# Patient Record
Sex: Male | Born: 1945
Health system: Southern US, Community
[De-identification: ages and names within clinical notes are randomized; demographics above are authoritative.]

## PROBLEM LIST (undated history)

## (undated) DIAGNOSIS — T8859XA Other complications of anesthesia, initial encounter: Secondary | ICD-10-CM

## (undated) DIAGNOSIS — Z8711 Personal history of peptic ulcer disease: Secondary | ICD-10-CM

## (undated) DIAGNOSIS — I7771 Dissection of carotid artery: Secondary | ICD-10-CM

## (undated) DIAGNOSIS — F4310 Post-traumatic stress disorder, unspecified: Secondary | ICD-10-CM

## (undated) DIAGNOSIS — M6208 Separation of muscle (nontraumatic), other site: Secondary | ICD-10-CM

## (undated) DIAGNOSIS — I3139 Other pericardial effusion (noninflammatory): Secondary | ICD-10-CM

## (undated) DIAGNOSIS — I714 Abdominal aortic aneurysm, without rupture, unspecified: Secondary | ICD-10-CM

## (undated) DIAGNOSIS — Z8719 Personal history of other diseases of the digestive system: Secondary | ICD-10-CM

## (undated) DIAGNOSIS — R059 Cough, unspecified: Secondary | ICD-10-CM

## (undated) DIAGNOSIS — I2510 Atherosclerotic heart disease of native coronary artery without angina pectoris: Secondary | ICD-10-CM

## (undated) DIAGNOSIS — K5730 Diverticulosis of large intestine without perforation or abscess without bleeding: Secondary | ICD-10-CM

## (undated) DIAGNOSIS — K219 Gastro-esophageal reflux disease without esophagitis: Secondary | ICD-10-CM

## (undated) DIAGNOSIS — M549 Dorsalgia, unspecified: Secondary | ICD-10-CM

## (undated) DIAGNOSIS — Z972 Presence of dental prosthetic device (complete) (partial): Secondary | ICD-10-CM

## (undated) DIAGNOSIS — E785 Hyperlipidemia, unspecified: Secondary | ICD-10-CM

## (undated) DIAGNOSIS — J439 Emphysema, unspecified: Secondary | ICD-10-CM

## (undated) DIAGNOSIS — R05 Cough: Secondary | ICD-10-CM

## (undated) DIAGNOSIS — T4145XA Adverse effect of unspecified anesthetic, initial encounter: Secondary | ICD-10-CM

## (undated) DIAGNOSIS — Z86010 Personal history of colonic polyps: Secondary | ICD-10-CM

## (undated) DIAGNOSIS — M810 Age-related osteoporosis without current pathological fracture: Secondary | ICD-10-CM

## (undated) DIAGNOSIS — F319 Bipolar disorder, unspecified: Secondary | ICD-10-CM

## (undated) DIAGNOSIS — F419 Anxiety disorder, unspecified: Secondary | ICD-10-CM

## (undated) DIAGNOSIS — Z8659 Personal history of other mental and behavioral disorders: Secondary | ICD-10-CM

## (undated) DIAGNOSIS — G8929 Other chronic pain: Secondary | ICD-10-CM

## (undated) DIAGNOSIS — M1990 Unspecified osteoarthritis, unspecified site: Secondary | ICD-10-CM

## (undated) DIAGNOSIS — I779 Disorder of arteries and arterioles, unspecified: Secondary | ICD-10-CM

## (undated) DIAGNOSIS — C189 Malignant neoplasm of colon, unspecified: Secondary | ICD-10-CM

## (undated) HISTORY — DX: Disorder of arteries and arterioles, unspecified: I77.9

## (undated) HISTORY — DX: Gastro-esophageal reflux disease without esophagitis: K21.9

## (undated) HISTORY — DX: Separation of muscle (nontraumatic), other site: M62.08

## (undated) HISTORY — DX: Dissection of carotid artery: I77.71

## (undated) HISTORY — PX: COLONOSCOPY: SHX174

## (undated) HISTORY — PX: BACK SURGERY: SHX140

## (undated) HISTORY — DX: Age-related osteoporosis without current pathological fracture: M81.0

## (undated) HISTORY — DX: Other pericardial effusion (noninflammatory): I31.39

## (undated) HISTORY — PX: APPENDECTOMY: SHX54

## (undated) HISTORY — DX: Atherosclerotic heart disease of native coronary artery without angina pectoris: I25.10

## (undated) HISTORY — DX: Hyperlipidemia, unspecified: E78.5

## (undated) HISTORY — DX: Abdominal aortic aneurysm, without rupture, unspecified: I71.40

---

## 2000-07-11 ENCOUNTER — Inpatient Hospital Stay (HOSPITAL_COMMUNITY): Admission: EM | Admit: 2000-07-11 | Discharge: 2000-07-12 | Payer: Self-pay | Admitting: Emergency Medicine

## 2000-07-11 ENCOUNTER — Encounter: Payer: Self-pay | Admitting: Emergency Medicine

## 2002-01-13 ENCOUNTER — Ambulatory Visit (HOSPITAL_COMMUNITY): Admission: RE | Admit: 2002-01-13 | Discharge: 2002-01-13 | Payer: Self-pay | Admitting: Gastroenterology

## 2002-01-13 ENCOUNTER — Encounter: Payer: Self-pay | Admitting: Gastroenterology

## 2003-02-01 ENCOUNTER — Encounter: Payer: Self-pay | Admitting: Family Medicine

## 2003-02-01 ENCOUNTER — Ambulatory Visit (HOSPITAL_COMMUNITY): Admission: RE | Admit: 2003-02-01 | Discharge: 2003-02-01 | Payer: Self-pay | Admitting: Family Medicine

## 2003-04-17 ENCOUNTER — Encounter: Payer: Self-pay | Admitting: Neurosurgery

## 2003-04-18 ENCOUNTER — Encounter: Payer: Self-pay | Admitting: Neurosurgery

## 2003-04-18 ENCOUNTER — Inpatient Hospital Stay (HOSPITAL_COMMUNITY): Admission: RE | Admit: 2003-04-18 | Discharge: 2003-04-19 | Payer: Self-pay | Admitting: Neurosurgery

## 2003-04-18 HISTORY — PX: ANTERIOR CERVICAL DECOMP/DISCECTOMY FUSION: SHX1161

## 2004-06-04 ENCOUNTER — Ambulatory Visit (HOSPITAL_COMMUNITY): Admission: RE | Admit: 2004-06-04 | Discharge: 2004-06-04 | Payer: Self-pay | Admitting: Gastroenterology

## 2004-06-04 ENCOUNTER — Encounter (INDEPENDENT_AMBULATORY_CARE_PROVIDER_SITE_OTHER): Payer: Self-pay | Admitting: Specialist

## 2004-10-22 ENCOUNTER — Ambulatory Visit: Payer: Self-pay

## 2005-03-05 ENCOUNTER — Ambulatory Visit (HOSPITAL_COMMUNITY): Admission: RE | Admit: 2005-03-05 | Discharge: 2005-03-05 | Payer: Self-pay | Admitting: Urology

## 2005-03-05 ENCOUNTER — Ambulatory Visit (HOSPITAL_BASED_OUTPATIENT_CLINIC_OR_DEPARTMENT_OTHER): Admission: RE | Admit: 2005-03-05 | Discharge: 2005-03-05 | Payer: Self-pay | Admitting: Urology

## 2005-03-05 HISTORY — PX: CIRCUMCISION: SUR203

## 2005-09-03 ENCOUNTER — Emergency Department (HOSPITAL_COMMUNITY): Admission: EM | Admit: 2005-09-03 | Discharge: 2005-09-03 | Payer: Self-pay | Admitting: Emergency Medicine

## 2005-09-06 ENCOUNTER — Ambulatory Visit (HOSPITAL_COMMUNITY): Admission: RE | Admit: 2005-09-06 | Discharge: 2005-09-06 | Payer: Self-pay | Admitting: Orthopaedic Surgery

## 2007-12-14 ENCOUNTER — Emergency Department (HOSPITAL_COMMUNITY): Admission: EM | Admit: 2007-12-14 | Discharge: 2007-12-14 | Payer: Self-pay | Admitting: Emergency Medicine

## 2008-07-04 ENCOUNTER — Encounter: Admission: RE | Admit: 2008-07-04 | Discharge: 2008-07-04 | Payer: Self-pay | Admitting: Orthopaedic Surgery

## 2009-06-03 ENCOUNTER — Emergency Department (HOSPITAL_COMMUNITY): Admission: EM | Admit: 2009-06-03 | Discharge: 2009-06-03 | Payer: Self-pay | Admitting: Emergency Medicine

## 2011-05-02 NOTE — Op Note (Signed)
NAME:  Matthew [name], Matthew [name]                         ACCOUNT NO.:  [id number]   MEDICAL RECORD NO.:  [id number]                   PATIENT TYPE:  AMB   LOCATION:  ENDO                                 FACILITY:  [facility]   PHYSICIAN:  John C. Madilyn Fireman, M.D.                 DATE OF BIRTH:  1946/08/24   DATE OF PROCEDURE:  06/04/2004  DATE OF DISCHARGE:                                 OPERATIVE REPORT   PROCEDURE:  Colonoscopy.   INDICATIONS FOR PROCEDURE:  Average-risk colon cancer screening.   DESCRIPTION OF PROCEDURE:  The patient was placed in the left lateral  decubitus position and placed on the pulse monitor with continuous low-flow  oxygen delivered by nasal cannula.  He was sedated with 75 mcg IV fentanyl  and 6 mg IV Versed.  The Olympus video colonoscope was inserted into the  rectum and advanced to the cecum, confirmed by transillumination of  McBurney's point and visualization of the ileocecal valve and appendiceal  orifice.  Prep was excellent.  The cecum and ascending colon appeared normal  with no masses, polyps, diverticula, or other mucosal abnormalities.  Within  the transverse, descending and sigmoid colon, there was seen a total of five  polyps, all of which were sessile ranging in size from 0.5 to 1.3 cm and  these were removed by both snare and hot biopsy.  There were also numerous  scattered sigmoid diverticula.  The rectum appeared normal and retroflexed  view of the anus revealed no obvious internal hemorrhoids.  The scope was  then withdrawn and the patient returned to the recovery room in stable  condition.  He tolerated the procedure well and there were no immediate  complications.   IMPRESSION:  1. Transverse descending sigmoid colon polyps.  2. Diverticulosis.   PLAN:  Await histology and we will probably repeat colonoscopy in three  years.  Manson Passey __________ [facility].                                               John C. Madilyn Fireman, M.D.    JCH/MEDQ  D:   06/04/2004  T:  06/04/2004  Job:  [id number]

## 2011-05-02 NOTE — Op Note (Signed)
[name], Matthew [name]               ACCOUNT NO.:  [id number]   MEDICAL RECORD NO.:  [id number]          PATIENT TYPE:  AMB   LOCATION:  [facility]                         FACILITY:  [facility]   PHYSICIAN:  Maretta Bees. Vonita Moss, M.D.DATE OF BIRTH:  10-28-1946   DATE OF PROCEDURE:  03/05/2005  DATE OF DISCHARGE:                                 OPERATIVE REPORT   PREOPERATIVE DIAGNOSIS:  Recurrent balanoposthitis.   POSTOPERATIVE DIAGNOSIS:  Recurrent balanoposthitis.   PROCEDURE:  Circumcision.   SURGEON:  Maretta Bees. Vonita Moss, M.D.   ANESTHESIA:  General.   INDICATIONS:  This is a 65 year old gentleman who has had several months of  cracking and splitting of the foreskin, recurrent yeast infections.  Was  seen by me and recommended that he have a circumcision to prevent this  recurrent problem.  He was advised about the risk of bleeding and restricted  sexual activity until the wound is healed.   PROCEDURE:  The patient is brought to the operating room and placed in  supine position.  The external genitalia were prepped and draped in the  usual fashion.  Circumcision is performed using the sleeve technique.  After  the foreskin was removed, Marcaine was injected under the residual skin  shaft proximally.  Bleeders were coagulated with electrocautery.  The  frenular area was closed with running 4-0 chromic cat gut.  The distal edge  of the skin of the shaft was reapproximated at the residual edge of mucosal  foreskin at 3, 6, 9, and 12 o'clock with 4-0 chromic cat gut and running 4-0  chromic cat gut was placed between these quadrant sutures.  The wound was  cleaned and dressed with Vaseline gauze dry sterile gauze dressings and  Coban.  He was taken to the recovery room in good condition, having  tolerated the procedure well.      LJP/MEDQ  D:  03/05/2005  T:  03/05/2005  Job:  [id number]   cc:   Olena Leatherwood [facility]

## 2011-05-02 NOTE — Consult Note (Signed)
Weaver. Matthew [name]  Patient:    Matthew [name], Matthew [name]                      MRN: [id number] Proc. Date: 07/11/00 Adm. Date:  [id number] Disc. Date: [id number] Attending:  Mickie Hillier CC:         Matthew [name], M.D.                          Consultation Report  REASON FOR CONSULTATION:  Chest pain.  IMPRESSION: 1. Atypical angina, possible unstable angina (spontaneous chest tightness,    nonexertional). 2. Tobacco abuse, middle-aged male. 3. History of hypercholesterolemia "diet" treated. 4. Status post lumbar spine surgery x 3. 5. History of peptic ulcer disease treated 12 years ago. 6. Diverticulitis.  RECOMMENDATIONS: 1. Aspirin four 81 mg tablets chewable now, then coated 325 mg p.o. q.d. 2. ECG with any chest pain. 3. Begin Lovenox and IV nitroglycerin if patient develops recurrent pain. 4. Agree with high-dose Prevacid. 5. If develops recurrent pain with significant ECG changes indicative of    ischemia and/or elevated CK-MB/troponin will proceed with catheterization. 6. Otherwise will undergo exercise stress testing +/- myocardial perfusion    ______.  FINDINGS:  This gentleman is a 65 year old white male who has had episodes of anterior chest tightness, spontaneous in nature occurring every day or every other day for the past month.  Today he has had three such episodes.  They are nonexertional.  They are associated with mild dyspnea, diaphoresis and subsequently weakness.  He also has episodes of blurred vision associated with these and some cephalgia.  He states "I feel like I am going to pass out." Earlier today he went to the [name] where an ECG and chest x-ray were unremarkable.  He was referred to [facility] Emergency Room and is now being admitted for further evaluation.  There does not appear to be any objective evidence at this point of coronary ischemia.  PAST MEDICAL HISTORY:  As above.  MEDICATIONS:  None.  DRUG  ALLERGIES:  CODEINE causes GI distress, IBUPROFEN causes urticaria.  FAMILY HISTORY:  Mother died age 71, had congestive failure.  One sister had "some sort of heart problem."  Father died in 58.  He has another sister whose health he is unaware of.  SOCIAL HISTORY:  He smokes a pack of cigarettes daily.  Denies any ethanol abuse.  He is a [employer].  He is accompanied in the emergency room this evening with his wife.  REVIEW OF SYSTEMS:  No recent fever, chills, weight loss, weight gain, malaise.  Denies headache except as above.  No visual changes except as above. He has hearing in both ears.  No tinnitus.  He has all his own teeth.  No difficulty with swallowing.  No frequent sore throat.  No cough or hemoptysis. Denies any problem with wheezing.  He has not had orthopnea, PND or lower extremity edema.  No tachy palpitations or syncope.  He denies any melena, hematochezia, hematemesis.  No chronic abdominal pain.  He denies any dysuria, hematuria, or nocturia.  No difficulty starting or stopping his stream.  No pain in the major joints.  He does have occasionally mechanical low-back pain. No muscle weakness.  No dysesthesia or paresthesia.  He has never had a seizure or never been treated for any emotional disorder.  PHYSICAL EXAMINATION:  VITAL SIGNS:  The blood pressure is 148/88,  pulse 66 and regular, respirations 16, temperature 97.1.  O2 saturation 98% on room air.  GENERAL:  In general this is a 65 year old, pleasant white male in no acute distress.  HEENT:  Unremarkable.  The head is normocephalic and atraumatic.  Pupils, equal, round, reactive to light and accommodation.  Extraocular movements intact.  Sclerae nonicteric.  Tympanic membranes are intact.  Oral mucosa is pink and moist.  The tongue is not coated.  NECK:  Supple without thyromegaly or masses.  The carotid upstrokes are normal.  There is no bruit.  There is no jugular venous distention.  CHEST:   Clear with adequate excursion bilaterally.  Fascicular breath sounds are heard throughout.  The precordium is quiet.  Normal S1 and S2.  No S3, S4, murmur, click or rub noted.  The point of maximum intensity is not palpable.  ABDOMEN:  Soft, flat, and nontender.  No hepatosplenomegaly or midline pulsatile masses.  Bowel sounds are present in all quadrants.  GENITALIA:  Normal male phallus.  Descended testicles.  No lesions.  RECTAL:  Examination not performed.  EXTREMITIES:  Full range of motion.  No edema.  Intact distal pulses.  No bruits noted.  SKIN:  Warm, dry and clear.  NEUROLOGICAL:  Cranial nerves II-XII are intact.  Motor and sensory are grossly intact.  Gait is not tested.  ACCESSORY CLINICAL DATA:  Electrocardiogram shows sinus rhythm and a T wave inversion in aVL.  No prior tracing for comparison.  Chest x-ray is unremarkable.  Troponins less than 0.03.  CK is 109, MB 1.3.  Alcohol level less than 10. Urinalysis normal.  PTT is 27, PT 12.2 with an INR of 0.9.  Serum electrolytes, BUN, creatinine, and glucose are normal as are liver associated enzymes, albumin and calcium.  Hemoglobin is 13.6, hematocrit 36.9, white blood count 10,600, platelets 192,000.  COMMENTS:  At this point it is difficult to predict the etiology of Matthew [name] chest pain syndrome.  His symptoms are atypical but he does have multiple risk factors.  Certainly agree with the need for admission and rule out myocardial infarction.  Would be useful to have an ECG with an episode of this chest discomfort.  That would drive the diagnostic testing toward a more invasive evaluation.  If no further episodes of chest discomfort and no evidence of coronary ischemia on above studies, would proceed with exercise stress testing as noted above.  Thank you very much for allowing me to assist in the care of Matthew [name].  It has been a pleasure to do so.  I will discuss his further care with you. DD:   07/11/00 TD:  07/13/00 Job: [id number]

## 2011-05-02 NOTE — H&P (Signed)
[place]  Patient:    Matthew [name], Matthew [name]                      MRN: [id number] Adm. Date:  [id number] Attending:  Mickie Hillier                         History and Physical  DIAGNOSES: 1. Chest pain, rule out angina. 2. Tobacco abuse. 3. History of peptic ulcer disease. 4. Codeine and ibuprofen allergies.  HISTORY OF PRESENT ILLNESS:  Matthew [name] is a 65 year old white male admitted with chest pain.  He was in his usual state of health until about a month prior to admission.  He was having "chest pain episodes" each morning lasting for seconds or less than a minute.  Today, he had three episodes-one during lunch, at rest.  It is an anterior chest wall "tightness" associated with a feeling of faintness, last less than a minute, some diaphoresis present, nonexertional, some nausea, but no vomiting.  Cardiac risks factors of smoking a pack a day for over 30 years, history of elevated cholesterol in the past, and family history of mother dying of heart problems with CHF at age 26 and sister having a history of "heart problems" of some type.  The patient does not have diabetes or hypertension.  The patient went to the [facility] and then was sent to [facility] Emergency Room for evaluation.  Dr. Francisca December was called to see the patient and deferred admission to our practice, agreeing to consult to see patient tonight.  PAST MEDICAL/SURGICAL HISTORY:  Lumbar spine surgery x 3.  History of peptic ulcer disease about 12 years ago.  Dr. Everardo All. Madilyn Fireman performed upper endoscopy at that time.  Pneumonia at age 71.  Illness of diverticulitis.  History of tobacco abuse.  History of peptic ulcer disease.  CURRENT MEDICATIONS:  None.  ALLERGIES:  Allergy to IBUPROFEN with rash and CODEINE.  FAMILY HISTORY:  Mother died at age 39 of CHF.  Father died in MVA at age 67. Two sisters one with "undefined heart  problems."  SOCIAL HISTORY:  Married.  Drives a truck for employment.  Denies alcohol use.  Smoked a pack a day for over 30 years.  REVIEW OF SYSTEMS:  Denies history of diabetes, thyroid disease, kidney, liver, or prostate disease.  No regular aspirin use.  PHYSICAL EXAMINATION:  VITAL SIGNS:  Blood pressure 147/79, temperature 97.0.  O2 saturation 97% on room air.  GENERAL:  Alert and oriented x 3.  HEENT:  Normocephalic and atraumatic.  EOMI.  PERRLA.  Posterior pharynx clear.  The rest of the exam was clear.  HEART:  RRR.  Normal S1 and S2 without murmur, gallop, rub, or click.  ABDOMEN:  Soft, benign, nontender.  No rigidity, guarding, rebound.  No hepatosplenomegaly.  GENITAL:  Normal male.  EXTREMITIES:  Without CCE.  NEUROLOGICAL:  Intact.  SKIN:  Warm and dry.  RECTAL:  Guaiac negative per doctor in the [facility] tonight.  LABORATORY DATA:  Hemoglobin 13.6, white count 10.6.  Sodium 129, potassium 4.1, BUN 18, creatinine 1.2, glucose 104.  Liver functions normal.  Urinalysis negative pending cardiac enzymes.  EKG shows normal sinus rhythm and rate at 69, normal axis, no acute changes. Chest x-ray shows no acute disease.  ASSESSMENT: 1. Chest pain, rule out angina and coronary artery disease.  Check cardiac  enzymes.  Dr. Francisca December to see the patient for cardiology consult.    Monitor bed.  If recurrent pain, consider intravenous nitroglycerin and    heparin.  We will hold off on heparin for now with the patients    history of peptic ulcer disease in the distant past as he is not having    pain currently. 2. Tobacco abuse. 3. History of peptic ulcer disease.  Start on Prevacid and Carafate.  If    cardiac disease is ruled out and pain is unresolved, may need to consider    upper endoscopy to be repeated. 4. Codeine and ibuprofen on chart as marked. DD:  07/11/00 TD:  07/12/00 Job: [id number]

## 2011-05-02 NOTE — Op Note (Signed)
NAME:  Matthew [name], Matthew [name]                         ACCOUNT NO.:  [id number]   MEDICAL RECORD NO.:  [id number]                   PATIENT TYPE:  INP   LOCATION:  3005                                 FACILITY:  [facility]   PHYSICIAN:  Clydene Fake, M.D.               DATE OF BIRTH:  December 27, 1945   DATE OF PROCEDURE:  04/18/2003  DATE OF DISCHARGE:                                 OPERATIVE REPORT   PREOPERATIVE DIAGNOSIS:  Herniated nucleus pulposus, spondylosis at C6-7,  with left-sided radiculopathy.   POSTOPERATIVE DIAGNOSIS:  Herniated nucleus pulposus, spondylosis at C6-7,  with left-sided radiculopathy.   PROCEDURE:  Anterior cervical decompression, diskectomy, and fusion at C6-7  with BAK-C cages and autograft through the same incision at C6-7.   SURGEON:  Clydene Fake, M.D.   ASSISTANT:  Payton Doughty, M.D.   ANESTHESIA:  General endotracheal tube anesthesia.   ESTIMATED BLOOD LOSS:  Minimal.   BLOOD REPLACED:  None.   DRAINS:  None.   COMPLICATIONS:  None.   INDICATION FOR PROCEDURE:  The patient is a 65 year old gentleman who has  had neck and left arm pain, numbness, and weakness, found to have some  weakness in the left triceps and fair extension with decreased sensation in  the left 7 distribution.  MRI was done showing __________ 6-7, spondylitic  changes and anterior spurring on the left side, disk herniation compressing  the 7 root.  Patient brought in for decompression and fusion.   DESCRIPTION OF PROCEDURE:  The patient was brought in the operating room and  general anesthesia induced.  The patient was placed in 10 pounds Holter  traction and prepped and draped in a sterile fashion.  The site of incision  was injected with 10 mL of 1% lidocaine with epinephrine.  The incision was  then made from the midline to the anterior border of the sternocleidomastoid  muscle on the left side of the neck, incision taken down to the platysma.  Hemostasis was obtained with  Bovie cauterization, then the Bovie was used to  open the platysma, and blunt dissection was taken through the anterior  cervical fascia to the anterior cervical spine.  A needle was placed in a  disk space and fluoroscopy was used, confirming the 6-7 level.  The disk  space was incised with a 15 blade and partial diskectomy performed.  As the  needle was removed, the longus colli muscle was reflected laterally and  incised using the Bovie and the self-retaining retractor was placed.  Distraction pins were placed in the C6 and C7 interspace.  Distracted  osteophytes were removed with Leksell rongeur and diskectomy was performed  with pituitary rongeurs and curettes and 1 and 2 mm punches.  Bilateral  foraminotomies of that root performed.  The left one was significantly  compressed down.  This was opened up.  When we were finished, we had good  decompression of the central canal and bilateral roots.  We then measured  the interspace at 9 mm, and a 9 mm drill guide for a 12 mm BAK-C cage was  then placed into and over the disk space.  We used fluoroscopic imaging to  show we had the proper orientation and we then drilled into the interspace,  saving all bone fragments and packing them into a 12 mm BAK-C cage, a small  remnant of bone posteriorly.  All bone was packed in the cage.  The hole was  tapped.  The cage was then threaded into the interspace.  The drill tip was  removed and we counter sunk it a couple of millimeters.  We checked behind  the cage, and there was plenty of room between the cage and dura.  The wound  was irrigated with antibiotic solution, hemostasis was obtained with Gelfoam  and thrombin and bipolar cauterization.  Gelfoam was irrigated out.  The  cage was in good position.  Fl uroscopic imaging was also used to assure it  was in the 6 interspace in good position.  The retractors were removed and  hemostasis obtained with bipolar cauterization and Gelfoam and  thrombin,  Gelfoam irrigated out.  The platysma was closed with 3-0 Vicryl interrupted  suture, the suture closed with the same, the skin closed with Benzoin and  Steri-Strips.  A dressing was placed.  The patient was placed into a soft  cervical collar, awoken from anesthesia, and transferred to the recovery  room in stable condition.                                               Clydene Fake, M.D.    JRH/MEDQ  D:  04/18/2003  T:  04/18/2003  Job:  [id number]

## 2011-05-02 NOTE — Procedures (Signed)
[place]  Patient:    Matthew [name], Matthew [name] Visit Number: [id number] MRN: [id number]          Service Type: END Location: ENDO Attending Physician:  Louie Bun Dictated by:   Everardo All Madilyn Fireman, M.D. Proc. Date: 01/13/02 Admit Date:  01/13/2002   CC:         Elvina Sidle, M.D.   Procedure Report  PROCEDURE:  Esophagogastroduodenoscopy with esophageal dilatation.  INDICATION FOR PROCEDURE:  Solid food dysphagia suggestive of an esophageal stricture.  DESCRIPTION OF PROCEDURE:  The patient was placed in the left lateral decubitus position and placed on the pulse monitor with continuous low-flow oxygen delivered by nasal cannula.  He was sedated with 50 mg IV Demerol and 7.5 mg IV Versed.  The Olympus video endoscope was advanced under direct vision into the oropharynx and the esophagus.  The esophagus was straight and of normal caliber with the squamocolumnar line at 38 cm.  There was felt to be a mild stricture at the GE junction, although this was fairly subtle in appearance.  There was no resistance to passage of the scope beyond it.  There was no visible esophagitis or hiatal hernia.  The stomach was entered, and a small amount of liquid secretions were suctioned from the fundus.  Retroflex view of the cardia was unremarkable.  The fundus, body, antrum, and pylorus all appeared normal.  The duodenum was entered, and both the bulb and second portion were well-inspected and appeared to be within normal limits.  The wire was placed through the endoscope channel and the scope withdrawn.  Savary dilators of 16 and 17 mm were passed consecutively under fluoroscopic visualization.  The last dilator was removed together with the wire.  The patient returned to the recovery room in stable condition.  He tolerated the procedure well, and there were no intraoperative complications.   There was no blood seen on withdrawal of either  dilator.  IMPRESSION:  Subtle esophageal stricture, dilated to 17 mm.  PLAN:  Advance diet and observe response to dilatation. Dictated by:   Everardo All Madilyn Fireman, M.D. Attending Physician:  Louie Bun DD:  01/13/02 TD:  01/13/02 Job: [id number]

## 2012-07-26 ENCOUNTER — Encounter (HOSPITAL_COMMUNITY): Payer: Self-pay

## 2012-07-26 ENCOUNTER — Emergency Department (HOSPITAL_COMMUNITY)
Admission: EM | Admit: 2012-07-26 | Discharge: 2012-07-26 | Disposition: A | Discharge status: Home / Self Care | Payer: Medicare Other | Attending: Emergency Medicine | Admitting: Emergency Medicine

## 2012-07-26 ENCOUNTER — Other Ambulatory Visit: Payer: Self-pay

## 2012-07-26 DIAGNOSIS — T63441A Toxic effect of venom of bees, accidental (unintentional), initial encounter: Secondary | ICD-10-CM

## 2012-07-26 DIAGNOSIS — I10 Essential (primary) hypertension: Secondary | ICD-10-CM | POA: Diagnosis not present

## 2012-07-26 DIAGNOSIS — Z8739 Personal history of other diseases of the musculoskeletal system and connective tissue: Secondary | ICD-10-CM | POA: Insufficient documentation

## 2012-07-26 DIAGNOSIS — R6889 Other general symptoms and signs: Secondary | ICD-10-CM | POA: Diagnosis not present

## 2012-07-26 DIAGNOSIS — I498 Other specified cardiac arrhythmias: Secondary | ICD-10-CM | POA: Diagnosis not present

## 2012-07-26 DIAGNOSIS — T782XXA Anaphylactic shock, unspecified, initial encounter: Secondary | ICD-10-CM | POA: Diagnosis not present

## 2012-07-26 DIAGNOSIS — F411 Generalized anxiety disorder: Secondary | ICD-10-CM | POA: Diagnosis not present

## 2012-07-26 DIAGNOSIS — T6391XA Toxic effect of contact with unspecified venomous animal, accidental (unintentional), initial encounter: Secondary | ICD-10-CM | POA: Insufficient documentation

## 2012-07-26 DIAGNOSIS — T63461A Toxic effect of venom of wasps, accidental (unintentional), initial encounter: Secondary | ICD-10-CM | POA: Insufficient documentation

## 2012-07-26 DIAGNOSIS — G8929 Other chronic pain: Secondary | ICD-10-CM | POA: Insufficient documentation

## 2012-07-26 HISTORY — DX: Unspecified osteoarthritis, unspecified site: M19.90

## 2012-07-26 HISTORY — DX: Anxiety disorder, unspecified: F41.9

## 2012-07-26 MED ORDER — SODIUM CHLORIDE 0.9 % IV BOLUS (SEPSIS)
1000.0000 mL | Freq: Once | INTRAVENOUS | Status: AC
  Administered 2012-07-26: 1000 mL via INTRAVENOUS

## 2012-07-26 MED ORDER — DIPHENHYDRAMINE HCL 25 MG PO CAPS: 25.0000 mg | ORAL_CAPSULE | Freq: Three times a day (TID) | ORAL | Status: DC

## 2012-07-26 MED ORDER — METHYLPREDNISOLONE SODIUM SUCC 125 MG IJ SOLR
125.0000 mg | Freq: Once | INTRAMUSCULAR | Status: AC
  Administered 2012-07-26: 125 mg via INTRAVENOUS

## 2012-07-26 MED ORDER — SODIUM CHLORIDE 0.9 % IV BOLUS (SEPSIS): 1000.0000 mL | Freq: Once | INTRAVENOUS | Status: DC

## 2012-07-26 MED ORDER — ONDANSETRON HCL 4 MG/2ML IJ SOLN
4.0000 mg | Freq: Once | INTRAMUSCULAR | Status: AC
  Administered 2012-07-26: 4 mg via INTRAVENOUS
  Filled 2012-07-26: qty 2

## 2012-07-26 MED ORDER — FAMOTIDINE IN NACL 20-0.9 MG/50ML-% IV SOLN
20.0000 mg | Freq: Once | INTRAVENOUS | Status: AC
  Administered 2012-07-26: 20 mg via INTRAVENOUS
  Filled 2012-07-26: qty 50

## 2012-07-26 MED ORDER — FAMOTIDINE 20 MG PO TABS: 20.0000 mg | ORAL_TABLET | Freq: Two times a day (BID) | ORAL | Status: DC

## 2012-07-26 MED ORDER — EPINEPHRINE 0.3 MG/0.3ML IJ DEVI
INTRAMUSCULAR | Status: AC
  Administered 2012-07-26: 0.3 mg
  Filled 2012-07-26: qty 0.3

## 2012-07-26 MED ORDER — EPINEPHRINE 0.3 MG/0.3ML IJ DEVI: 0.3000 mg | Freq: Once | INTRAMUSCULAR | Status: DC

## 2012-07-26 MED ORDER — METHYLPREDNISOLONE SODIUM SUCC 125 MG IJ SOLR
INTRAMUSCULAR | Status: AC
  Filled 2012-07-26: qty 2

## 2012-07-26 MED ORDER — DIPHENHYDRAMINE HCL 50 MG/ML IJ SOLN
25.0000 mg | Freq: Once | INTRAMUSCULAR | Status: AC
  Administered 2012-07-26: 25 mg via INTRAVENOUS
  Filled 2012-07-26: qty 1

## 2012-07-26 NOTE — ED Notes (Signed)
Patient states he doesn't feel well - generalized weakness, pain in thigh where he gave himself and Epi-pen injection prior to coming to ED.  MD in to re-evaluate.

## 2012-07-26 NOTE — ED Provider Notes (Signed)
History     CSN: [id number]  Arrival date & time 07/26/12  1722   First MD Initiated Contact with Patient 07/26/12 1735      Chief Complaint  Patient presents with  . Allergic Reaction    (Consider location/radiation/quality/duration/timing/severity/associated sxs/prior treatment) HPI The patient presents in extremis after being stung by several yellow jackets, do to his history of anaphylaxis.  Initially the patient cannot provide any details of the history of present illness.  Per report the patient was stung at least a handful of times, soon thereafter became diaphoretic, dyspneic.  The patient identity use his own EpiPen, but it was expired.  He separately received a neighbor EpiPen injection, and was brought for evaluation.  On my initial valuation the patient is in distress, diaphoretic, dyspneic, coughing and using accessory muscles. After initial interventions the patient clarifies that he was stung approximately 6 times, and his knee to his neck, his chest, his abdomen, his thigh. Past Medical History  Diagnosis Date  . Arthritis   . Anxiety   . Panic attack   . Chronic pain   . Hypertension     History reviewed. No pertinent past surgical history.  No family history on file.  History  Substance Use Topics  . Smoking status: Never Smoker   . Smokeless tobacco: Not on file  . Alcohol Use: No      Review of Systems  All other systems reviewed and are negative.    Allergies  Bee venom  Home Medications  No current outpatient prescriptions on file.  BP 119/70  Pulse 95  Temp 98.1 F (36.7 C) (Oral)  Resp 20  Ht 5\' 10"  (1.778 m)  Wt 175 lb (79.379 kg)  BMI 25.11 kg/m2  SpO2 98%  Physical Exam  Nursing note and vitals reviewed. Constitutional: He is oriented to person, place, and time. He appears distressed.  HENT:  Head: Normocephalic and atraumatic. Head is without right periorbital erythema and without left periorbital erythema.  Mouth/Throat:  Mucous membranes are normal.       Initially for possible to get the patient to cooperate to do an oral pharyngeal exam  Eyes: Conjunctivae and EOM are normal. Pupils are equal, round, and reactive to light.  Cardiovascular: Regular rhythm.  Tachycardia present.   Pulmonary/Chest: Accessory muscle usage present. Apnea and tachypnea noted. He is in respiratory distress. He has rales.  Abdominal: Soft. He exhibits no distension.  Musculoskeletal: He exhibits no edema and no tenderness.  Neurological: He is alert and oriented to person, place, and time. No cranial nerve deficit. He exhibits normal muscle tone. Coordination normal.  Skin: Skin is warm. He is diaphoretic.  Psychiatric: He has a normal mood and affect.    ED Course  Procedures (including critical care time)  Labs Reviewed - No data to display No results found.   No diagnosis found.   immediately after the patient's arrival, with his persistent dyspnea, tachypnea, diaphoresis, he received Solu-Medrol, Pepcid, Benadryl, IV fluids, an additional dose of epinephrine.   Re-eval - Patient improved, though uncomfortable.  7:27 PM The patient is much calmer, breath sounds are clear.  He c/o mild nausea.  Cardiac: 71sr, normal o2 100% [place], abnormal   Date: 07/26/2012 (ARRIVAL)  Rate: 108  Rhythm: sinus tachycardia  QRS Axis: normal  Intervals: normal  ST/T Wave abnormalities: nonspecific T wave changes  Conduction Disutrbances:none  Narrative Interpretation:   Old EKG Reviewed: none available ABNORMAL   9:40 PM (Patient re-assessed ~16min ago)  Patient calm, no resp complaints.  MDM  This gentleman presents in extremis after being stung by several bees.  Notably, the patient has a severe allergy to bee stings, and his initial presentation is consistent with anaphylaxis.  The patient is tachycardic, tachypneic, hypoxic, in no distress.  The patient had already received epinephrine, but received another dose of that  here after his respiratory distress seemed to progress.  The patient received steroids, fluids, Benadryl, Pepcid.  Following these medications, the patient improved substantially over hours.  Following a prolonged ED observation, the patient was discharged in stable condition with return precautions, provision of an EpiPen, instructions to carry 1 at all times.  Discharged into the care of his wife and daughter, and EMT.  CRITICAL CARE Performed by: Gerhard Munch   Total critical care time: 35  Critical care time was exclusive of separately billable procedures and treating other patients.  Critical care was necessary to treat or prevent imminent or life-threatening deterioration.  Critical care was time spent personally by me on the following activities: development of treatment plan with patient and/or surrogate as well as nursing, discussions with consultants, evaluation of patient's response to treatment, examination of patient, obtaining history from patient or surrogate, ordering and performing treatments and interventions, ordering and review of laboratory studies, ordering and review of radiographic studies, pulse oximetry and re-evaluation of patient's condition.        Gerhard Munch, MD 07/26/12 2142

## 2012-07-26 NOTE — ED Notes (Signed)
Pt began gasping for air, pt screaming "i can't breath".  notfied edp, was given v/o for epi pen.  Administered to pt.  Pt calmer. nad noted

## 2012-07-26 NOTE — ED Notes (Signed)
Was out in yard and got bitten by several yellow jackets, trid to use his own epi-pen but was outdated.

## 2013-07-01 DIAGNOSIS — T6391XA Toxic effect of contact with unspecified venomous animal, accidental (unintentional), initial encounter: Secondary | ICD-10-CM | POA: Diagnosis not present

## 2013-12-15 HISTORY — PX: UPPER GI ENDOSCOPY: SHX6162

## 2014-03-29 ENCOUNTER — Other Ambulatory Visit: Payer: Self-pay | Admitting: Gastroenterology

## 2014-03-29 DIAGNOSIS — K439 Ventral hernia without obstruction or gangrene: Secondary | ICD-10-CM | POA: Diagnosis not present

## 2014-03-29 DIAGNOSIS — R1011 Right upper quadrant pain: Secondary | ICD-10-CM

## 2014-03-29 DIAGNOSIS — R1013 Epigastric pain: Secondary | ICD-10-CM | POA: Diagnosis not present

## 2014-03-31 ENCOUNTER — Ambulatory Visit
Admission: RE | Admit: 2014-03-31 | Discharge: 2014-03-31 | Disposition: A | Discharge status: Home / Self Care | Payer: Medicare Other | Source: Ambulatory Visit | Attending: Gastroenterology | Admitting: Gastroenterology

## 2014-03-31 DIAGNOSIS — K7689 Other specified diseases of liver: Secondary | ICD-10-CM | POA: Diagnosis not present

## 2014-03-31 DIAGNOSIS — R1011 Right upper quadrant pain: Secondary | ICD-10-CM

## 2014-05-02 DIAGNOSIS — R1013 Epigastric pain: Secondary | ICD-10-CM | POA: Diagnosis not present

## 2014-05-02 DIAGNOSIS — Z86010 Personal history of colonic polyps: Secondary | ICD-10-CM | POA: Diagnosis not present

## 2014-05-30 ENCOUNTER — Other Ambulatory Visit: Payer: Self-pay | Admitting: Gastroenterology

## 2014-05-30 DIAGNOSIS — Z09 Encounter for follow-up examination after completed treatment for conditions other than malignant neoplasm: Secondary | ICD-10-CM | POA: Diagnosis not present

## 2014-05-30 DIAGNOSIS — C180 Malignant neoplasm of cecum: Secondary | ICD-10-CM | POA: Diagnosis not present

## 2014-05-30 DIAGNOSIS — R1013 Epigastric pain: Secondary | ICD-10-CM | POA: Diagnosis not present

## 2014-05-30 DIAGNOSIS — K5730 Diverticulosis of large intestine without perforation or abscess without bleeding: Secondary | ICD-10-CM | POA: Diagnosis not present

## 2014-05-30 DIAGNOSIS — Z86010 Personal history of colonic polyps: Secondary | ICD-10-CM | POA: Diagnosis not present

## 2014-06-02 ENCOUNTER — Ambulatory Visit
Admission: RE | Admit: 2014-06-02 | Discharge: 2014-06-02 | Disposition: A | Discharge status: Home / Self Care | Payer: Medicare Other | Source: Ambulatory Visit | Attending: Gastroenterology | Admitting: Gastroenterology

## 2014-06-02 DIAGNOSIS — C180 Malignant neoplasm of cecum: Secondary | ICD-10-CM

## 2014-06-02 DIAGNOSIS — N2889 Other specified disorders of kidney and ureter: Secondary | ICD-10-CM | POA: Diagnosis not present

## 2014-06-02 MED ORDER — IOHEXOL 300 MG/ML  SOLN
100.0000 mL | Freq: Once | INTRAMUSCULAR | Status: AC | PRN
  Administered 2014-06-02: 100 mL via INTRAVENOUS

## 2014-06-06 ENCOUNTER — Encounter (INDEPENDENT_AMBULATORY_CARE_PROVIDER_SITE_OTHER): Payer: Self-pay | Admitting: General Surgery

## 2014-06-06 ENCOUNTER — Ambulatory Visit (INDEPENDENT_AMBULATORY_CARE_PROVIDER_SITE_OTHER): Payer: Medicare Other | Admitting: General Surgery

## 2014-06-06 VITALS — BP 122/75 | HR 111 | Temp 98.0°F | Resp 16 | Ht 68.0 in | Wt 180.2 lb

## 2014-06-06 DIAGNOSIS — C189 Malignant neoplasm of colon, unspecified: Secondary | ICD-10-CM

## 2014-06-06 MED ORDER — METRONIDAZOLE 500 MG PO TABS: 500.0000 mg | ORAL_TABLET | ORAL | Status: AC

## 2014-06-06 NOTE — Patient Instructions (Signed)
[facility]  ONE-DAY (1) PRE-OP HOME COLON PREP INSTRUCTIONS: ** MIRALAX / GATORADE PREP / FLAGYL**  You must follow the instructions below carefully.  If you have questions or problems, please call and speak to someone in the clinic department at our office:   [phone].     INSTRUCTIONS: 1. Five days prior to your procedure do not eat nuts, popcorn, or fruit with seeds.  Stop all fiber supplements such as Metamucil, Citrucel, etc. 2. Two days before surgery fill the prescription at a pharmacy of your choice and purchase the additional supplies below.         MIRALAX - GATORADE -- DULCOLAX TABS:   Purchase a bottle of MIRALAX  (255 gm bottle)    In addition, purchase four (4) DULCOLAX TABLETS (no prescription required- ask the pharmacist if you can't find them)   Purchase one 64 oz GATORADE.  (Do NOT purchase red Gatorade; any other flavor is acceptable) and place in refrigerator to get cold.  3.   Day Before Surgery:   6 am: Wash you abdomen with soap and repeat this on the morning of surgery and take 4 Dulcolax tablets   You may only have clear liquids (tea, coffee, juice, broth, jello, soft drinks, gummy bears).  You cannot have solid foods, cream, milk or milk products.  Drink at lease 8 ounces of liquids every hour while awake.   Take the Flagyl prescription as directed at 8 am, 2 pm and 8 pm.  It is helpful to take this with some jello instead of on an empty stomach.  Any flavor is ok, except red jello, which will cause red stools.   Mix the entire bottle of MiraLax and the Gatorade in a large container.    10:00am: Begin drinking the Gatorade mixture until gone (8 oz every 15-30 minutes).      You may suck on a lime wedge or hard candy to "freshen your palate" in between glasses   If you are a diabetic, take your blood sugar reading several time throughout the prep.  Have some juice available to take if your sugar level gets too low   You may feel chilled while taking the  prep.  Have some warm tea or broth to help warm up.   Continue clear liquids until midnight or bedtime  3. The day of your procedure:   Do not eat or drink ANYTHING after midnight before your surgery.     If you take Heart or Blood Pressure medicine, ask the pre-op nurses about these during your preop appointment.   Further pre-operative instructions will be given to you from the hospital.   Expect to be contacted 5-7 days before your surgery.

## 2014-06-06 NOTE — Progress Notes (Signed)
Chief Complaint  Patient presents with  . Colon Cancer    HISTORY: Matthew [name] is a 68 y.o. male who presents to the office with a newly diagnosed colon cancer.  This was found on screening colonscopy.  He was undergoing a work up for abd pain and nausea.  He denies rectal bleeding.  He has had a 10lb weight loss over the past few weeks.  He reports chronic mid-epigastric pain with associated nausea.  He denies chest pain or SOB with activity.    Past Medical History  Diagnosis Date  . Arthritis   . Anxiety   . Panic attack   . Chronic pain   . Hypertension   . Cancer   . GERD (gastroesophageal reflux disease)   . Hyperlipidemia   . Osteoporosis   .        Past Surgical History  Procedure Laterality Date  . Back surgery  1983  . Neck surgery  2004      Current Outpatient Prescriptions  Medication Sig Dispense Refill  . cyclobenzaprine (FLEXERIL) 10 MG tablet       . EPINEPHrine (EPI-PEN) 0.3 mg/0.3 mL DEVI Inject 0.3 mLs (0.3 mg total) into the muscle once.  2 Device  2  . GAVILYTE-N WITH FLAVOR PACK 420 G solution       . lisinopril (PRINIVIL,ZESTRIL) 5 MG tablet       . omeprazole (PRILOSEC) 20 MG capsule       . oxyCODONE-acetaminophen (PERCOCET/ROXICET) 5-325 MG per tablet       . pravastatin (PRAVACHOL) 40 MG tablet       . diphenhydrAMINE (BENADRYL) 25 mg capsule Take 1 capsule (25 mg total) by mouth 3 (three) times daily.  6 capsule  0  . famotidine (PEPCID) 20 MG tablet Take 1 tablet (20 mg total) by mouth 2 (two) times daily.  4 tablet  0  . metroNIDAZOLE (FLAGYL) 500 MG tablet Take 1 tablet (500 mg total) by mouth as directed.  3 tablet  0   No current facility-administered medications for this visit.      Allergies  Allergen Reactions  . Ibuprofen Shortness Of Breath and Swelling  . Bee Venom       Family History  Problem Relation Age of Onset  . Heart disease Mother       History   Social History  . Marital Status: Married    Spouse Name: N/A     Number of Children: N/A  . Years of Education: N/A   Social History Main Topics  . Smoking status: Never Smoker   . Smokeless tobacco: None  . Alcohol Use: No  . Drug Use: No  . Sexual Activity: No   Other Topics Concern  . None   Social History Narrative  . None       REVIEW OF SYSTEMS - PERTINENT POSITIVES ONLY: Review of Systems - General ROS: negative for - chills or fever Respiratory ROS: no cough, shortness of breath, or wheezing Cardiovascular ROS: no chest pain or dyspnea on exertion Gastrointestinal ROS: pos for abdominal pain, no change in bowel habits, or black or bloody stools Genito-Urinary ROS: no dysuria, trouble voiding, or hematuria  EXAM: Filed Vitals:   06/06/14 0943  BP: 122/75  Pulse: 111  Temp: 98 F (36.7 C)  Resp: 16    Gen:  No acute distress.  Well nourished and well groomed.   Neurological: Alert and oriented to person, place, and time. Coordination normal.  Head:  Normocephalic and atraumatic.  Eyes: Conjunctivae are normal. Pupils are equal, round, and reactive to light. No scleral icterus.  Neck: Normal range of motion. Neck supple. No tracheal deviation or thyromegaly present.  No cervical lymphadenopathy. Cardiovascular: Normal rate, regular rhythm, normal heart sounds and intact distal pulses. Respiratory: Effort normal.  No respiratory distress. No chest wall tenderness. Breath sounds normal.  No wheezes, rales or rhonchi.  GI: Soft. Bowel sounds are normal. The abdomen is soft and nontender.  There is no rebound and no guarding. No hernia, rectus diastasis noted Musculoskeletal: Normal range of motion. Extremities are nontender.  Skin: Skin is warm and dry. No rash noted. No diaphoresis. No erythema. No pallor. No clubbing, cyanosis, or edema.   Psychiatric: Normal mood and affect. Behavior is normal. Judgment and thought content normal.     LABORATORY RESULTS: Available labs are reviewed   CEA: 192.2                                             06/01/14    RADIOLOGY RESULTS:   Images and reports are reviewed. CT CHEST ABD AND PELVIS IMPRESSION:  Irregular wall thickening in the medial cecum with soft tissue stranding in the pericecal fat and small adjacent pericecal lymph nodes. CT imaging features are concerning for transmural tumor extension with local metastatic involvement of lymph nodes.  17 mm low-density lesion in the right kidney has attenuation too high to allow classification as a simple cyst. This may be a cyst complicated by proteinaceous debris or hemorrhage. MRI without and with contrast could be used to more definitively characterize as clinically warranted. 4.8 cm exophytic lesion in the upper pole the left kidney has imaging features compatible with a cyst.     ASSESSMENT AND PLAN: Matthew [name] is a 68 y.o. M with newly diagnosed colon cancer found in his cecum on colonscopy.  His imaging suggest that there is local spread but no signs of obvious metastatic disease.  I have suggested that he undergo a laparoscopic right colectomy.  We discussed this in detail, including possible en bloc resection of any involved surrounding structures.  All questions were answered.  We will get this scheduled as soon as possible.    The surgery and anatomy were described to the patient as well as the risks of surgery and the possible complications.  These include: Bleeding, deep abdominal infections and possible wound complications such as hernia and infection, damage to adjacent structures, leak of surgical connections, which can lead to other surgeries and possibly an ostomy, possible need for other procedures, such as abscess drains in radiology, possible prolonged hospital stay, possible diarrhea from removal of part of the colon, possible constipation from narcotics, prolonged fatigue/weakness or appetite loss, possible early recurrence of of disease, possible complications of their medical problems such as heart  disease or arrhythmias or lung problems, death (less than 1%). I believe the patient understands and wishes to proceed with the surgery.    [name], MD Colon and Rectal Surgery / [facility]      Visit Diagnoses: 1. Colon cancer     Primary Care Physician: No primary provider on file.

## 2014-06-15 ENCOUNTER — Ambulatory Visit (INDEPENDENT_AMBULATORY_CARE_PROVIDER_SITE_OTHER): Payer: 59 | Admitting: Surgery

## 2014-06-21 ENCOUNTER — Encounter (INDEPENDENT_AMBULATORY_CARE_PROVIDER_SITE_OTHER): Payer: Self-pay

## 2014-06-22 ENCOUNTER — Other Ambulatory Visit (HOSPITAL_COMMUNITY): Payer: Self-pay | Admitting: *Deleted

## 2014-06-22 NOTE — Patient Instructions (Addendum)
Matthew [name]  06/22/2014                           YOUR PROCEDURE IS SCHEDULED ON: 06/30/14 AT 7:30 AM               ENTER [place] ENTRANCE AND                            FOLLOW  SIGNS TO SHORT STAY CENTER                 ARRIVE AT SHORT STAY AT: 5:30 AM               CALL THIS NUMBER IF ANY PROBLEMS THE DAY OF SURGERY :               [phone]                                REMEMBER:   Do not eat food or drink liquids AFTER MIDNIGHT   May have clear liquids UNTIL 6 HOURS BEFORE SURGERY               Take these medicines the morning of surgery with               A SIPS OF WATER :   CLONAZEPAM / CYCLOBENZAPRINE /   OMEPRAZOLE / OXYCODONE    Do not wear jewelry, make-up   Do not wear lotions, powders, or perfumes.   Do not shave legs or underarms 12 hrs. before surgery (men may shave face)  Do not bring valuables to the hospital.  Contacts, dentures or bridgework may not be worn into surgery.  Leave suitcase in the car. After surgery it may be brought to your room.  For patients admitted to the hospital more than one night, checkout time is            11:00 AM                                                      ________________________________________________________________________                                                                        Matthew [name]  Before surgery, you can play an important role.  Because skin is not sterile, your skin needs to be as free of germs as possible.  You can reduce the number of germs on your skin by washing with CHG (chlorahexidine gluconate) soap before surgery.  CHG is an antiseptic cleaner which kills germs and bonds with the skin to continue killing germs even after washing. Please DO NOT use if you have an allergy to CHG or antibacterial soaps.  If your skin becomes reddened/irritated stop using the CHG and inform your nurse when you arrive at Short Stay. Do not shave  (including legs and underarms) for  at least 48 hours prior to the first CHG shower.  You may shave your face. Please follow these instructions carefully:   1.  Shower with CHG Soap the night before surgery and the  morning of Surgery.   2.  If you choose to wash your hair, wash your hair first as usual with your  normal  Shampoo.   3.  After you shampoo, rinse your hair and body thoroughly to remove the  shampoo.                                         4.  Use CHG as you would any other liquid soap.  You can apply chg directly  to the skin and wash . Gently wash with scrungie or clean wascloth    5.  Apply the CHG Soap to your body ONLY FROM THE NECK DOWN.   Do not use on open                           Wound or open sores. Avoid contact with eyes, ears mouth and genitals (private parts).                        Genitals (private parts) with your normal soap.              6.  Wash thoroughly, paying special attention to the area where your surgery  will be performed.   7.  Thoroughly rinse your body with warm water from the neck down.   8.  DO NOT shower/wash with your normal soap after using and rinsing off  the CHG Soap .                9.  Pat yourself dry with a clean towel.             10.  Wear clean pajamas.             11.  Place clean sheets on your bed the night of your first shower and do not  sleep with pets.  Day of Surgery : Do not apply any lotions/deodorants the morning of surgery.  Please wear clean clothes to the hospital/surgery center.  FAILURE TO FOLLOW THESE INSTRUCTIONS MAY RESULT IN THE CANCELLATION OF YOUR SURGERY    PATIENT SIGNATURE_________________________________  ______________________________________________________________________    WHAT IS A BLOOD TRANSFUSION? Blood Transfusion Information  A transfusion is the replacement of blood or some of its parts. Blood is made up of multiple cells which provide different functions.  Red blood cells  carry oxygen and are used for blood loss replacement.  White blood cells fight against infection.  Platelets control bleeding.  Plasma helps clot blood.  Other blood products are available for specialized needs, such as hemophilia or other clotting disorders. BEFORE THE TRANSFUSION  Who gives blood for transfusions?   Healthy volunteers who are fully evaluated to make sure their blood is safe. This is blood bank blood. Transfusion therapy is the safest it has ever been in the practice of medicine. Before blood is taken from a donor, a complete history is taken to make sure that person has no history of diseases nor engages in risky social behavior (examples are intravenous drug use or sexual activity with multiple partners). The donor's travel history is screened to minimize  risk of transmitting infections, such as malaria. The donated blood is tested for signs of infectious diseases, such as HIV and hepatitis. The blood is then tested to be sure it is compatible with you in order to minimize the chance of a transfusion reaction. If you or a relative donates blood, this is often done in anticipation of surgery and is not appropriate for emergency situations. It takes many days to process the donated blood. RISKS AND COMPLICATIONS Although transfusion therapy is very safe and saves many lives, the main dangers of transfusion include:   Getting an infectious disease.  Developing a transfusion reaction. This is an allergic reaction to something in the blood you were given. Every precaution is taken to prevent this. The decision to have a blood transfusion has been considered carefully by your caregiver before blood is given. Blood is not given unless the benefits outweigh the risks. AFTER THE TRANSFUSION  Right after receiving a blood transfusion, you will usually feel much better and more energetic. This is especially true if your red blood cells have gotten low (anemic). The transfusion raises  the level of the red blood cells which carry oxygen, and this usually causes an energy increase.  The nurse administering the transfusion will monitor you carefully for complications. HOME CARE INSTRUCTIONS  No special instructions are needed after a transfusion. You may find your energy is better. Speak with your caregiver about any limitations on activity for underlying diseases you may have. SEEK MEDICAL CARE IF:   Your condition is not improving after your transfusion.  You develop redness or irritation at the intravenous (IV) site. SEEK IMMEDIATE MEDICAL CARE IF:  Any of the following symptoms occur over the next 12 hours:  Shaking chills.  You have a temperature by mouth above 102 F (38.9 C), not controlled by medicine.  Chest, back, or muscle pain.  People around you feel you are not acting correctly or are confused.  Shortness of breath or difficulty breathing.  Dizziness and fainting.  You get a rash or develop hives.  You have a decrease in urine output.  Your urine turns a dark color or changes to pink, red, or brown. Any of the following symptoms occur over the next 10 days:  You have a temperature by mouth above 102 F (38.9 C), not controlled by medicine.  Shortness of breath.  Weakness after normal activity.  The white part of the eye turns yellow (jaundice).  You have a decrease in the amount of urine or are urinating less often.  Your urine turns a dark color or changes to pink, red, or brown. Document Released: 11/28/2000 Document Revised: 02/23/2012 Document Reviewed: 07/17/2008 [facility] Patient Information 2014 [place], [place].  _______________________________________________________________________

## 2014-06-23 ENCOUNTER — Encounter (HOSPITAL_COMMUNITY)
Admission: RE | Admit: 2014-06-23 | Discharge: 2014-06-23 | Disposition: A | Discharge status: Home / Self Care | Payer: Medicare Other | Source: Ambulatory Visit | Attending: General Surgery | Admitting: General Surgery

## 2014-06-23 ENCOUNTER — Encounter (HOSPITAL_COMMUNITY): Payer: Self-pay | Admitting: Pharmacy Technician

## 2014-06-23 ENCOUNTER — Encounter (HOSPITAL_COMMUNITY): Payer: Self-pay

## 2014-06-23 DIAGNOSIS — Z01810 Encounter for preprocedural cardiovascular examination: Secondary | ICD-10-CM | POA: Diagnosis not present

## 2014-06-23 DIAGNOSIS — Z01812 Encounter for preprocedural laboratory examination: Secondary | ICD-10-CM | POA: Diagnosis not present

## 2014-06-23 HISTORY — DX: Bipolar disorder, unspecified: F31.9

## 2014-06-23 HISTORY — DX: Adverse effect of unspecified anesthetic, initial encounter: T41.45XA

## 2014-06-23 HISTORY — DX: Other complications of anesthesia, initial encounter: T88.59XA

## 2014-06-23 LAB — ABO/RH: ABO/RH(D): A NEG

## 2014-06-23 LAB — BASIC METABOLIC PANEL
Anion gap: 13 (ref 5–15)
BUN: 18 mg/dL (ref 6–23)
CALCIUM: 9.5 mg/dL (ref 8.4–10.5)
CO2: 24 mEq/L (ref 19–32)
CREATININE: 1.07 mg/dL (ref 0.50–1.35)
Chloride: 99 mEq/L (ref 96–112)
GFR, EST AFRICAN AMERICAN: 82 mL/min — AB (ref >90)
GFR, EST NON AFRICAN AMERICAN: 70 mL/min — AB (ref >90)
GLUCOSE: 152 mg/dL — AB (ref 70–99)
POTASSIUM: 4.6 meq/L (ref 3.7–5.3)
Sodium: 136 mEq/L — ABNORMAL LOW (ref 137–147)

## 2014-06-23 LAB — HEMOGLOBIN A1C
HEMOGLOBIN A1C: 5.9 % — AB (ref <5.7)
Mean Plasma Glucose: 123 mg/dL — ABNORMAL HIGH (ref <117)

## 2014-06-23 LAB — CBC
HCT: 36.3 % — ABNORMAL LOW (ref 39.0–52.0)
Hemoglobin: 12.1 g/dL — ABNORMAL LOW (ref 13.0–17.0)
MCH: 31.8 pg (ref 26.0–34.0)
MCHC: 33.3 g/dL (ref 30.0–36.0)
MCV: 95.5 fL (ref 78.0–100.0)
Platelets: 229 10*3/uL (ref 150–400)
RBC: 3.8 MIL/uL — ABNORMAL LOW (ref 4.22–5.81)
RDW: 13.2 % (ref 11.5–15.5)
WBC: 8.2 10*3/uL (ref 4.0–10.5)

## 2014-06-29 ENCOUNTER — Emergency Department (HOSPITAL_COMMUNITY): Payer: Medicare Other

## 2014-06-29 ENCOUNTER — Inpatient Hospital Stay (HOSPITAL_COMMUNITY)
Admission: EM | Admit: 2014-06-29 | Discharge: 2014-07-05 | DRG: 330 | Disposition: A | Discharge status: Home Health | Payer: Medicare Other | Attending: General Surgery | Admitting: General Surgery

## 2014-06-29 ENCOUNTER — Encounter (HOSPITAL_COMMUNITY): Payer: Self-pay | Admitting: Emergency Medicine

## 2014-06-29 DIAGNOSIS — M1990 Unspecified osteoarthritis, unspecified site: Secondary | ICD-10-CM | POA: Diagnosis not present

## 2014-06-29 DIAGNOSIS — R112 Nausea with vomiting, unspecified: Secondary | ICD-10-CM | POA: Diagnosis not present

## 2014-06-29 DIAGNOSIS — R634 Abnormal weight loss: Secondary | ICD-10-CM | POA: Diagnosis present

## 2014-06-29 DIAGNOSIS — I10 Essential (primary) hypertension: Secondary | ICD-10-CM | POA: Diagnosis present

## 2014-06-29 DIAGNOSIS — E785 Hyperlipidemia, unspecified: Secondary | ICD-10-CM | POA: Diagnosis present

## 2014-06-29 DIAGNOSIS — R079 Chest pain, unspecified: Secondary | ICD-10-CM | POA: Diagnosis not present

## 2014-06-29 DIAGNOSIS — M810 Age-related osteoporosis without current pathological fracture: Secondary | ICD-10-CM | POA: Diagnosis present

## 2014-06-29 DIAGNOSIS — K219 Gastro-esophageal reflux disease without esophagitis: Secondary | ICD-10-CM | POA: Diagnosis present

## 2014-06-29 DIAGNOSIS — Z01812 Encounter for preprocedural laboratory examination: Secondary | ICD-10-CM | POA: Diagnosis not present

## 2014-06-29 DIAGNOSIS — F319 Bipolar disorder, unspecified: Secondary | ICD-10-CM | POA: Diagnosis present

## 2014-06-29 DIAGNOSIS — Z8249 Family history of ischemic heart disease and other diseases of the circulatory system: Secondary | ICD-10-CM

## 2014-06-29 DIAGNOSIS — J449 Chronic obstructive pulmonary disease, unspecified: Secondary | ICD-10-CM | POA: Diagnosis not present

## 2014-06-29 DIAGNOSIS — C180 Malignant neoplasm of cecum: Principal | ICD-10-CM | POA: Diagnosis present

## 2014-06-29 DIAGNOSIS — K56600 Partial intestinal obstruction, unspecified as to cause: Secondary | ICD-10-CM

## 2014-06-29 DIAGNOSIS — R1013 Epigastric pain: Secondary | ICD-10-CM | POA: Diagnosis not present

## 2014-06-29 DIAGNOSIS — Z87891 Personal history of nicotine dependence: Secondary | ICD-10-CM | POA: Diagnosis not present

## 2014-06-29 DIAGNOSIS — Z8711 Personal history of peptic ulcer disease: Secondary | ICD-10-CM | POA: Diagnosis not present

## 2014-06-29 DIAGNOSIS — I959 Hypotension, unspecified: Secondary | ICD-10-CM | POA: Diagnosis not present

## 2014-06-29 DIAGNOSIS — C189 Malignant neoplasm of colon, unspecified: Secondary | ICD-10-CM | POA: Diagnosis not present

## 2014-06-29 DIAGNOSIS — Z79899 Other long term (current) drug therapy: Secondary | ICD-10-CM

## 2014-06-29 DIAGNOSIS — F411 Generalized anxiety disorder: Secondary | ICD-10-CM | POA: Diagnosis present

## 2014-06-29 DIAGNOSIS — G8929 Other chronic pain: Secondary | ICD-10-CM | POA: Diagnosis present

## 2014-06-29 DIAGNOSIS — K56609 Unspecified intestinal obstruction, unspecified as to partial versus complete obstruction: Secondary | ICD-10-CM | POA: Diagnosis present

## 2014-06-29 DIAGNOSIS — Z4682 Encounter for fitting and adjustment of non-vascular catheter: Secondary | ICD-10-CM | POA: Diagnosis not present

## 2014-06-29 DIAGNOSIS — J4489 Other specified chronic obstructive pulmonary disease: Secondary | ICD-10-CM | POA: Diagnosis present

## 2014-06-29 DIAGNOSIS — Z6825 Body mass index (BMI) 25.0-25.9, adult: Secondary | ICD-10-CM | POA: Diagnosis not present

## 2014-06-29 LAB — CBC WITH DIFFERENTIAL/PLATELET
Basophils Absolute: 0 10*3/uL (ref 0.0–0.1)
Basophils Relative: 0 % (ref 0–1)
EOS ABS: 0 10*3/uL (ref 0.0–0.7)
EOS PCT: 0 % (ref 0–5)
HCT: 34.9 % — ABNORMAL LOW (ref 39.0–52.0)
Hemoglobin: 11.8 g/dL — ABNORMAL LOW (ref 13.0–17.0)
LYMPHS PCT: 8 % — AB (ref 12–46)
Lymphs Abs: 0.9 10*3/uL (ref 0.7–4.0)
MCH: 31.9 pg (ref 26.0–34.0)
MCHC: 33.8 g/dL (ref 30.0–36.0)
MCV: 94.3 fL (ref 78.0–100.0)
MONOS PCT: 7 % (ref 3–12)
Monocytes Absolute: 0.8 10*3/uL (ref 0.1–1.0)
Neutro Abs: 9.1 10*3/uL — ABNORMAL HIGH (ref 1.7–7.7)
Neutrophils Relative %: 85 % — ABNORMAL HIGH (ref 43–77)
PLATELETS: 248 10*3/uL (ref 150–400)
RBC: 3.7 MIL/uL — AB (ref 4.22–5.81)
RDW: 13.1 % (ref 11.5–15.5)
WBC: 10.8 10*3/uL — AB (ref 4.0–10.5)

## 2014-06-29 LAB — COMPREHENSIVE METABOLIC PANEL
ALK PHOS: 114 U/L (ref 39–117)
ALT: 18 U/L (ref 0–53)
ANION GAP: 14 (ref 5–15)
AST: 23 U/L (ref 0–37)
Albumin: 3.7 g/dL (ref 3.5–5.2)
BUN: 14 mg/dL (ref 6–23)
CO2: 23 meq/L (ref 19–32)
Calcium: 9.4 mg/dL (ref 8.4–10.5)
Chloride: 100 mEq/L (ref 96–112)
Creatinine, Ser: 1.16 mg/dL (ref 0.50–1.35)
GFR calc non Af Amer: 64 mL/min — ABNORMAL LOW (ref >90)
GFR, EST AFRICAN AMERICAN: 74 mL/min — AB (ref >90)
GLUCOSE: 138 mg/dL — AB (ref 70–99)
POTASSIUM: 4.9 meq/L (ref 3.7–5.3)
SODIUM: 137 meq/L (ref 137–147)
Total Bilirubin: 0.4 mg/dL (ref 0.3–1.2)
Total Protein: 7.3 g/dL (ref 6.0–8.3)

## 2014-06-29 LAB — LIPASE, BLOOD: Lipase: 10 U/L — ABNORMAL LOW (ref 11–59)

## 2014-06-29 MED ORDER — CLONAZEPAM 0.5 MG PO TABS
0.2500 mg | ORAL_TABLET | Freq: Two times a day (BID) | ORAL | Status: DC
  Administered 2014-06-30: 0.25 mg via ORAL
  Filled 2014-06-29: qty 1

## 2014-06-29 MED ORDER — DEXTROSE 5 % IV SOLN
2.0000 g | INTRAVENOUS | Status: AC
  Administered 2014-06-30: 2 g via INTRAVENOUS
  Filled 2014-06-29: qty 2

## 2014-06-29 MED ORDER — DEXTROSE 5 % IV SOLN: 2.0000 g | INTRAVENOUS | Status: DC

## 2014-06-29 MED ORDER — SODIUM CHLORIDE 0.9 % IV BOLUS (SEPSIS)
1000.0000 mL | Freq: Once | INTRAVENOUS | Status: AC
  Administered 2014-06-29: 1000 mL via INTRAVENOUS

## 2014-06-29 MED ORDER — KCL IN DEXTROSE-NACL 20-5-0.45 MEQ/L-%-% IV SOLN
INTRAVENOUS | Status: DC
  Administered 2014-06-29: via INTRAVENOUS
  Filled 2014-06-29 (×3): qty 1000

## 2014-06-29 MED ORDER — MORPHINE SULFATE 2 MG/ML IJ SOLN
1.0000 mg | INTRAMUSCULAR | Status: DC | PRN
  Administered 2014-06-29 – 2014-06-30 (×2): 2 mg via INTRAVENOUS
  Filled 2014-06-29 (×2): qty 1

## 2014-06-29 MED ORDER — SODIUM CHLORIDE 0.9 % IV SOLN
INTRAVENOUS | Status: DC
Start: 2014-06-29 — End: 2014-06-29

## 2014-06-29 MED ORDER — ONDANSETRON HCL 4 MG/2ML IJ SOLN
4.0000 mg | Freq: Four times a day (QID) | INTRAMUSCULAR | Status: DC | PRN
  Administered 2014-06-29: 4 mg via INTRAVENOUS
  Filled 2014-06-29: qty 2

## 2014-06-29 MED ORDER — ALVIMOPAN 12 MG PO CAPS: 12.0000 mg | ORAL_CAPSULE | Freq: Once | ORAL | Status: DC

## 2014-06-29 MED ORDER — ALVIMOPAN 12 MG PO CAPS
12.0000 mg | ORAL_CAPSULE | Freq: Once | ORAL | Status: AC
  Administered 2014-06-30: 12 mg via ORAL
  Filled 2014-06-29 (×2): qty 1

## 2014-06-29 NOTE — ED Notes (Signed)
Report called to floor Arrie Aran, RN) All questions answered by this nurse Patient in NAD upon leaving ED for floor bed

## 2014-06-29 NOTE — ED Notes (Signed)
Surgical consult at bedside

## 2014-06-29 NOTE — ED Notes (Signed)
Pt presents via EMS. Pt is having surgery tomorrow for his colon cancer. Pt has been drinking the colon cleanse today prior to surgery and started to have nausea and vomiting. Pt reported that with the vomiting he started to have some chest wall pain. 12 lead unremarkable per EMS, sinus tach only. Pt has a 18 g left hand placed by EMS, 4 zofran given en route.

## 2014-06-29 NOTE — ED Provider Notes (Signed)
CSN: [id number]     Arrival date & time 06/29/14  1741 History   First MD Initiated Contact with Patient 06/29/14 1744     No chief complaint on file.    (Consider location/radiation/quality/duration/timing/severity/associated sxs/prior Treatment) The history is provided by the patient and the spouse.   patient here complaining of nonbilious emesis along with watery diarrhea which began today. Patient scheduled for colon surgery tomorrow secondary to recent diagnosis of colon cancer. He has been using a bowel prep. No fever or chills. No blood in stool. Symptoms persistent. Nothing makes them better worse. Called EMS and was given IV fluids and Zofran and the nausea has improved but the diarrhea persists. Denies any recent antibiotic use.  Past Medical History  Diagnosis Date  . Arthritis   . Anxiety   . Panic attack   . Chronic pain     CHRONIC BACK PAIN  . Cancer   . GERD (gastroesophageal reflux disease)   . Hyperlipidemia   . Osteoporosis   . Rectus diastasis   . History of stomach ulcers     WITH GI BLEED  . Complication of anesthesia     "I GET REAL COLD AND CAN'T URINATE"  . COPD (chronic obstructive pulmonary disease)   . Cecal cancer   . History of transfusion   . Bipolar 1 disorder    Past Surgical History  Procedure Laterality Date  . Back surgery  1983  . Neck surgery  2004   Family History  Problem Relation Age of Onset  . Heart disease Mother    History  Substance Use Topics  . Smoking status: Former Smoker    Quit date: 06/23/2004  . Smokeless tobacco: Not on file  . Alcohol Use: No    Review of Systems  All other systems reviewed and are negative.     Allergies  Ibuprofen and Bee venom  Home Medications   Prior to Admission medications   Medication Sig Start Date End Date Taking? Authorizing Provider  albuterol (PROVENTIL HFA;VENTOLIN HFA) 108 (90 BASE) MCG/ACT inhaler Inhale into the lungs every 6 (six) hours as needed for wheezing or  shortness of breath.    Historical Provider, MD  citalopram (CELEXA) 40 MG tablet Take 40 mg by mouth at bedtime.    Historical Provider, MD  clonazePAM (KLONOPIN) 0.5 MG tablet Take 0.25 mg by mouth 2 (two) times daily.    Historical Provider, MD  cyclobenzaprine (FLEXERIL) 10 MG tablet Take 10 mg by mouth 3 (three) times daily.  04/04/14   Historical Provider, MD  diphenhydrAMINE (BENADRYL) 25 mg capsule Take 1 capsule (25 mg total) by mouth 3 (three) times daily. 07/27/12 07/28/12  Carmin Muskrat, MD  docusate sodium (COLACE) 100 MG capsule Take 200 mg by mouth at bedtime.    Historical Provider, MD  EPINEPHrine 0.3 mg/0.3 mL IJ SOAJ injection Inject 0.3 mg into the muscle daily as needed (allergic reaction).    Historical Provider, MD  famotidine (PEPCID) 20 MG tablet Take 1 tablet (20 mg total) by mouth 2 (two) times daily. 07/27/12 07/29/13  Carmin Muskrat, MD  folic acid (FOLVITE) 1 MG tablet Take 1 mg by mouth 2 (two) times daily.    Historical Provider, MD  Multiple Vitamin (MULTIVITAMIN WITH MINERALS) TABS tablet Take 1 tablet by mouth daily.    Historical Provider, MD  Omega-3 Fatty Acids (FISH OIL PO) Take 1 capsule by mouth daily.    Historical Provider, MD  omeprazole (PRILOSEC) 20 MG capsule Take  20 mg by mouth daily.  04/04/14   Historical Provider, MD  oxyCODONE-acetaminophen (PERCOCET/ROXICET) 5-325 MG per tablet Take 1 tablet by mouth every 4 (four) hours as needed for severe pain.  05/10/14   Historical Provider, MD  polyethylene glycol (MIRALAX / GLYCOLAX) packet Take 17 g by mouth daily.    Historical Provider, MD  pravastatin (PRAVACHOL) 40 MG tablet Take 20 mg by mouth at bedtime.  03/16/14   Historical Provider, MD  QUEtiapine (SEROQUEL) 400 MG tablet Take 400 mg by mouth at bedtime.    Historical Provider, MD  VITAMIN D, CHOLECALCIFEROL, PO Take 1 capsule by mouth 2 (two) times daily.    Historical Provider, MD   BP 120/69  Pulse 88  Temp(Src) 98.6 F (37 C) (Oral)  Resp 19   SpO2 99% Physical Exam  Nursing note and vitals reviewed. Constitutional: He is oriented to person, place, and time. He appears well-developed and well-nourished.  Non-toxic appearance. No distress.  HENT:  Head: Normocephalic and atraumatic.  Eyes: Conjunctivae, EOM and lids are normal. Pupils are equal, round, and reactive to light.  Neck: Normal range of motion. Neck supple. No tracheal deviation present. No mass present.  Cardiovascular: Normal rate, regular rhythm and normal heart sounds.  Exam reveals no gallop.   No murmur heard. Pulmonary/Chest: Effort normal and breath sounds normal. No stridor. No respiratory distress. He has no decreased breath sounds. He has no wheezes. He has no rhonchi. He has no rales.  Abdominal: Soft. Normal appearance and bowel sounds are normal. He exhibits no distension. There is no tenderness. There is no rebound and no CVA tenderness.  Musculoskeletal: Normal range of motion. He exhibits no edema and no tenderness.  Neurological: He is alert and oriented to person, place, and time. He has normal strength. No cranial nerve deficit or sensory deficit. GCS eye subscore is 4. GCS verbal subscore is 5. GCS motor subscore is 6.  Skin: Skin is warm and dry. No abrasion and no rash noted.  Psychiatric: He has a normal mood and affect. His speech is normal and behavior is normal.    ED Course  Procedures (including critical care time) Labs Review Labs Reviewed  CBC WITH DIFFERENTIAL  COMPREHENSIVE METABOLIC PANEL  LIPASE, BLOOD    Imaging Review No results found.   EKG Interpretation None      MDM   Final diagnoses:  None    Patient given IV fluids and NG tube placed her nursing. Discuss with Dr. Lucia Gaskins and he will come to admit the patient    Leota Jacobsen, MD 06/29/14 2019

## 2014-06-29 NOTE — ED Notes (Signed)
Bed: IA16 Expected date:  Expected time:  Means of arrival:  Comments: EMS- n/v & chest wall pain, pre-op cleanse

## 2014-06-29 NOTE — H&P (Addendum)
[place], MD,  [name]  [place], [place]    Matthew [name] Phone:  [phone] FAX:  [phone]   Re:   Matthew [name] DOB:   01/21/1946 MRN:   [id number]  ASSESSMENT AND PLAN: 1.  Ileus vs SBO  Planadmit, IVT, and NGT tonight.  I doubt this will delay surgery. 2.  Bulky cecal/right colon ca - on for surgery in AM by Dr. Marcello Moores  He is on for surgery at 7:30 AM tomorrow 3.  Anxiety  4.  Chronic pain - seen through the [facility]  He takes 3 to 5 5mg  oxycodone daily 5.  COPD 6.  Bipolar disease - seen through the Matthew [name] is a 68 y.o. (DOB: 1946/08/12)  white  male who is a patient of CASSADA, PEGGY, MD and comes to the [facility] with nausea and vomiting while taking his colon prep.  He also had chest and abdominal pain and thought he was having a heart attack.  He came to the [facility] by ambulance. His wife, Matthew [name], is at the bedside. He is still having some BM's - loose and fairly clear.  He presented to Dr. Marcello Moores with a newly diagnosed colon cancer on 06/06/2014. This was found on screening colonscopy. He was undergoing a work up for abd pain and nausea. He denies rectal bleeding. He has had a 10lb weight loss over the past few weeks. He reports chronic mid-epigastric pain with associated nausea. He denies chest pain or SOB with activity.   Past Medical History  Diagnosis Date  . Arthritis   . Anxiety   . Panic attack   . Chronic pain     CHRONIC BACK PAIN  . Cancer   . GERD (gastroesophageal reflux disease)   . Hyperlipidemia   . Osteoporosis   . Rectus diastasis   . History of stomach ulcers     WITH GI BLEED  . Complication of anesthesia     "I GET REAL COLD AND CAN'T URINATE"  . COPD (chronic obstructive pulmonary disease)   . Cecal cancer   . History of transfusion   . Bipolar 1 disorder     Past Surgical History   Procedure  Laterality  Date   .  Back surgery   1983   .  Neck surgery    2004    Current Outpatient Prescriptions   Medication  Sig  Dispense  Refill   .  cyclobenzaprine (FLEXERIL) 10 MG tablet      .  EPINEPHrine (EPI-PEN) 0.3 mg/0.3 mL DEVI  Inject 0.3 mLs (0.3 mg total) into the muscle once.  2 Device  2   .  GAVILYTE-N WITH FLAVOR PACK 420 G solution      .  lisinopril (PRINIVIL,ZESTRIL) 5 MG tablet      .  omeprazole (PRILOSEC) 20 MG capsule      .  oxyCODONE-acetaminophen (PERCOCET/ROXICET) 5-325 MG per tablet      .  pravastatin (PRAVACHOL) 40 MG tablet      .  diphenhydrAMINE (BENADRYL) 25 mg capsule  Take 1 capsule (25 mg total) by mouth 3 (three) times daily.  6 capsule  0   .  famotidine (PEPCID) 20 MG tablet  Take 1 tablet (20 mg total) by mouth 2 (two) times daily.  4 tablet  0   .  metroNIDAZOLE (FLAGYL) 500 MG tablet  Take 1 tablet (500 mg total) by mouth as  directed.  3 tablet  0    No current facility-administered medications for this visit.    Allergies   Allergen  Reactions   .  Ibuprofen  Shortness Of Breath and Swelling   .  Bee Venom     Family History   Problem  Relation  Age of Onset   .  Heart disease  Mother     History    Social History   .  Marital Status:  Married     Spouse Name:  N/A     Number of Children:  N/A   .  Years of Education:  N/A    Social History Main Topics   .  Smoking status:  Never Smoker   .  Smokeless tobacco:  None   .  Alcohol Use:  No   .  Drug Use:  No   .  Sexual Activity:  No    Other Topics  Concern   .  None    Social History Narrative   .  None    PHYSICAL EXAM: BP 120/69  Pulse 88  Temp(Src) 98.6 F (37 C) (Oral)  Resp 19  SpO2 99%  Gen: No acute distress.  Neurological: Alert and oriented to person, place, and time.  Head: Normocephalic and atraumatic.  Eyes: Conjunctivae are normal. Pupils are equal. Neck: Normal range of motion. Neck supple. No cervical lymphadenopathy.  Cardiovascular: Normal rate, regular rhythm, normal heart sounds and intact distal pulses.   Respiratory: Breath sounds normal. No wheezes, rales or rhonchi.  GI: Soft. Bowel sounds are decreased.  He has mild distention.  No abdominal scars. Musculoskeletal: Normal range of motion.  Skin: Skin is warm and dry.  Psychiatric: Normal mood and affect. Behavior is normal.   LABORATORY RESULTS:  Available labs are reviewed   CEA: 192.2 06/01/14   RADIOLOGY RESULTS:  Images and reports are reviewed.   CT CHEST ABD AND PELVIS IMPRESSION:  Irregular wall thickening in the medial cecum with soft tissue stranding in the pericecal fat and small adjacent pericecal lymph nodes. CT imaging features are concerning for transmural tumor extension with local metastatic involvement of lymph nodes.  17 mm low-density lesion in the right kidney has attenuation too high to allow classification as a simple cyst. This may be a cyst complicated by proteinaceous debris or hemorrhage. MRI without and with contrast could be used to more definitively characterize as clinically warranted. 4.8 cm exophytic lesion in the upper pole the left kidney has imaging features compatible with a cyst.   KUB - 06/29/2104 - Small bowel air-fluid levels with mild small bowel dilatation.  Suspicious for partial small bowel obstruction. Adynamic ileus could look similar. No free intraperitoneal air or other acute complication identified.

## 2014-06-29 NOTE — ED Notes (Signed)
MD at bedside. 

## 2014-06-30 ENCOUNTER — Inpatient Hospital Stay (HOSPITAL_COMMUNITY): Payer: Medicare Other | Admitting: Anesthesiology

## 2014-06-30 ENCOUNTER — Encounter (HOSPITAL_COMMUNITY)
Admission: EM | Disposition: A | Discharge status: Home Health | Payer: Self-pay | Source: Home / Self Care | Attending: General Surgery

## 2014-06-30 ENCOUNTER — Encounter (HOSPITAL_COMMUNITY): Payer: Medicare Other | Admitting: Anesthesiology

## 2014-06-30 ENCOUNTER — Inpatient Hospital Stay (HOSPITAL_COMMUNITY): Admission: RE | Admit: 2014-06-30 | Payer: Medicare Other | Source: Ambulatory Visit | Admitting: General Surgery

## 2014-06-30 DIAGNOSIS — C189 Malignant neoplasm of colon, unspecified: Secondary | ICD-10-CM

## 2014-06-30 HISTORY — PX: LAPAROSCOPIC PARTIAL COLECTOMY: SHX5907

## 2014-06-30 LAB — HEMOGLOBIN A1C
Hgb A1c MFr Bld: 6 % — ABNORMAL HIGH (ref <5.7)
MEAN PLASMA GLUCOSE: 126 mg/dL — AB (ref <117)

## 2014-06-30 LAB — TYPE AND SCREEN
ABO/RH(D): A NEG
ANTIBODY SCREEN: NEGATIVE

## 2014-06-30 LAB — SURGICAL PCR SCREEN
MRSA, PCR: NEGATIVE
Staphylococcus aureus: NEGATIVE

## 2014-06-30 SURGERY — LAPAROSCOPIC PARTIAL COLECTOMY
Anesthesia: General

## 2014-06-30 MED ORDER — ALBUTEROL SULFATE (2.5 MG/3ML) 0.083% IN NEBU: 3.0000 mL | INHALATION_SOLUTION | Freq: Four times a day (QID) | RESPIRATORY_TRACT | Status: DC | PRN

## 2014-06-30 MED ORDER — METHYLENE BLUE 1 % INJ SOLN
INTRAMUSCULAR | Status: AC
  Filled 2014-06-30: qty 10

## 2014-06-30 MED ORDER — DEXAMETHASONE SODIUM PHOSPHATE 10 MG/ML IJ SOLN
INTRAMUSCULAR | Status: DC | PRN
  Administered 2014-06-30: 10 mg via INTRAVENOUS

## 2014-06-30 MED ORDER — PROMETHAZINE HCL 25 MG/ML IJ SOLN
6.2500 mg | INTRAMUSCULAR | Status: AC | PRN
  Administered 2014-06-30 – 2014-07-03 (×2): 12.5 mg via INTRAVENOUS
  Filled 2014-06-30 (×2): qty 1

## 2014-06-30 MED ORDER — CLONAZEPAM 0.5 MG PO TABS
0.2500 mg | ORAL_TABLET | Freq: Two times a day (BID) | ORAL | Status: DC
  Administered 2014-06-30 – 2014-07-05 (×11): 0.25 mg via ORAL
  Filled 2014-06-30 (×11): qty 1

## 2014-06-30 MED ORDER — LACTATED RINGERS IR SOLN
Status: DC | PRN
  Administered 2014-06-30: 1

## 2014-06-30 MED ORDER — BUPIVACAINE-EPINEPHRINE 0.25% -1:200000 IJ SOLN
INTRAMUSCULAR | Status: DC | PRN
  Administered 2014-06-30: 26 mL

## 2014-06-30 MED ORDER — KCL IN DEXTROSE-NACL 20-5-0.9 MEQ/L-%-% IV SOLN
INTRAVENOUS | Status: DC
  Administered 2014-06-30 – 2014-07-01 (×3): via INTRAVENOUS
  Filled 2014-06-30 (×8): qty 1000

## 2014-06-30 MED ORDER — LIDOCAINE HCL (CARDIAC) 20 MG/ML IV SOLN
INTRAVENOUS | Status: DC | PRN
  Administered 2014-06-30: 50 mg via INTRAVENOUS

## 2014-06-30 MED ORDER — LACTATED RINGERS IV SOLN
INTRAVENOUS | Status: DC
  Administered 2014-06-30: 1000 mL via INTRAVENOUS

## 2014-06-30 MED ORDER — ENOXAPARIN SODIUM 40 MG/0.4ML ~~LOC~~ SOLN
40.0000 mg | SUBCUTANEOUS | Status: DC
  Administered 2014-07-01: 40 mg via SUBCUTANEOUS
  Filled 2014-06-30 (×2): qty 0.4

## 2014-06-30 MED ORDER — MEPERIDINE HCL 50 MG/ML IJ SOLN: 6.2500 mg | INTRAMUSCULAR | Status: DC | PRN

## 2014-06-30 MED ORDER — ROCURONIUM BROMIDE 100 MG/10ML IV SOLN
INTRAVENOUS | Status: DC | PRN
  Administered 2014-06-30: 10 mg via INTRAVENOUS
  Administered 2014-06-30: 30 mg via INTRAVENOUS
  Administered 2014-06-30 (×2): 10 mg via INTRAVENOUS

## 2014-06-30 MED ORDER — FENTANYL CITRATE 0.05 MG/ML IJ SOLN
INTRAMUSCULAR | Status: DC | PRN
  Administered 2014-06-30: 100 ug via INTRAVENOUS
  Administered 2014-06-30 (×5): 50 ug via INTRAVENOUS

## 2014-06-30 MED ORDER — ALVIMOPAN 12 MG PO CAPS
12.0000 mg | ORAL_CAPSULE | Freq: Two times a day (BID) | ORAL | Status: DC
  Administered 2014-07-01 – 2014-07-03 (×6): 12 mg via ORAL
  Filled 2014-06-30 (×8): qty 1

## 2014-06-30 MED ORDER — HYDROMORPHONE HCL PF 1 MG/ML IJ SOLN
INTRAMUSCULAR | Status: AC
  Filled 2014-06-30: qty 1

## 2014-06-30 MED ORDER — ACETAMINOPHEN 500 MG PO TABS
1000.0000 mg | ORAL_TABLET | Freq: Four times a day (QID) | ORAL | Status: AC
  Administered 2014-06-30 – 2014-07-01 (×4): 1000 mg via ORAL
  Filled 2014-06-30 (×4): qty 2

## 2014-06-30 MED ORDER — HYDROMORPHONE HCL PF 1 MG/ML IJ SOLN
0.2500 mg | INTRAMUSCULAR | Status: DC | PRN
  Administered 2014-06-30 (×4): 0.5 mg via INTRAVENOUS

## 2014-06-30 MED ORDER — NALOXONE HCL 0.4 MG/ML IJ SOLN: 0.4000 mg | INTRAMUSCULAR | Status: DC | PRN

## 2014-06-30 MED ORDER — METHYLENE BLUE 1 % INJ SOLN
INTRAMUSCULAR | Status: DC | PRN
  Administered 2014-06-30: 50 mg via INTRAVENOUS

## 2014-06-30 MED ORDER — MORPHINE SULFATE (PF) 1 MG/ML IV SOLN
INTRAVENOUS | Status: DC
  Administered 2014-06-30: 6 mg via INTRAVENOUS
  Administered 2014-06-30: 1.5 mg via INTRAVENOUS
  Administered 2014-06-30: 15:00:00 via INTRAVENOUS
  Administered 2014-06-30: 1 mg via INTRAVENOUS
  Administered 2014-06-30: 19:00:00 via INTRAVENOUS
  Administered 2014-07-01: 25 mg via INTRAVENOUS
  Administered 2014-07-01: 14:00:00 via INTRAVENOUS
  Administered 2014-07-01: 15 mg via INTRAVENOUS
  Administered 2014-07-01 (×3): via INTRAVENOUS
  Administered 2014-07-01: 30 mg via INTRAVENOUS
  Administered 2014-07-01: 26.7 mg via INTRAVENOUS
  Administered 2014-07-02: 3 mg via INTRAVENOUS
  Administered 2014-07-02: 19:00:00 via INTRAVENOUS
  Administered 2014-07-02: 27 mg via INTRAVENOUS
  Administered 2014-07-02: 3 mg via INTRAVENOUS
  Administered 2014-07-02 (×2): via INTRAVENOUS
  Filled 2014-06-30 (×13): qty 25

## 2014-06-30 MED ORDER — GLYCOPYRROLATE 0.2 MG/ML IJ SOLN
INTRAMUSCULAR | Status: DC | PRN
  Administered 2014-06-30: 0.6 mg via INTRAVENOUS

## 2014-06-30 MED ORDER — DIPHENHYDRAMINE HCL 50 MG/ML IJ SOLN: 12.5000 mg | Freq: Four times a day (QID) | INTRAMUSCULAR | Status: DC | PRN

## 2014-06-30 MED ORDER — OXYCODONE HCL 5 MG PO TABS
5.0000 mg | ORAL_TABLET | Freq: Once | ORAL | Status: AC | PRN
  Administered 2014-06-30: 5 mg via ORAL
  Filled 2014-06-30: qty 1

## 2014-06-30 MED ORDER — ONDANSETRON HCL 4 MG/2ML IJ SOLN
INTRAMUSCULAR | Status: DC | PRN
  Administered 2014-06-30: 4 mg via INTRAVENOUS

## 2014-06-30 MED ORDER — DEXTROSE 5 % IV SOLN
2.0000 g | Freq: Two times a day (BID) | INTRAVENOUS | Status: AC
  Administered 2014-06-30: 2 g via INTRAVENOUS
  Filled 2014-06-30: qty 2

## 2014-06-30 MED ORDER — 0.9 % SODIUM CHLORIDE (POUR BTL) OPTIME
TOPICAL | Status: DC | PRN
  Administered 2014-06-30: 4000 mL

## 2014-06-30 MED ORDER — LACTATED RINGERS IV SOLN
INTRAVENOUS | Status: DC | PRN
  Administered 2014-06-30 (×3): via INTRAVENOUS

## 2014-06-30 MED ORDER — SODIUM CHLORIDE 0.9 % IJ SOLN: 9.0000 mL | INTRAMUSCULAR | Status: DC | PRN

## 2014-06-30 MED ORDER — CITALOPRAM HYDROBROMIDE 40 MG PO TABS
40.0000 mg | ORAL_TABLET | Freq: Every day | ORAL | Status: DC
  Administered 2014-06-30 – 2014-07-04 (×5): 40 mg via ORAL
  Filled 2014-06-30 (×6): qty 1

## 2014-06-30 MED ORDER — CYCLOBENZAPRINE HCL 10 MG PO TABS
10.0000 mg | ORAL_TABLET | Freq: Three times a day (TID) | ORAL | Status: DC
  Administered 2014-06-30 – 2014-07-05 (×15): 10 mg via ORAL
  Filled 2014-06-30 (×18): qty 1

## 2014-06-30 MED ORDER — NEOSTIGMINE METHYLSULFATE 10 MG/10ML IV SOLN
INTRAVENOUS | Status: DC | PRN
  Administered 2014-06-30: 4 mg via INTRAVENOUS

## 2014-06-30 MED ORDER — SUCCINYLCHOLINE CHLORIDE 20 MG/ML IJ SOLN
INTRAMUSCULAR | Status: DC | PRN
  Administered 2014-06-30: 100 mg via INTRAVENOUS

## 2014-06-30 MED ORDER — ONDANSETRON HCL 4 MG/2ML IJ SOLN: 4.0000 mg | Freq: Four times a day (QID) | INTRAMUSCULAR | Status: DC | PRN

## 2014-06-30 MED ORDER — PROPOFOL 10 MG/ML IV BOLUS
INTRAVENOUS | Status: DC | PRN
  Administered 2014-06-30: 180 mg via INTRAVENOUS

## 2014-06-30 MED ORDER — BUPIVACAINE-EPINEPHRINE (PF) 0.25% -1:200000 IJ SOLN
INTRAMUSCULAR | Status: AC
  Filled 2014-06-30: qty 30

## 2014-06-30 MED ORDER — DIPHENHYDRAMINE HCL 12.5 MG/5ML PO ELIX: 12.5000 mg | ORAL_SOLUTION | Freq: Four times a day (QID) | ORAL | Status: DC | PRN

## 2014-06-30 MED ORDER — PANTOPRAZOLE SODIUM 40 MG PO TBEC
40.0000 mg | DELAYED_RELEASE_TABLET | Freq: Every day | ORAL | Status: DC
  Administered 2014-06-30 – 2014-07-05 (×6): 40 mg via ORAL
  Filled 2014-06-30 (×6): qty 1

## 2014-06-30 MED ORDER — PNEUMOCOCCAL VAC POLYVALENT 25 MCG/0.5ML IJ INJ: 0.5000 mL | INJECTION | INTRAMUSCULAR | Status: DC | PRN

## 2014-06-30 MED ORDER — HYDROMORPHONE HCL PF 1 MG/ML IJ SOLN
INTRAMUSCULAR | Status: DC | PRN
  Administered 2014-06-30: 0.5 mg via INTRAVENOUS
  Administered 2014-06-30: 1 mg via INTRAVENOUS
  Administered 2014-06-30: 0.5 mg via INTRAVENOUS

## 2014-06-30 MED ORDER — SIMVASTATIN 20 MG PO TABS
20.0000 mg | ORAL_TABLET | Freq: Every day | ORAL | Status: DC
Start: 2014-06-30 — End: 2014-07-05
  Administered 2014-06-30 – 2014-07-04 (×5): 20 mg via ORAL
  Filled 2014-06-30 (×6): qty 1

## 2014-06-30 MED ORDER — OXYCODONE HCL 5 MG/5ML PO SOLN: 5.0000 mg | Freq: Once | ORAL | Status: AC | PRN

## 2014-06-30 MED ORDER — LABETALOL HCL 5 MG/ML IV SOLN
5.0000 mg | INTRAVENOUS | Status: DC | PRN
  Administered 2014-06-30: 5 mg via INTRAVENOUS
  Filled 2014-06-30: qty 4

## 2014-06-30 MED ORDER — MIDAZOLAM HCL 5 MG/5ML IJ SOLN
INTRAMUSCULAR | Status: DC | PRN
  Administered 2014-06-30 (×2): 1 mg via INTRAVENOUS

## 2014-06-30 MED ORDER — QUETIAPINE FUMARATE 400 MG PO TABS
400.0000 mg | ORAL_TABLET | Freq: Every day | ORAL | Status: DC
  Administered 2014-06-30 – 2014-07-04 (×5): 400 mg via ORAL
  Filled 2014-06-30 (×6): qty 1

## 2014-06-30 SURGICAL SUPPLY — 77 items
APPLIER CLIP 5 13 M/L LIGAMAX5 (MISCELLANEOUS)
BLADE EXTENDED COATED 6.5IN (ELECTRODE) ×3 IMPLANT
BLADE SURG SZ10 CARB STEEL (BLADE) ×3 IMPLANT
CABLE HIGH FREQUENCY MONO STRZ (ELECTRODE) IMPLANT
CELLS DAT CNTRL 66122 CELL SVR (MISCELLANEOUS) ×1 IMPLANT
CHLORAPREP W/TINT 26ML (MISCELLANEOUS) ×3 IMPLANT
CLIP APPLIE 5 13 M/L LIGAMAX5 (MISCELLANEOUS) IMPLANT
COVER MAYO STAND STRL (DRAPES) ×3 IMPLANT
DECANTER SPIKE VIAL GLASS SM (MISCELLANEOUS) IMPLANT
DRAIN CHANNEL 19F RND (DRAIN) IMPLANT
DRAPE LAPAROSCOPIC ABDOMINAL (DRAPES) ×3 IMPLANT
DRAPE LG THREE QUARTER DISP (DRAPES) ×3 IMPLANT
DRAPE UTILITY XL STRL (DRAPES) ×6 IMPLANT
DRAPE WARM FLUID 44X44 (DRAPE) ×3 IMPLANT
DRSG OPSITE POSTOP 4X10 (GAUZE/BANDAGES/DRESSINGS) IMPLANT
DRSG OPSITE POSTOP 4X6 (GAUZE/BANDAGES/DRESSINGS) ×3 IMPLANT
DRSG OPSITE POSTOP 4X8 (GAUZE/BANDAGES/DRESSINGS) IMPLANT
ELECT PENCIL ROCKER SW 15FT (MISCELLANEOUS) ×6 IMPLANT
ELECT REM PT RETURN 9FT ADLT (ELECTROSURGICAL) ×3
ELECTRODE REM PT RTRN 9FT ADLT (ELECTROSURGICAL) ×1 IMPLANT
EVACUATOR SILICONE 100CC (DRAIN) IMPLANT
GAUZE SPONGE 4X4 12PLY STRL (GAUZE/BANDAGES/DRESSINGS) IMPLANT
GLOVE BIO SURGEON STRL SZ 6.5 (GLOVE) ×4 IMPLANT
GLOVE BIO SURGEONS STRL SZ 6.5 (GLOVE) ×2
GLOVE BIOGEL PI IND STRL 7.0 (GLOVE) ×2 IMPLANT
GLOVE BIOGEL PI INDICATOR 7.0 (GLOVE) ×4
GOWN SPEC L3 XXLG W/TWL (GOWN DISPOSABLE) ×6 IMPLANT
GOWN STRL REUS W/TWL XL LVL3 (GOWN DISPOSABLE) ×12 IMPLANT
KIT BASIN OR (CUSTOM PROCEDURE TRAY) ×6 IMPLANT
LEGGING LITHOTOMY PAIR STRL (DRAPES) IMPLANT
LIGASURE IMPACT 36 18CM CVD LR (INSTRUMENTS) IMPLANT
PACK GENERAL/GYN (CUSTOM PROCEDURE TRAY) ×3 IMPLANT
RELOAD PROXIMATE 75MM BLUE (ENDOMECHANICALS) ×3 IMPLANT
RTRCTR WOUND ALEXIS 18CM MED (MISCELLANEOUS) ×3
SCISSORS LAP 5X35 DISP (ENDOMECHANICALS) ×3 IMPLANT
SEALER TISSUE G2 CVD JAW 35 (ENDOMECHANICALS) IMPLANT
SEALER TISSUE G2 CVD JAW 45CM (ENDOMECHANICALS)
SEALER TISSUE G2 STRG ARTC 35C (ENDOMECHANICALS) ×3 IMPLANT
SEPRAFILM PROCEDURAL PACK 3X5 (MISCELLANEOUS) ×3 IMPLANT
SET IRRIG TUBING LAPAROSCOPIC (IRRIGATION / IRRIGATOR) ×3 IMPLANT
SLEEVE XCEL OPT CAN 5 100 (ENDOMECHANICALS) ×6 IMPLANT
SOLUTION ANTI FOG 6CC (MISCELLANEOUS) ×3 IMPLANT
SPONGE LAP 18X18 X RAY DECT (DISPOSABLE) ×3 IMPLANT
STAPLER PROXIMATE 75MM BLUE (STAPLE) ×3 IMPLANT
STAPLER VISISTAT 35W (STAPLE) ×3 IMPLANT
SUCTION POOLE TIP (SUCTIONS) ×3 IMPLANT
SUT ETHILON 2 0 PS N (SUTURE) IMPLANT
SUT NOVA 1 T20/GS 25DT (SUTURE) IMPLANT
SUT NOVA NAB DX-16 0-1 5-0 T12 (SUTURE) ×9 IMPLANT
SUT PDS AB 1 CTX 36 (SUTURE) IMPLANT
SUT PDS AB 1 TP1 96 (SUTURE) IMPLANT
SUT PROLENE 2 0 KS (SUTURE) IMPLANT
SUT SILK 2 0 (SUTURE) ×2
SUT SILK 2 0 SH CR/8 (SUTURE) ×6 IMPLANT
SUT SILK 2-0 18XBRD TIE 12 (SUTURE) ×1 IMPLANT
SUT SILK 3 0 (SUTURE) ×2
SUT SILK 3 0 SH CR/8 (SUTURE) ×3 IMPLANT
SUT SILK 3-0 18XBRD TIE 12 (SUTURE) ×1 IMPLANT
SUT VIC AB 2-0 SH 18 (SUTURE) IMPLANT
SUT VIC AB 4-0 PS2 18 (SUTURE) ×3 IMPLANT
SUT VIC AB 4-0 PS2 27 (SUTURE) IMPLANT
SUT VICRYL 2 0 18  UND BR (SUTURE)
SUT VICRYL 2 0 18 UND BR (SUTURE) IMPLANT
SYR BULB IRRIGATION 50ML (SYRINGE) IMPLANT
SYS LAPSCP GELPORT 120MM (MISCELLANEOUS)
SYSTEM LAPSCP GELPORT 120MM (MISCELLANEOUS) IMPLANT
TOWEL OR 17X26 10 PK STRL BLUE (TOWEL DISPOSABLE) ×6 IMPLANT
TOWEL OR NON WOVEN STRL DISP B (DISPOSABLE) ×6 IMPLANT
TRAY FOLEY CATH 14FRSI W/METER (CATHETERS) ×3 IMPLANT
TRAY LAP CHOLE (CUSTOM PROCEDURE TRAY) ×3 IMPLANT
TROCAR BLADELESS OPT 5 100 (ENDOMECHANICALS) ×3 IMPLANT
TROCAR XCEL 12X100 BLDLESS (ENDOMECHANICALS) IMPLANT
TROCAR XCEL BLUNT TIP 100MML (ENDOMECHANICALS) ×3 IMPLANT
TROCAR XCEL NON-BLD 11X100MML (ENDOMECHANICALS) IMPLANT
TUBING FILTER THERMOFLATOR (ELECTROSURGICAL) ×3 IMPLANT
TUBING INSUFFLATION 10FT LAP (TUBING) ×3 IMPLANT
YANKAUER SUCT BULB TIP 10FT TU (MISCELLANEOUS) ×3 IMPLANT

## 2014-06-30 NOTE — Transfer of Care (Signed)
Immediate Anesthesia Transfer of Care Note  Patient: Matthew [name]  Procedure(s) Performed: Procedure(s): LAPAROSCOPIC right COLECTOMY (N/A)  Patient Location: PACU  Anesthesia Type:General  Level of Consciousness: awake, alert  and oriented  Airway & Oxygen Therapy: Patient Spontanous Breathing and Patient connected to face mask oxygen  Post-op Assessment: Report given to PACU RN and Post -op Vital signs reviewed and stable  Post vital signs: Reviewed and stable  Complications: No apparent anesthesia complications

## 2014-06-30 NOTE — Interval H&P Note (Signed)
History and Physical Interval Note:  06/30/2014 7:07 AM  Matthew [name]  has presented today for surgery, with the diagnosis of colon cancer  The various methods of treatment have been discussed with the patient and family. After consideration of risks, benefits and other options for treatment, the patient has consented to  Procedure(s): LAPAROSCOPIC PARTIAL COLECTOMY (N/A) as a surgical intervention .  The patient's history has been reviewed, patient examined, no change in status, stable for surgery.  I have reviewed the patient's chart and labs.  Questions were answered to the patient's satisfaction.     [name], MD  [facility]

## 2014-06-30 NOTE — Progress Notes (Signed)
CARE MANAGEMENT NOTE 06/30/2014  Patient:  Matthew [name], Matthew [name]   Account Number:  [id number]  Date Initiated:  06/30/2014  Documentation initiated by:  Olga Coaster  Subjective/Objective Assessment:   ADMITTED FOR SURGERY     Action/Plan:   CM FOLLOWING FOR DCP   Anticipated DC Date:  07/07/2014   Anticipated DC Plan:  Home  CM consult          Status of service:  In process, will continue to follow Medicare Important Message given?   (If response is "NO", the following Medicare IM given date fields will be blank)  Per UR Regulation:  Reviewed for med. necessity/level of care/duration of stay  Comments:  7/17/2015Mindi Slicker RN,BSN,MHA [phone]

## 2014-06-30 NOTE — Anesthesia Preprocedure Evaluation (Signed)
Anesthesia Evaluation  Patient identified by MRN, date of birth, ID band Patient awake    Reviewed: Allergy & Precautions, H&P , NPO status , Patient's Chart, lab work & pertinent test results  History of Anesthesia Complications Negative for: history of anesthetic complications  Airway Mallampati: II TM Distance: >3 FB Neck ROM: Full    Dental  (+) Dental Advisory Given   Pulmonary neg pulmonary ROS, COPD COPD inhaler, former smoker,  breath sounds clear to auscultation        Cardiovascular negative cardio ROS  Rhythm:Regular Rate:Normal     Neuro/Psych negative neurological ROS  negative psych ROS   GI/Hepatic Neg liver ROS, GERD-  Medicated,  Endo/Other  negative endocrine ROS  Renal/GU negative Renal ROS     Musculoskeletal negative musculoskeletal ROS (+)   Abdominal   Peds  Hematology negative hematology ROS (+)   Anesthesia Other Findings   Reproductive/Obstetrics negative OB ROS                           Anesthesia Physical Anesthesia Plan  ASA: II  Anesthesia Plan: General   Post-op Pain Management:    Induction: Intravenous  Airway Management Planned: Oral ETT  Additional Equipment:   Intra-op Plan:   Post-operative Plan: Extubation in OR  Informed Consent: I have reviewed the patients History and Physical, chart, labs and discussed the procedure including the risks, benefits and alternatives for the proposed anesthesia with the patient or authorized representative who has indicated his/her understanding and acceptance.   Dental advisory given  Plan Discussed with: CRNA  Anesthesia Plan Comments:         Anesthesia Quick Evaluation

## 2014-06-30 NOTE — Progress Notes (Signed)
PACU note----Dr. Marcello Moores in to check pt; abdominal honeycomb dressing saturated with serosanguinous fluid; dressing removed and checked by Dr. Marcello Moores; new dressing applied

## 2014-06-30 NOTE — Op Note (Signed)
06/29/2014 - 06/30/2014  10:25 AM  PATIENT:  Matthew [name]  68 y.o. male  Patient Care Team: Cleophas Dunker, MD as PCP - General (Nurse Practitioner)  PRE-OPERATIVE DIAGNOSIS:  colon cancer  POST-OPERATIVE DIAGNOSIS:  colon cancer  PROCEDURE:  LAPAROSCOPIC RIGHT COLECTOMY  SURGEON:  Surgeon(s): [name], MD  ASSISTANT: Rosenbower   ANESTHESIA:   local and general  EBL:  Total I/O In: 2000 [I.V.:2000] Out: 1150 [Urine:800; Blood:350]  SPECIMEN:  Source of Specimen:  R colon  DISPOSITION OF SPECIMEN:  PATHOLOGY  COUNTS:  YES  PLAN OF CARE: Admit to inpatient   PATIENT DISPOSITION:  PACU - hemodynamically stable.   INDICATIONS: This is a 68 y.o. male who presented to my office with a right sided colon cancer found on colonoscopy. The risk and benefits and alternative treatments were explained to the patient prior to the OR and the patient has elected to proceed with laparoscopic right colectomy.  Consent was signed and placed on chart prior to the OR.  OR FINDINGS:  No signs of metastatic disease.  DESCRIPTION:  The patient was identified & brought into the operating room. The patient was positioned supine with left arm tucked. SCDs were active during the entire case. The patient underwent general anesthesia without any difficulty. A foley catheter was inserted under sterile conditions. The abdomen was prepped and draped in a sterile fashion. A Surgical Timeout confirmed our plan.  I made a vertical incision through the superior umbilical fold. I dissected down through the subcutaneous tissues using blunt dissection and elevated the fascia with 2 Kocher clamps.  The fascia was incised with a scalpel.  Blunt dissection was used to obtain peritoneal entry. I placed a stay suture and then the [facility] port. We induced carbon dioxide insufflation. Camera inspection revealed no injury. I placed additional ports under direct laparoscopic visualization.  I evaluated the entire  abdomen laparoscopically.  The liver appeared normal, except for some nodularity, and the large and small bowel were normal as well.  There were no signs of metastatic disease.  I began by identifying the ileocolic artery and vein within the mesentery. Dissection was bluntly carried around these structures. The duodenum was identified and free from the structures. I then separated the structures bluntly and used the Enseal device to transect these separately.  I developed the retroperitoneal plane bluntly.  I then freed the appendix off its attachments to the pelvic wall. I mobilized the terminal ileum.  I took care to avoid injuring any retroperitoneal structures.  After this I began to mobilize laterally down the white line of Toldt and then took down the hepatic flexure using the Enseal device. I mobilized the omentum off of the right transverse colon.  I could not mobilize any more due to the bulky size of the mass.  At that point, I made an enlargement of my umbilical incision using a scalpel. The fascia was then incised using electrocautery. An alexis wound protector was placed. The entire colon was then flipped medially and mobilized off of the retroperitoneal structures bluntly until I could visualize the lateral edge of the duodenum underneath.  I gently freed the duodenal attachments. The terminal ileum and right colon were then removed from the wound. The terminal ileum was transected using a GIA blue load stapler. The remaining mesentery was divided using the Enseal device.  The mesentery was edematous and some residual bleeding was controlled with 2-0 silk sutures.  I then identified a portion of the transverse colon  just distal to the hepatic flexure and proximal to the right branch of the middle colic artery. This was transected using another blue load GIA stapler.  An anastomosis was created between the terminal ileum and the transverse colon. This was done using a GIA blue load stapler.  The  common enterotomy channel was closed using a TA 60 blue load stapler. Hemostasis was good at the staple line. Several 3-0 silk sutures were used to imbricate the edge of the anastomosis. An anti-tension suture was placed in the crotch of the anastomosis. This was then placed back into the abdomen. The abdomen was then irrigated with normal saline.  I had anesthesia inject methylene blue to identify any injury to the ureter.  Once there was blue dye in the foley, I checked for any seepage in the retroperitoneum.  I saw none.  Hemostasis was good after packing the retroperitoneum. The omentum was then brought down over the anastomosis. The Alexis wound protector was removed, and we switched to clean instruments, gowns and drapes.  The fascia was then closed using interrupted #1 Novafil interrupted sutures.  The subcutaneous tissue of the umbilical incision was closed using interrupted 2-0 Vicryl sutures. The skin was then closed using 4-0 Vicryl sutures. Dermabond was placed on the port sites and a sterile dressing was placed over the abdominal incision. All counts were correct per operating room staff. The patient was then awakened from anesthesia and sent to the post anesthesia care unit in stable condition.

## 2014-06-30 NOTE — Anesthesia Postprocedure Evaluation (Signed)
Anesthesia Post Note  Patient: Matthew [name]  Procedure(s) Performed: Procedure(s) (LRB): LAPAROSCOPIC right COLECTOMY (N/A)  Anesthesia type: General  Patient location: PACU  Post pain: Pain level controlled  Post assessment: Post-op Vital signs reviewed  Last Vitals: BP 133/86  Pulse 101  Temp(Src) 36.6 C (Axillary)  Resp 15  Ht 5\' 9"  (1.753 m)  Wt 175 lb 1.6 oz (79.425 kg)  BMI 25.85 kg/m2  SpO2 94%  Post vital signs: Reviewed  Level of consciousness: sedated  Complications: No apparent anesthesia complications

## 2014-06-30 NOTE — Progress Notes (Signed)
INITIAL NUTRITION ASSESSMENT  DOCUMENTATION CODES Per approved criteria  -Not Applicable   INTERVENTION: -Recommend Ensure Complete once daily as tolerated -Diet advancement per MD -Will continue to monitor  NUTRITION DIAGNOSIS: Inadequate oral intake related to inability to eat as evidenced by NPO status.   Goal: Pt to meet >/= 90% of their estimated nutrition needs    Monitor:  Diet order, total protein/energy intake, labs, weight, GI profile  Reason for Assessment: MST  68 y.o. male  Admitting Dx: <principal problem not specified>  ASSESSMENT: Matthew [name] is a 68 y.o. (DOB: 20-May-1946) white male who is a patient of CASSADA, PEGGY, MD and comes to the [facility] with nausea and vomiting while taking his colon prep. He also had chest and abdominal pain and thought he was having a heart attack.  -Pt NPO s/p right colectomy and confused/agitated, wife tending to pt at bedside while daughter answered RD questions. Nutrition focused physical exam not performed. -Daughter endorsed an unintentional wt loss of 5-10 lbs in approximately 3 weeks since colon cancer dx on 06/06/3014 (3-5% body weight loss, significant for time frame if 10 lbs). Pt normally has healthy appetite, however daughter noted a slight decrease in PO intake d/t to nausea and some abd pain -MD noted pt with possible ileus vs SBO. NGT on LIS.  -Discussed addition of Ensure/Boost supplementation for weight maintenance as diet advancement tolerated. Pt has not tried supplement before, but family in agreement to promote Ensure/Boost for sustaining weight and muscle mass  Height: Ht Readings from Last 1 Encounters:  06/29/14 5\' 9"  (1.753 m)    Weight: Wt Readings from Last 1 Encounters:  06/29/14 175 lb 1.6 oz (79.425 kg)    Ideal Body Weight: 160 lbs  % Ideal Body Weight: 109%  Wt Readings from Last 10 Encounters:  06/29/14 175 lb 1.6 oz (79.425 kg)  06/29/14 175 lb 1.6 oz (79.425 kg)  06/23/14 180 lb (81.647  kg)  06/06/14 180 lb 3.2 oz (81.738 kg)  07/26/12 175 lb (79.379 kg)    Usual Body Weight: 187 lbs  % Usual Body Weight: 94%  BMI:  Body mass index is 25.85 kg/(m^2).  Estimated Nutritional Needs: Kcal: 2000-2200 Protein: 95-105 gram Fluid: >/=2000 ml/daily  Skin: WDL  Diet Order: NPO  EDUCATION NEEDS: -No education needs identified at this time   Intake/Output Summary (Last 24 hours) at 06/30/14 1410 Last data filed at 06/30/14 1200  Gross per 24 hour  Intake 4316.67 ml  Output   1450 ml  Net 2866.67 ml    Last BM: 7/16   Labs:   Recent Labs Lab 06/29/14 1910  NA 137  K 4.9  CL 100  CO2 23  BUN 14  CREATININE 1.16  CALCIUM 9.4  GLUCOSE 138*    CBG (last 3)  No results found for this basename: GLUCAP,  in the last 72 hours  Scheduled Meds: . acetaminophen  1,000 mg Oral 4 times per day  . [START ON 07/01/2014] alvimopan  12 mg Oral BID  . cefoTEtan (CEFOTAN) IV  2 g Intravenous Q12H  . citalopram  40 mg Oral QHS  . clonazePAM  0.25 mg Oral BID  . cyclobenzaprine  10 mg Oral TID  . [START ON 07/01/2014] enoxaparin (LOVENOX) injection  40 mg Subcutaneous Q24H  . HYDROmorphone      . HYDROmorphone      . morphine   Intravenous 6 times per day  . pantoprazole  40 mg Oral Daily  .  QUEtiapine  400 mg Oral QHS  . simvastatin  20 mg Oral q1800    Continuous Infusions: . dextrose 5 % and 0.9 % NaCl with KCl 20 mEq/L    . lactated ringers 1,000 mL (06/30/14 1108)    Past Medical History  Diagnosis Date  . Arthritis   . Anxiety   . Panic attack   . Chronic pain     CHRONIC BACK PAIN  . Cancer   . GERD (gastroesophageal reflux disease)   . Hyperlipidemia   . Osteoporosis   . Rectus diastasis   . History of stomach ulcers     WITH GI BLEED  . Complication of anesthesia     "I GET REAL COLD AND CAN'T URINATE"  . COPD (chronic obstructive pulmonary disease)   . Cecal cancer   . History of transfusion   . Bipolar 1 disorder     Past  Surgical History  Procedure Laterality Date  . Back surgery  1983  . Neck surgery  2004    Atlee Abide [facility] [place] Clinical Dietitian [phone]

## 2014-06-30 NOTE — Progress Notes (Signed)
PACU note-----pt's heart rate and blood pressure elevated; Dr. Lissa Hoard notified, order rec'd and med given

## 2014-06-30 NOTE — H&P (View-Only) (Signed)
Chief Complaint  Patient presents with  . Colon Cancer    HISTORY: Matthew [name] is a 68 y.o. male who presents to the office with a newly diagnosed colon cancer.  This was found on screening colonscopy.  He was undergoing a work up for abd pain and nausea.  He denies rectal bleeding.  He has had a 10lb weight loss over the past few weeks.  He reports chronic mid-epigastric pain with associated nausea.  He denies chest pain or SOB with activity.    Past Medical History  Diagnosis Date  . Arthritis   . Anxiety   . Panic attack   . Chronic pain   . Hypertension   . Cancer   . GERD (gastroesophageal reflux disease)   . Hyperlipidemia   . Osteoporosis   .        Past Surgical History  Procedure Laterality Date  . Back surgery  1983  . Neck surgery  2004      Current Outpatient Prescriptions  Medication Sig Dispense Refill  . cyclobenzaprine (FLEXERIL) 10 MG tablet       . EPINEPHrine (EPI-PEN) 0.3 mg/0.3 mL DEVI Inject 0.3 mLs (0.3 mg total) into the muscle once.  2 Device  2  . GAVILYTE-N WITH FLAVOR PACK 420 G solution       . lisinopril (PRINIVIL,ZESTRIL) 5 MG tablet       . omeprazole (PRILOSEC) 20 MG capsule       . oxyCODONE-acetaminophen (PERCOCET/ROXICET) 5-325 MG per tablet       . pravastatin (PRAVACHOL) 40 MG tablet       . diphenhydrAMINE (BENADRYL) 25 mg capsule Take 1 capsule (25 mg total) by mouth 3 (three) times daily.  6 capsule  0  . famotidine (PEPCID) 20 MG tablet Take 1 tablet (20 mg total) by mouth 2 (two) times daily.  4 tablet  0  . metroNIDAZOLE (FLAGYL) 500 MG tablet Take 1 tablet (500 mg total) by mouth as directed.  3 tablet  0   No current facility-administered medications for this visit.      Allergies  Allergen Reactions  . Ibuprofen Shortness Of Breath and Swelling  . Bee Venom       Family History  Problem Relation Age of Onset  . Heart disease Mother       History   Social History  . Marital Status: Married    Spouse Name: N/A     Number of Children: N/A  . Years of Education: N/A   Social History Main Topics  . Smoking status: Never Smoker   . Smokeless tobacco: None  . Alcohol Use: No  . Drug Use: No  . Sexual Activity: No   Other Topics Concern  . None   Social History Narrative  . None       REVIEW OF SYSTEMS - PERTINENT POSITIVES ONLY: Review of Systems - General ROS: negative for - chills or fever Respiratory ROS: no cough, shortness of breath, or wheezing Cardiovascular ROS: no chest pain or dyspnea on exertion Gastrointestinal ROS: pos for abdominal pain, no change in bowel habits, or black or bloody stools Genito-Urinary ROS: no dysuria, trouble voiding, or hematuria  EXAM: Filed Vitals:   06/06/14 0943  BP: 122/75  Pulse: 111  Temp: 98 F (36.7 C)  Resp: 16    Gen:  No acute distress.  Well nourished and well groomed.   Neurological: Alert and oriented to person, place, and time. Coordination normal.  Head:  Normocephalic and atraumatic.  Eyes: Conjunctivae are normal. Pupils are equal, round, and reactive to light. No scleral icterus.  Neck: Normal range of motion. Neck supple. No tracheal deviation or thyromegaly present.  No cervical lymphadenopathy. Cardiovascular: Normal rate, regular rhythm, normal heart sounds and intact distal pulses. Respiratory: Effort normal.  No respiratory distress. No chest wall tenderness. Breath sounds normal.  No wheezes, rales or rhonchi.  GI: Soft. Bowel sounds are normal. The abdomen is soft and nontender.  There is no rebound and no guarding. No hernia, rectus diastasis noted Musculoskeletal: Normal range of motion. Extremities are nontender.  Skin: Skin is warm and dry. No rash noted. No diaphoresis. No erythema. No pallor. No clubbing, cyanosis, or edema.   Psychiatric: Normal mood and affect. Behavior is normal. Judgment and thought content normal.     LABORATORY RESULTS: Available labs are reviewed   CEA: 192.2                                             06/01/14    RADIOLOGY RESULTS:   Images and reports are reviewed. CT CHEST ABD AND PELVIS IMPRESSION:  Irregular wall thickening in the medial cecum with soft tissue stranding in the pericecal fat and small adjacent pericecal lymph nodes. CT imaging features are concerning for transmural tumor extension with local metastatic involvement of lymph nodes.  17 mm low-density lesion in the right kidney has attenuation too high to allow classification as a simple cyst. This may be a cyst complicated by proteinaceous debris or hemorrhage. MRI without and with contrast could be used to more definitively characterize as clinically warranted. 4.8 cm exophytic lesion in the upper pole the left kidney has imaging features compatible with a cyst.     ASSESSMENT AND PLAN: Matthew [name] is a 68 y.o. M with newly diagnosed colon cancer found in his cecum on colonscopy.  His imaging suggest that there is local spread but no signs of obvious metastatic disease.  I have suggested that he undergo a laparoscopic right colectomy.  We discussed this in detail, including possible en bloc resection of any involved surrounding structures.  All questions were answered.  We will get this scheduled as soon as possible.    The surgery and anatomy were described to the patient as well as the risks of surgery and the possible complications.  These include: Bleeding, deep abdominal infections and possible wound complications such as hernia and infection, damage to adjacent structures, leak of surgical connections, which can lead to other surgeries and possibly an ostomy, possible need for other procedures, such as abscess drains in radiology, possible prolonged hospital stay, possible diarrhea from removal of part of the colon, possible constipation from narcotics, prolonged fatigue/weakness or appetite loss, possible early recurrence of of disease, possible complications of their medical problems such as heart  disease or arrhythmias or lung problems, death (less than 1%). I believe the patient understands and wishes to proceed with the surgery.    [name], MD Colon and Rectal Surgery / [facility]      Visit Diagnoses: 1. Colon cancer     Primary Care Physician: No primary provider on file.

## 2014-07-01 LAB — BASIC METABOLIC PANEL
Anion gap: 12 (ref 5–15)
BUN: 15 mg/dL (ref 6–23)
CO2: 24 mEq/L (ref 19–32)
CREATININE: 0.89 mg/dL (ref 0.50–1.35)
Calcium: 8.7 mg/dL (ref 8.4–10.5)
Chloride: 99 mEq/L (ref 96–112)
GFR calc non Af Amer: 87 mL/min — ABNORMAL LOW (ref >90)
Glucose, Bld: 140 mg/dL — ABNORMAL HIGH (ref 70–99)
Potassium: 4.6 mEq/L (ref 3.7–5.3)
Sodium: 135 mEq/L — ABNORMAL LOW (ref 137–147)

## 2014-07-01 LAB — CBC
HCT: 28.4 % — ABNORMAL LOW (ref 39.0–52.0)
Hemoglobin: 9.6 g/dL — ABNORMAL LOW (ref 13.0–17.0)
MCH: 32.2 pg (ref 26.0–34.0)
MCHC: 33.8 g/dL (ref 30.0–36.0)
MCV: 95.3 fL (ref 78.0–100.0)
PLATELETS: 201 10*3/uL (ref 150–400)
RBC: 2.98 MIL/uL — ABNORMAL LOW (ref 4.22–5.81)
RDW: 13.2 % (ref 11.5–15.5)
WBC: 11.3 10*3/uL — ABNORMAL HIGH (ref 4.0–10.5)

## 2014-07-01 NOTE — Evaluation (Signed)
Physical Therapy Evaluation Patient Details Name: DUVALL COMES MRN: [id number] DOB: 02/01/1946 Today's Date: 07/01/2014   History of Present Illness  S/P colectomy for colon cancer on 06/30/14  Clinical Impression  Pt did get to recliner but experienced pain. Pt will benefit from PT to address problems listed in note below.    Follow Up Recommendations No PT follow up    Equipment Recommendations  Rolling walker with 5" wheels (tbd)    Recommendations for Other Services       Precautions / Restrictions Precautions Precaution Comments:  abdomina incision Required Braces or Orthoses: Other Brace/Splint Other Brace/Splint: abdominal binder Restrictions Weight Bearing Restrictions: No      Mobility  Bed Mobility Overal bed mobility: Needs Assistance Bed Mobility: Rolling;Sidelying to Sit Rolling: Mod assist Sidelying to sit: Mod assist       General bed mobility comments: cues for log roll and side to sitting. Pt experienced severe  pain in abdomen during mobility  Transfers Overall transfer level: Needs assistance Equipment used: 1 person hand held assist Transfers: Sit to/from [facility] Sit to Stand: Min assist Stand pivot transfers: Min assist       General transfer comment: extra time for working through pain  Ambulation/Gait                Stairs            Wheelchair Mobility    Modified Rankin (Stroke Patients Only)       Balance                                             Pertinent Vitals/Pain Pain 10, PCA used, pillow provided.     Home Living Family/patient expects to be discharged to:: Private residence Living Arrangements: Spouse/significant other Available Help at Discharge: Family Type of Home: House Home Access: Stairs to enter Entrance Stairs-Rails: None Technical brewer of Steps: 4 Home Layout: One level Home Equipment: None      Prior Function Level of Independence:  Independent               Hand Dominance        Extremity/Trunk Assessment   Upper Extremity Assessment: Overall WFL for tasks assessed           Lower Extremity Assessment: Overall WFL for tasks assessed      Cervical / Trunk Assessment: Normal  Communication   Communication: No difficulties  Cognition Arousal/Alertness: Awake/alert Behavior During Therapy: Anxious;WFL for tasks assessed/performed Overall Cognitive Status: Within Functional Limits for tasks assessed                      General Comments      Exercises        Assessment/Plan    PT Assessment Patient needs continued PT services  PT Diagnosis Difficulty walking;Acute pain   PT Problem List Decreased activity tolerance;Decreased mobility;Pain;Decreased safety awareness;Decreased knowledge of precautions;Decreased knowledge of use of DME  PT Treatment Interventions DME instruction;Gait training;Functional mobility training;Therapeutic activities;Patient/family education   PT Goals (Current goals can be found in the Care Plan section) Acute Rehab PT Goals Patient Stated Goal: I want to try to get up PT Goal Formulation: With patient/family Time For Goal Achievement: 07/15/14 Potential to Achieve Goals: Good    Frequency Min 3X/week   Barriers to discharge  Co-evaluation               End of Session   Activity Tolerance: Patient limited by pain Patient left: in chair;with call bell/phone within reach;with family/visitor present Nurse Communication: Mobility status, RN notified that PT did not place abdominal binder. RN reports it will be placed  After pt returns to bed.         Time: 1206-1222 PT Time Calculation (min): 16 min   Charges:   PT Evaluation $Initial PT Evaluation Tier I: 1 Procedure PT Treatments $Therapeutic Activity: 8-22 mins   PT G Codes:          Claretha Cooper 07/01/2014, 1:07 PM Tresa Endo PT [phone]

## 2014-07-01 NOTE — Progress Notes (Addendum)
1 Day Post-Op Lap R colectomy  Subjective: Doing ok, pain controlled.  Having some drainage from his wound  Objective: Vital signs in last 24 hours: Temp:  [97.5 F (36.4 C)-98.6 F (37 C)] 97.6 F (36.4 C) (07/18 0520) Pulse Rate:  [94-115] 109 (07/18 0520) Resp:  [12-26] 24 (07/18 0520) BP: (87-184)/(52-113) 87/52 mmHg (07/18 0520) SpO2:  [94 %-100 %] 94 % (07/18 0520)   Intake/Output from previous day: 07/17 0701 - 07/18 0700 In: 4183.8 [P.O.:60; I.V.:4123.8] Out: 3050 [Urine:2250; Emesis/NG output:450; Blood:350] Intake/Output this shift:     General appearance: alert and cooperative GI: soft, non-distended, appropriately tender  Incision: blood tinged drainage noted  Lab Results:   Recent Labs  06/29/14 1910 07/01/14 0605  WBC 10.8* 11.3*  HGB 11.8* 9.6*  HCT 34.9* 28.4*  PLT 248 201   BMET  Recent Labs  06/29/14 1910 07/01/14 0605  NA 137 135*  K 4.9 4.6  CL 100 99  CO2 23 24  GLUCOSE 138* 140*  BUN 14 15  CREATININE 1.16 0.89  CALCIUM 9.4 8.7   PT/INR No results found for this basename: LABPROT, INR,  in the last 72 hours ABG No results found for this basename: PHART, PCO2, PO2, HCO3,  in the last 72 hours  MEDS, Scheduled . alvimopan  12 mg Oral BID  . citalopram  40 mg Oral QHS  . clonazePAM  0.25 mg Oral BID  . cyclobenzaprine  10 mg Oral TID  . enoxaparin (LOVENOX) injection  40 mg Subcutaneous Q24H  . morphine   Intravenous 6 times per day  . pantoprazole  40 mg Oral Daily  . QUEtiapine  400 mg Oral QHS  . simvastatin  20 mg Oral q1800    Studies/Results: Dg Abd Acute W/chest  06/29/2014   CLINICAL DATA:  Abdominal pain.  Nausea.  Bloating.  COPD.  EXAM: ACUTE ABDOMEN SERIES (ABDOMEN 2 VIEW & CHEST 1 VIEW)  COMPARISON:  CTs of 06/02/14.  FINDINGS: Frontal view of the chest demonstrates presumed external artifact projecting over the trachea. Midline trachea. Normal heart size, with atherosclerosis in the transverse aorta. No pleural  effusion or pneumothorax.  Right base scarring head.  Abdominal films demonstrate small bowel air-fluid levels throughout the upper abdomen on upright positioning. No free intraperitoneal air.  Supine imaging demonstrates gas within mildly dilated small bowel in left upper quadrant. 3.2 cm. Paucity of mid abdominal small bowel loops. There is likely sigmoid colonic gas. Phleboliths.  IMPRESSION: No acute cardiopulmonary disease.  Small bowel air-fluid levels with mild small bowel dilatation. Suspicious for partial small bowel obstruction. Adynamic ileus could look similar. No free intraperitoneal air or other acute complication identified.  Atherosclerosis.   Electronically Signed   By: Abigail Miyamoto M.D.   On: 06/29/2014 19:07   Dg Abd Portable 1v  06/29/2014   CLINICAL DATA:  NG tube placement.  Nausea, vomiting and diarrhea.  EXAM: PORTABLE ABDOMEN - 1 VIEW  COMPARISON:  06/29/2014  FINDINGS: There has been placement of a nasogastric tube which courses into the region of the stomach with tip in the region of the body of the stomach and side-port in the region of the gastric fundus just below the expected level of the gastroesophageal junction. This could be advanced another 5 cm. Remainder the exam is unchanged.  IMPRESSION: Nasogastric tube with tip over the stomach in the left upper quadrant. Side port just below the gastroesophageal junction as the tube could be advanced another 5 cm.  Electronically Signed   By: Marin Olp M.D.   On: 06/29/2014 21:40    Assessment: s/p Procedure(s): LAPAROSCOPIC right COLECTOMY Patient Active Problem List   Diagnosis Date Noted  . Small bowel obstruction 06/29/2014  . Colon cancer 06/06/2014    Doing ok  Plan: d/c NG Sips of clears Ambulate Abd binder for comfort Slightly hypotensive and tachycardic- Hgb as expected, will increase IVF rate and monitor Hold SQ lovenox   LOS: 2 days     .[name], [facility],  [place]   07/01/2014 9:52 AM

## 2014-07-02 LAB — CBC
HCT: 26.9 % — ABNORMAL LOW (ref 39.0–52.0)
Hemoglobin: 8.9 g/dL — ABNORMAL LOW (ref 13.0–17.0)
MCH: 31.8 pg (ref 26.0–34.0)
MCHC: 33.1 g/dL (ref 30.0–36.0)
MCV: 96.1 fL (ref 78.0–100.0)
Platelets: 193 10*3/uL (ref 150–400)
RBC: 2.8 MIL/uL — ABNORMAL LOW (ref 4.22–5.81)
RDW: 13.2 % (ref 11.5–15.5)
WBC: 8.1 10*3/uL (ref 4.0–10.5)

## 2014-07-02 LAB — BASIC METABOLIC PANEL
ANION GAP: 10 (ref 5–15)
BUN: 11 mg/dL (ref 6–23)
CALCIUM: 8.7 mg/dL (ref 8.4–10.5)
CO2: 26 mEq/L (ref 19–32)
Chloride: 100 mEq/L (ref 96–112)
Creatinine, Ser: 0.83 mg/dL (ref 0.50–1.35)
GFR calc Af Amer: >90 mL/min (ref >90)
GFR, EST NON AFRICAN AMERICAN: 90 mL/min — AB (ref >90)
GLUCOSE: 127 mg/dL — AB (ref 70–99)
Potassium: 4.2 mEq/L (ref 3.7–5.3)
SODIUM: 136 meq/L — AB (ref 137–147)

## 2014-07-02 MED ORDER — KCL IN DEXTROSE-NACL 20-5-0.45 MEQ/L-%-% IV SOLN
INTRAVENOUS | Status: DC
  Administered 2014-07-03: via INTRAVENOUS
  Filled 2014-07-02 (×3): qty 1000

## 2014-07-02 NOTE — Progress Notes (Signed)
2 Days Post-Op Lap R colectomy  Subjective: Doing ok, pain controlled.  Ambulating some.   Objective: Vital signs in last 24 hours: Temp:  [97.9 F (36.6 C)-98.3 F (36.8 C)] 98.3 F (36.8 C) (07/18 2055) Pulse Rate:  [102-103] 103 (07/18 2055) Resp:  [16-22] 20 (07/19 0326) BP: (124-128)/(72-76) 124/72 mmHg (07/18 2055) SpO2:  [89 %-100 %] 92 % (07/19 0326) FiO2 (%):  [39 %] 39 % (07/18 1110)   Intake/Output from previous day: 07/18 0701 - 07/19 0700 In: 2670.2 [P.O.:300; I.V.:2370.2] Out: 3500 [Urine:3450; Emesis/NG output:50] Intake/Output this shift: Total I/O In: -  Out: 700 [Urine:700]   General appearance: alert and cooperative GI: soft, non-distended, appropriately tender  Incision: clean, dry, intact  Lab Results:   Recent Labs  07/01/14 0605 07/02/14 0540  WBC 11.3* 8.1  HGB 9.6* 8.9*  HCT 28.4* 26.9*  PLT 201 193   BMET  Recent Labs  07/01/14 0605 07/02/14 0540  NA 135* 136*  K 4.6 4.2  CL 99 100  CO2 24 26  GLUCOSE 140* 127*  BUN 15 11  CREATININE 0.89 0.83  CALCIUM 8.7 8.7   PT/INR No results found for this basename: LABPROT, INR,  in the last 72 hours ABG No results found for this basename: PHART, PCO2, PO2, HCO3,  in the last 72 hours  MEDS, Scheduled . alvimopan  12 mg Oral BID  . citalopram  40 mg Oral QHS  . clonazePAM  0.25 mg Oral BID  . cyclobenzaprine  10 mg Oral TID  . morphine   Intravenous 6 times per day  . pantoprazole  40 mg Oral Daily  . QUEtiapine  400 mg Oral QHS  . simvastatin  20 mg Oral q1800    Studies/Results: No results found.  Assessment: s/p Procedure(s): LAPAROSCOPIC right COLECTOMY Patient Active Problem List   Diagnosis Date Noted  . Small bowel obstruction 06/29/2014  . Colon cancer 06/06/2014    Doing ok  Plan: Cont sips of clears Ambulate Abd binder for comfort Slightly tachycardic, bp better- Hgb as expected, will decrease IVF rate and monitor Hold SQ lovenox again today   LOS:  3 days     .[name], [facility], [place]   07/02/2014 9:43 AM

## 2014-07-03 LAB — CBC
HCT: 28.4 % — ABNORMAL LOW (ref 39.0–52.0)
Hemoglobin: 9.8 g/dL — ABNORMAL LOW (ref 13.0–17.0)
MCH: 32.2 pg (ref 26.0–34.0)
MCHC: 34.5 g/dL (ref 30.0–36.0)
MCV: 93.4 fL (ref 78.0–100.0)
PLATELETS: 228 10*3/uL (ref 150–400)
RBC: 3.04 MIL/uL — ABNORMAL LOW (ref 4.22–5.81)
RDW: 13 % (ref 11.5–15.5)
WBC: 10.5 10*3/uL (ref 4.0–10.5)

## 2014-07-03 LAB — BASIC METABOLIC PANEL
Anion gap: 11 (ref 5–15)
BUN: 11 mg/dL (ref 6–23)
CO2: 25 mEq/L (ref 19–32)
CREATININE: 0.8 mg/dL (ref 0.50–1.35)
Calcium: 8.9 mg/dL (ref 8.4–10.5)
Chloride: 101 mEq/L (ref 96–112)
Glucose, Bld: 120 mg/dL — ABNORMAL HIGH (ref 70–99)
Potassium: 4.3 mEq/L (ref 3.7–5.3)
Sodium: 137 mEq/L (ref 137–147)

## 2014-07-03 MED ORDER — OXYCODONE HCL 5 MG PO TABS
5.0000 mg | ORAL_TABLET | ORAL | Status: DC | PRN
  Administered 2014-07-03: 10 mg via ORAL
  Administered 2014-07-03 (×2): 5 mg via ORAL
  Administered 2014-07-04 – 2014-07-05 (×7): 10 mg via ORAL
  Filled 2014-07-03 (×3): qty 2
  Filled 2014-07-03: qty 1
  Filled 2014-07-03 (×3): qty 2
  Filled 2014-07-03 (×2): qty 1
  Filled 2014-07-03 (×2): qty 2

## 2014-07-03 MED ORDER — MORPHINE SULFATE 2 MG/ML IJ SOLN
2.0000 mg | INTRAMUSCULAR | Status: DC | PRN
  Administered 2014-07-03 – 2014-07-04 (×6): 2 mg via INTRAVENOUS
  Filled 2014-07-03 (×7): qty 1

## 2014-07-03 MED ORDER — ENOXAPARIN SODIUM 40 MG/0.4ML ~~LOC~~ SOLN
40.0000 mg | SUBCUTANEOUS | Status: DC
  Administered 2014-07-03 – 2014-07-05 (×3): 40 mg via SUBCUTANEOUS
  Filled 2014-07-03 (×4): qty 0.4

## 2014-07-03 NOTE — Progress Notes (Signed)
Physical Therapy Treatment Patient Details Name: Matthew [name] MRN: [id number] DOB: 1946-11-25 Today's Date: 07/03/2014    History of Present Illness S/P colectomy for colon cancer on 06/30/14    PT Comments    Pt would like a RW and 3-in-1 at DC. Pt progressing slowly, appears to be in moderate pain for bed mobility.  Follow Up Recommendations  Home health PT (may benefit if DC'd soon, neds to be more mobile)     Equipment Recommendations  Rolling walker with 5" wheels;3in1 (PT)    Recommendations for Other Services       Precautions / Restrictions Precautions Precaution Comments:  abdomina incision Required Braces or Orthoses: Other Brace/Splint Other Brace/Splint: abdominal binder    Mobility  Bed Mobility   Bed Mobility: Rolling;Sidelying to Sit Rolling: Mod assist Sidelying to sit: Mod assist       General bed mobility comments: cues for log roll and side to sitting. Pt experienced severe  pain in abdomen during mobility to sit up  Transfers Overall transfer level: Needs assistance Equipment used: Rolling walker (2 wheeled) Transfers: Sit to/from Stand Sit to Stand: Min assist         General transfer comment: extra time for working through pain  Ambulation/Gait Ambulation/Gait assistance: Min Wellsite geologist (Feet): 50 Feet Assistive device: Rolling walker (2 wheeled)       General Gait Details: decreased speed, assist to turn    Stairs            Wheelchair Mobility    Modified Rankin (Stroke Patients Only)       Balance                                    Cognition Arousal/Alertness: Awake/alert Behavior During Therapy: Anxious                        Exercises      General Comments        Pertinent Vitals/Pain Pain increases with side to sit, then decreased. premedicated    Home Living                      Prior Function            PT Goals (current goals can now be  found in the care plan section) Progress towards PT goals: Progressing toward goals    Frequency  Min 3X/week    PT Plan Current plan remains appropriate;Discharge plan needs to be updated    Co-evaluation             End of Session   Activity Tolerance: Patient tolerated treatment well;Patient limited by fatigue Patient left: in chair;with call bell/phone within reach;with family/visitor present     Time: 5809-9833 PT Time Calculation (min): 14 min  Charges:  $Gait Training: 8-22 mins                    G Codes:      Claretha Cooper 07/03/2014, 1:38 PM Tresa Endo PT [phone]

## 2014-07-03 NOTE — Progress Notes (Signed)
3 Days Post-Op Lap R colectomy  Subjective: Doing better, pain controlled.  Ambulating some. Passing flatus.  Voiding well with foley out  Objective: Vital signs in last 24 hours: Temp:  [97.4 F (36.3 C)-98.6 F (37 C)] 98.1 F (36.7 C) (07/20 0520) Pulse Rate:  [100-120] 120 (07/20 0520) Resp:  [9-28] 24 (07/20 0520) BP: (84-151)/(54-95) 84/54 mmHg (07/20 0520) SpO2:  [92 %-98 %] 93 % (07/20 0520)   Intake/Output from previous day: 07/19 0701 - 07/20 0700 In: 1671.5 [I.V.:1671.5] Out: 1450 [Urine:1450] Intake/Output this shift:     General appearance: alert and cooperative GI: soft, non-distended, appropriately tender  Incision dressing: clean, dry, intact  Lab Results:   Recent Labs  07/02/14 0540 07/03/14 0505  WBC 8.1 10.5  HGB 8.9* 9.8*  HCT 26.9* 28.4*  PLT 193 228   BMET  Recent Labs  07/02/14 0540 07/03/14 0505  NA 136* 137  K 4.2 4.3  CL 100 101  CO2 26 25  GLUCOSE 127* 120*  BUN 11 11  CREATININE 0.83 0.80  CALCIUM 8.7 8.9   PT/INR No results found for this basename: LABPROT, INR,  in the last 72 hours ABG No results found for this basename: PHART, PCO2, PO2, HCO3,  in the last 72 hours  MEDS, Scheduled . alvimopan  12 mg Oral BID  . citalopram  40 mg Oral QHS  . clonazePAM  0.25 mg Oral BID  . cyclobenzaprine  10 mg Oral TID  . pantoprazole  40 mg Oral Daily  . QUEtiapine  400 mg Oral QHS  . simvastatin  20 mg Oral q1800    Studies/Results: No results found.  Assessment: s/p Procedure(s): LAPAROSCOPIC right COLECTOMY Patient Active Problem List   Diagnosis Date Noted  . Small bowel obstruction 06/29/2014  . Colon cancer 06/06/2014    Doing ok  Plan: Advance to clear liquids Ambulate Abd binder for comfort Slightly tachycardic, bp better- Hgb actually up.  Will monitor. Restart SQ lovenox again today   LOS: 4 days     .[name], [facility], [place]   07/03/2014 8:23  AM

## 2014-07-04 ENCOUNTER — Encounter (HOSPITAL_COMMUNITY): Payer: Self-pay | Admitting: General Surgery

## 2014-07-04 MED ORDER — MENTHOL 3 MG MT LOZG
1.0000 | LOZENGE | OROMUCOSAL | Status: DC | PRN
  Filled 2014-07-04 (×2): qty 9

## 2014-07-04 NOTE — Progress Notes (Signed)
4 Days Post-Op Lap R colectomy  Subjective: Doing better, pain controlled.  Ambulating some. Passing flatus and having BM's.  Voiding well with foley out  Objective: Vital signs in last 24 hours: Temp:  [97.7 F (36.5 C)-98.5 F (36.9 C)] 98 F (36.7 C) (07/21 0511) Pulse Rate:  [107-122] 110 (07/21 0511) Resp:  [18-20] 18 (07/21 0511) BP: (95-130)/(65-85) 107/65 mmHg (07/21 0511) SpO2:  [93 %-96 %] 93 % (07/21 0511)   Intake/Output from previous day: 07/20 0701 - 07/21 0700 In: 916 [P.O.:530; I.V.:386] Out: 1125 [Urine:1125] Intake/Output this shift:     General appearance: alert and cooperative GI: soft, non-distended, appropriately tender  Incision dressing: clean, dry, intact  Lab Results:   Recent Labs  07/02/14 0540 07/03/14 0505  WBC 8.1 10.5  HGB 8.9* 9.8*  HCT 26.9* 28.4*  PLT 193 228   BMET  Recent Labs  07/02/14 0540 07/03/14 0505  NA 136* 137  K 4.2 4.3  CL 100 101  CO2 26 25  GLUCOSE 127* 120*  BUN 11 11  CREATININE 0.83 0.80  CALCIUM 8.7 8.9   PT/INR No results found for this basename: LABPROT, INR,  in the last 72 hours ABG No results found for this basename: PHART, PCO2, PO2, HCO3,  in the last 72 hours  MEDS, Scheduled . citalopram  40 mg Oral QHS  . clonazePAM  0.25 mg Oral BID  . cyclobenzaprine  10 mg Oral TID  . enoxaparin (LOVENOX) injection  40 mg Subcutaneous Q24H  . pantoprazole  40 mg Oral Daily  . QUEtiapine  400 mg Oral QHS  . simvastatin  20 mg Oral q1800    Studies/Results: No results found.  Assessment: s/p Procedure(s): LAPAROSCOPIC right COLECTOMY Patient Active Problem List   Diagnosis Date Noted  . Small bowel obstruction 06/29/2014  . Colon cancer 06/06/2014    Doing ok  Plan: Advance to soft diet Ambulate Abd binder for comfort Slightly tachycardic, bp better- Hgb actually up.  Will monitor.  HR is at his baseline per pt. Cont SQ lovenox  Anticipate d/c tom   LOS: 5 days     .[name], MD [facility], [place]   07/04/2014 9:22 AM

## 2014-07-04 NOTE — Progress Notes (Signed)
[facility]  [facility] is providing the following services: RW and Commode  If patient discharges after hours, please call [phone].   Linward Headland 07/04/2014, 3:23 PM

## 2014-07-04 NOTE — Progress Notes (Signed)
Physical Therapy Treatment Patient Details Name: Matthew [name] MRN: [id number] DOB: 19-Jul-1946 Today's Date: 07/04/2014    History of Present Illness S/P colectomy for colon cancer on 06/30/14    PT Comments    Pt received pain prior to visit.  Feeling better.  Assisted OOB to amb in hallway full unit with 2 sitting rest breaks.  "This is the most I have walked here".  Pt stated his ABD pain is better managed and is hopeful to go home tomorrow.  Pt agrees to use his RW when he feels unsteady and advised to use outside.  Follow Up Recommendations  Home health PT     Equipment Recommendations  Rolling walker with 5" wheels;3in1 (PT)    Recommendations for Other Services       Precautions / Restrictions Precautions Precaution Comments:  abdomina incision Required Braces or Orthoses: Other Brace/Splint Other Brace/Splint: abdominal binder Restrictions Weight Bearing Restrictions: No    Mobility  Bed Mobility Overal bed mobility: Modified Independent             General bed mobility comments: with side rolling and use of rail pt was able to transition self from supine to EOB with increased time  Transfers Overall transfer level: Needs assistance Equipment used: Rolling walker (2 wheeled) Transfers: Sit to/from Stand Sit to Stand: Min guard;Supervision         General transfer comment: <25% VC's on proper tech not to pull up on walker, proper hand placement to push off  Ambulation/Gait Ambulation/Gait assistance: Min guard;Min assist Ambulation Distance (Feet): 400 Feet Assistive device: Rolling walker (2 wheeled) Gait Pattern/deviations: Step-to pattern;Step-through pattern Gait velocity: decreased   General Gait Details: <25% VC's on safety with turns using walker throughout   Stairs            Wheelchair Mobility    Modified Rankin (Stroke Patients Only)       Balance                                    Cognition                             Exercises      General Comments        Pertinent Vitals/Pain R LQ ABD pain "better"    Home Living                      Prior Function            PT Goals (current goals can now be found in the care plan section) Progress towards PT goals: Progressing toward goals    Frequency  Min 3X/week    PT Plan      Co-evaluation             End of Session Equipment Utilized During Treatment: Gait belt Activity Tolerance: Patient tolerated treatment well;Patient limited by fatigue Patient left: in bed;with call bell/phone within reach;with family/visitor present     Time: 1505-1530 PT Time Calculation (min): 25 min  Charges:  $Gait Training: 23-37 mins                    G Codes:      Rica Koyanagi  PTA [facility]  Acute  Rehab Pager      [phone]

## 2014-07-04 NOTE — Progress Notes (Signed)
Verbal orders from [facility] MD for home health and rolling walker, order entered Neta Mends RN 07-04-2014 15:19pm

## 2014-07-04 NOTE — Care Management Note (Addendum)
    Page 1 of 2   07/05/2014     8:26:39 AM CARE MANAGEMENT NOTE 07/05/2014  Patient:  Matthew [name], Matthew [name]   Account Number:  [id number]  Date Initiated:  06/30/2014  Documentation initiated by:  Olga Coaster  Subjective/Objective Assessment:   ADMITTED FOR SURGERY     Action/Plan:   CM FOLLOWING FOR DCP   Anticipated DC Date:  07/07/2014   Anticipated DC Plan:  [place]  CM consult      [facility] Choice  HOME HEALTH   Choice offered to / List presented to:  C-1 Patient   DME arranged  3-N-1  Vassie Moselle      DME agency  [place] arranged  Page.   Status of service:  Completed, signed off Medicare Important Message given?  YES (If response is "NO", the following Medicare IM given date fields will be blank) Date Medicare IM given:  06/29/2014 Medicare IM given by:   Date Additional Medicare IM given:  07/04/2014 Additional Medicare IM given by:  [facility] Addalyne Vandehei  Discharge Disposition:  [place]  Per UR Regulation:  Reviewed for med. necessity/level of care/duration of stay  If discussed at [place] of Stay Meetings, dates discussed:    Comments:  07/05/14 07:45 CM notes orders placed.  No other CMneeds were communicated.  Mariane Masters, BSN, Jearld Lesch [phone]. 07/04/14 15:00 CM met with pt in room to offer choice for HHPT.  Pt chooses [facility] to render HHPT service.  Address and contact information verified with pt.  CM requested of MD orders for HHPT, and DME 3n1 and rolling walker as recc. by PT.  CM also requested a F-2-F.  Referral for HHPT texted to [facility] rep, Kristen for planned discharge of tomorrow.  CM called DME rep and requested 3n1 and rolling walker to be delivered to room prior to discharge tomorrow.  [facility] reps aware we are waiting for orders and F2F.  Will continue to monitor to ensre orders and F2F are placed.  Mariane Masters, BSN, [place].   7/17/2015Mindi Slicker RN,BSN,MHA [phone]

## 2014-07-05 ENCOUNTER — Other Ambulatory Visit (INDEPENDENT_AMBULATORY_CARE_PROVIDER_SITE_OTHER): Payer: Self-pay | Admitting: General Surgery

## 2014-07-05 ENCOUNTER — Telehealth (INDEPENDENT_AMBULATORY_CARE_PROVIDER_SITE_OTHER): Payer: Self-pay | Admitting: General Surgery

## 2014-07-05 DIAGNOSIS — C189 Malignant neoplasm of colon, unspecified: Secondary | ICD-10-CM

## 2014-07-05 MED ORDER — OXYCODONE HCL 5 MG PO TABS: 5.0000 mg | ORAL_TABLET | ORAL | Status: DC | PRN

## 2014-07-05 NOTE — Progress Notes (Signed)
Patient and wife verbalized understanding of discharge instructions. Patient was given prescription and d/c forms. Patient is stable at discharge. Patient is accompanied by nursing staff to lobby.

## 2014-07-05 NOTE — Discharge Instructions (Signed)

## 2014-07-05 NOTE — Discharge Summary (Signed)
Physician Discharge Summary  Patient ID: Matthew [name] MRN: [id number] DOB/AGE: 1946/04/01 68 y.o.  Admit date: 06/29/2014 Discharge date: 07/05/2014  Admission Diagnoses: colon cancer, SBO  Discharge Diagnoses:  Principal Problem:   Colon cancer Active Problems:   Small bowel obstruction   Discharged Condition: good  Hospital Course: Patient was admitted the night prior to surgery due to bowel obstruction.  Right colectomy performed the following day as scheduled.  His NG was removed on POD 1.  He was started on clear liquids on POD 3.  His diet was advanced once he began to have bowel function.  By POD 5 he was tolerating a soft diet, his pain was controlled with oral medications and he was ambulating with some difficulty.  He will be discharged home with home health PT.    Consults: None  Significant Diagnostic Studies: labs: cbc, chemistry   Treatments: IV hydration and surgery: right colectomy   Discharge Exam: Blood pressure 106/65, pulse 100, temperature 98.3 F (36.8 C), temperature source Oral, resp. rate 18, height 5\' 9"  (1.753 m), weight 175 lb 1.6 oz (79.425 kg), SpO2 95.00%. General appearance: alert and cooperative GI: normal findings: soft, non-tender Incision/Wound: clean, dry, intact  Disposition: 01-Home or Self Care     Medication List         albuterol 108 (90 BASE) MCG/ACT inhaler  Commonly known as:  PROVENTIL HFA;VENTOLIN HFA  Inhale into the lungs every 6 (six) hours as needed for wheezing or shortness of breath.     citalopram 40 MG tablet  Commonly known as:  CELEXA  Take 40 mg by mouth at bedtime.     clonazePAM 0.5 MG tablet  Commonly known as:  KLONOPIN  Take 0.25 mg by mouth 2 (two) times daily.     cyclobenzaprine 10 MG tablet  Commonly known as:  FLEXERIL  Take 10 mg by mouth 3 (three) times daily.     docusate sodium 100 MG capsule  Commonly known as:  COLACE  Take 200 mg by mouth at bedtime.     EPINEPHrine 0.3 mg/0.3 mL  Soaj injection  Commonly known as:  EPI-PEN  Inject 0.3 mg into the muscle daily as needed (allergic reaction).     FISH OIL PO  Take 1 capsule by mouth daily.     folic acid 1 MG tablet  Commonly known as:  FOLVITE  Take 1 mg by mouth 2 (two) times daily.     multivitamin with minerals Tabs tablet  Take 1 tablet by mouth daily.     omeprazole 20 MG capsule  Commonly known as:  PRILOSEC  Take 20 mg by mouth daily with breakfast.     oxyCODONE 5 MG immediate release tablet  Commonly known as:  Oxy IR/ROXICODONE  Take 1-2 tablets (5-10 mg total) by mouth every 4 (four) hours as needed for moderate pain, severe pain or breakthrough pain.     oxyCODONE-acetaminophen 5-325 MG per tablet  Commonly known as:  PERCOCET/ROXICET  Take 1 tablet by mouth every 4 (four) hours as needed for severe pain.     polyethylene glycol packet  Commonly known as:  MIRALAX / GLYCOLAX  Take 17 g by mouth daily.     pravastatin 40 MG tablet  Commonly known as:  PRAVACHOL  Take 20 mg by mouth at bedtime.     QUEtiapine 400 MG tablet  Commonly known as:  SEROQUEL  Take 400 mg by mouth at bedtime.     VITAMIN D (  CHOLECALCIFEROL) PO  Take 1 capsule by mouth 2 (two) times daily.           Follow-up Information   Follow up with [place]. (rolling walker and 3n1 (commode))    Contact information:   [address] [place] [address] [phone]       Follow up with [place]. (home health physical therapy)    Contact information:   [address] [place] [address] [phone]       Follow up with [name]., MD. Schedule an appointment as soon as possible for a visit in 2 weeks.   Specialty:  General Surgery   Contact information:   [address] [place] [address] [phone]       Signed: [name] 7/32/2025, 4:27 AM

## 2014-07-05 NOTE — Telephone Encounter (Signed)
Spoke with pt and informed him he has an appt w/ Dr. Marcello Moores on 07/19/14 at 10:10 w/ an arrival time of 9:55. Pt verbalized understanding.

## 2014-07-07 DIAGNOSIS — Z483 Aftercare following surgery for neoplasm: Secondary | ICD-10-CM | POA: Diagnosis not present

## 2014-07-07 DIAGNOSIS — IMO0001 Reserved for inherently not codable concepts without codable children: Secondary | ICD-10-CM | POA: Diagnosis not present

## 2014-07-07 DIAGNOSIS — C189 Malignant neoplasm of colon, unspecified: Secondary | ICD-10-CM | POA: Diagnosis not present

## 2014-07-07 DIAGNOSIS — F411 Generalized anxiety disorder: Secondary | ICD-10-CM | POA: Diagnosis not present

## 2014-07-07 DIAGNOSIS — F329 Major depressive disorder, single episode, unspecified: Secondary | ICD-10-CM | POA: Diagnosis not present

## 2014-07-07 DIAGNOSIS — F3289 Other specified depressive episodes: Secondary | ICD-10-CM | POA: Diagnosis not present

## 2014-07-07 DIAGNOSIS — M545 Low back pain, unspecified: Secondary | ICD-10-CM | POA: Diagnosis not present

## 2014-07-07 DIAGNOSIS — F319 Bipolar disorder, unspecified: Secondary | ICD-10-CM | POA: Diagnosis not present

## 2014-07-11 ENCOUNTER — Telehealth: Payer: Self-pay | Admitting: *Deleted

## 2014-07-11 ENCOUNTER — Encounter (INDEPENDENT_AMBULATORY_CARE_PROVIDER_SITE_OTHER): Payer: Self-pay

## 2014-07-11 NOTE — Telephone Encounter (Signed)
Spoke with patient and confirmed appointment with Dr. Benay Spice for 08/03/14.  Contact names, numbers, and directions were provided.

## 2014-07-12 ENCOUNTER — Encounter (HOSPITAL_COMMUNITY): Payer: Self-pay

## 2014-07-14 ENCOUNTER — Telehealth (INDEPENDENT_AMBULATORY_CARE_PROVIDER_SITE_OTHER): Payer: Self-pay

## 2014-07-14 ENCOUNTER — Telehealth: Payer: Self-pay | Admitting: *Deleted

## 2014-07-14 NOTE — Telephone Encounter (Signed)
Spoke with patient by phone.  Dr. Benay Spice would like to see patient on 07/24/14.  Patient agreeable to this.  Contact names, numbers, directions, and appointment time was given and confirmed.

## 2014-07-14 NOTE — Telephone Encounter (Signed)
Debbie w/[facility] wanting to let us know that the pt is refusing 2nd visit from home health b/c he is doing so good after surgery. Debbie advised that she will keep the account open for a little longer since the pt is due to start chemo soon just in case if the pt should need something during that time.

## 2014-07-19 ENCOUNTER — Encounter (INDEPENDENT_AMBULATORY_CARE_PROVIDER_SITE_OTHER): Payer: Self-pay

## 2014-07-19 ENCOUNTER — Encounter (INDEPENDENT_AMBULATORY_CARE_PROVIDER_SITE_OTHER): Payer: Self-pay | Admitting: General Surgery

## 2014-07-19 ENCOUNTER — Ambulatory Visit (INDEPENDENT_AMBULATORY_CARE_PROVIDER_SITE_OTHER): Payer: Medicare Other | Admitting: General Surgery

## 2014-07-19 VITALS — BP 96/60 | HR 64 | Temp 97.6°F | Resp 16 | Ht 69.0 in | Wt 173.8 lb

## 2014-07-19 DIAGNOSIS — C189 Malignant neoplasm of colon, unspecified: Secondary | ICD-10-CM

## 2014-07-19 NOTE — Patient Instructions (Signed)
Ok to eat a regular diet.  No heavy lifting for 8 weeks after surgery.

## 2014-07-19 NOTE — Progress Notes (Signed)
Matthew [name] is a 68 y.o. male who is status post a Lap assisted R hemicolectomy on 06/30/14.  He is doing well.  He is eating well and having BM's.  His pain is minimal.  Objective: Filed Vitals:   07/19/14 1006  BP: 96/60  Pulse: 64  Temp: 97.6 F (36.4 C)  Resp: 16    General appearance: alert and cooperative GI: normal findings: soft, non-tender  Incision: healing well   Assessment: s/p  Patient Active Problem List   Diagnosis Date Noted  . Small bowel obstruction 06/29/2014  . Colon cancer 06/06/2014    Plan: Doing well after surgery.  He is scheduled to see Dr Benay Spice next week.  Advance diet as tolerated.  No heavy lifting for 8 wks after surgery.  RTO in 3 months.  CT showed a possible complex cyst on his R kidney.  Will defer any work up to Dr Benay Spice.     [name], [facility], [place]   07/19/2014 10:18 AM

## 2014-07-24 ENCOUNTER — Ambulatory Visit (HOSPITAL_BASED_OUTPATIENT_CLINIC_OR_DEPARTMENT_OTHER): Payer: Medicare Other | Admitting: Oncology

## 2014-07-24 ENCOUNTER — Telehealth: Payer: Self-pay | Admitting: Oncology

## 2014-07-24 ENCOUNTER — Encounter: Payer: Self-pay | Admitting: Oncology

## 2014-07-24 ENCOUNTER — Ambulatory Visit: Payer: Medicare Other

## 2014-07-24 VITALS — BP 125/78 | HR 90 | Temp 97.8°F | Resp 18 | Ht 69.0 in | Wt 177.5 lb

## 2014-07-24 DIAGNOSIS — C189 Malignant neoplasm of colon, unspecified: Secondary | ICD-10-CM

## 2014-07-24 DIAGNOSIS — C180 Malignant neoplasm of cecum: Secondary | ICD-10-CM

## 2014-07-24 NOTE — Progress Notes (Signed)
[facility] Patient Consult   Referring MD: Goldman Birchall 68 y.o.  [birth year]    Reason for Referral: Colon cancer   HPI: He reports a 2 month history of abdominal pain and constipation. He saw Dr. Amedeo Plenty and was taken to an upper endoscopy 05/30/2014. The upper endoscopy was normal. A colonoscopy the same day confirmed a mass in the cecum. The mass was biopsied and tattooed. The pathology revealed invasive adenocarcinoma involving the cecum mass.  CTs of the chest, abdomen, and pelvis on 06/02/2014 revealed emphysematous changes in the lungs bilaterally. No pulmonary nodule or mass. The liver appeared normal. Irregular wall thickening in the medial cecum with mild distention of the appendix. Several small ileocolic nodes adjacent to the cecum. A 17 mm low-density lesion in the right kidney-indeterminate.  He was referred to Dr. Marcello Moores and was taken to a laparoscopic right colectomy 06/30/2014. There was no evidence of metastatic disease. An anastomosis was created between the terminal ileum and transverse colon.  The pathology [id number]) revealed an invasive well to moderately differentiated adenocarcinoma. Tumor invaded through the muscular propria into pericolic tissues. The resection margins were negative. 0 of 13 lymph nodes were positive for metastatic carcinoma. A single tumor deposit was identified adjacent to a lymph node. No macroscopic tumor perforation. A hyperplastic polyp and 2 tubular adenomas were present. There is loss of MLH1 expression and equivocal expression of PMS2. The tumor returned microsatellite instability-high.  He has recovered from surgery.  Past Medical History  Diagnosis Date  . Arthritis-osteoarthritis affecting the back    . Anxiety   . Panic attack   . Chronic pain     CHRONIC BACK PAIN  .    Marland Kitchen GERD (gastroesophageal reflux disease)   . Hyperlipidemia   . Osteoporosis   . Rectus diastasis   . History of  stomach ulcers     WITH GI BLEED  . Complication of anesthesia     "I GET REAL COLD AND CAN'T URINATE"  . COPD (chronic obstructive pulmonary disease)   . Cecal cancer (T3 N1-single tumor deposit)   06/30/2014   . History of transfusion   . Bipolar 1 disorder     Past Surgical History  Procedure Laterality Date  . Back surgery  1983  . Neck surgery  2004  . Laparoscopic partial colectomy N/A 06/30/2014    Procedure: LAPAROSCOPIC right COLECTOMY;  Surgeon: [name], MD;  Location: [facility];  Service: General;  Laterality: N/A;    Medications: Reviewed  Allergies:  Allergies  Allergen Reactions  . Bee Venom Anaphylaxis  . Ibuprofen Shortness Of Breath and Swelling    Family history: 2 sisters, 2 children. No family history of cancer. 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 He is disabled secondary to chronic back pain. He quit smoking cigarettes 10-12 years ago after smoking for 40 years. He does not use alcohol. He was transfused when he had a bleeding ulcer. No risk factor for HIV or hepatitis.   ROS:   Positives include: Constipation and abdominal pain prior to surgery, chronic back pain secondary to osteoarthritis, occasional night sweats  A complete ROS was otherwise negative.  Physical Exam:  Blood pressure 125/78, pulse 90, temperature 97.8 F (36.6 C), temperature source Oral, resp. rate 18, height 5' 9"  (1.753 m), weight 177 lb 8 oz (80.513 kg), SpO2 100.00%.  HEENT:  Oropharynx without visible mass, neck without mass Lungs: End inspiratory bronchial sounds at the left upper posterior chest, no respiratory distress Cardiac: Regular rate and rhythm Abdomen: Healed surgical incisions, no hepatosplenomegaly, no mass GU: Testes without mass  Vascular: No leg edema Lymph nodes: No cervical, supraclavicular, axillary, or inguinal nodes Neurologic:  Alert and oriented, the motor exam appears intact in the upper and lower extremities Skin: No rash Musculoskeletal: Mild tenderness at the low back   LAB:  CBC  Lab Results  Component Value Date   WBC 10.5 07/03/2014   HGB 9.8* 07/03/2014   HCT 28.4* 07/03/2014   MCV 93.4 07/03/2014   PLT 228 07/03/2014   NEUTROABS 9.1* 06/29/2014     CMP      Component Value Date/Time   NA 137 07/03/2014 0505   K 4.3 07/03/2014 0505   CL 101 07/03/2014 0505   CO2 25 07/03/2014 0505   GLUCOSE 120* 07/03/2014 0505   BUN 11 07/03/2014 0505   CREATININE 0.80 07/03/2014 0505   CALCIUM 8.9 07/03/2014 0505   PROT 7.3 06/29/2014 1910   ALBUMIN 3.7 06/29/2014 1910   AST 23 06/29/2014 1910   ALT 18 06/29/2014 1910   ALKPHOS 114 06/29/2014 1910   BILITOT 0.4 06/29/2014 1910   GFRNONAA >90 07/03/2014 0505   GFRAA >90 07/03/2014 0505    CEA on 06/01/2014-192.2  Imaging:  As per history of present illness, CT 06/02/2012 reviewed   Assessment/Plan:   1. Stage IIIB (T3 N1c) well to moderately differentiated adenocarcinoma of the cecum, status post a right colectomy 06/30/2014  Microsatellite instability-High, loss of MLH1 and equivocal  loss of PMS2 expression  Markedly elevated preoperative CEA  2.  history of tobacco use/CT evidence of COPD  3.  history of peptic ulcer disease  4.  history of colon polyps, multiple polyps in the 06/30/2014 resection specimen  5.  chronic back pain/arthritis   Disposition:   Mr. Want has been diagnosed with stage III colon cancer. We reviewed the prognosis and adjuvant treatment options. We discussed the details of the surgical pathology report. I recommend adjuvant chemotherapy based on the surgical staging and markedly elevated preoperative CEA. We discussed the benefits associated with adjuvant 5-fluorouracil and oxaliplatin chemotherapy. We discussed the details of the CAPOX and FOLFOX regimens. He is most comfortable with FOLFOX chemotherapy.  We discussed  potential toxicities associated with the FOLFOX regimen including the chance for nausea/vomiting, mucositis, diarrhea, alopecia, and hematologic toxicity. We reviewed the rash, hyperpigmentation, and hand/foot syndrome associated with 5-fluorouracil. We discussed the allergic reaction and various types of neuropathy seen with oxaliplatin. He agrees to proceed. He will attend a chemotherapy teaching class.  We will check a CEA prior to beginning chemotherapy.  Mr. Purdum will be referred to Dr. Marcello Moores for placement of a Port-A-Cath. A first cycle of FOLFOX is scheduled for 08/02/2014.  The tumor returned microsatellite instability-high with loss of MLH1 and PMS2 expression. This pattern is most commonly seen in sporadic colon cancers with hypermethylation events. We will check for a BRAF mutation and then order hypermethylation testing as indicated.  Approximately 50 minutes were spent with the patient today. The majority of the time was used for counseling and coordination of care.    [place], [place] 07/24/2014, 2:56 PM

## 2014-07-24 NOTE — Progress Notes (Signed)
Checked in new patient with no financial issues. He has not been out of the country and he has appt card.

## 2014-07-24 NOTE — Progress Notes (Signed)
Met with Matthew [name] and family. Explained role of nurse navigator. Educational information provided on colorectal cancer.  [place] resources provided to patient, including SW service, dietician, and GI support group  information. Patient will consider taking advantage of [place] resources.  Contact names and phone numbers were provided for entire [facility] team.  Teach back method was used.  Spoke with [name] in pathology dept. re: order from Dr. Benay Spice to perform BRAF testing on Accession: [id number].  Will continue to follow as needed.

## 2014-07-24 NOTE — Telephone Encounter (Signed)
Pt confirmed labs/ov per 08/10 POF and referral cld, gave pt AVS....KJ

## 2014-07-24 NOTE — Patient Instructions (Signed)
Please provide the office with a copy of your Advanced Directive-Healthcare POA and/or living will when available.

## 2014-07-25 ENCOUNTER — Other Ambulatory Visit: Payer: Self-pay | Admitting: *Deleted

## 2014-07-25 ENCOUNTER — Telehealth: Payer: Self-pay | Admitting: *Deleted

## 2014-07-25 ENCOUNTER — Telehealth: Payer: Self-pay | Admitting: Nurse Practitioner

## 2014-07-25 ENCOUNTER — Other Ambulatory Visit (HOSPITAL_COMMUNITY)
Admission: RE | Admit: 2014-07-25 | Discharge: 2014-07-25 | Disposition: A | Discharge status: Home / Self Care | Payer: Medicare Other | Source: Ambulatory Visit | Attending: Oncology | Admitting: Oncology

## 2014-07-25 DIAGNOSIS — C189 Malignant neoplasm of colon, unspecified: Secondary | ICD-10-CM | POA: Insufficient documentation

## 2014-07-25 MED ORDER — PROCHLORPERAZINE MALEATE 10 MG PO TABS
10.0000 mg | ORAL_TABLET | Freq: Four times a day (QID) | ORAL | Status: DC | PRN
Start: 2014-07-25 — End: 2019-01-20

## 2014-07-25 MED ORDER — LIDOCAINE-PRILOCAINE 2.5-2.5 % EX CREA: 1.0000 "application " | TOPICAL_CREAM | CUTANEOUS | Status: DC | PRN

## 2014-07-25 NOTE — Telephone Encounter (Signed)
Per request of Dr. Sherrill, spoke with J. Epps in pathology re: need for [employer] to correct the collection date on the [employer] report for MSI testing to read 06/30/14 and not 06/20/14 on Accession # [id number].  She will contact the [employer] rep. to amend the report. 

## 2014-07-26 ENCOUNTER — Encounter (HOSPITAL_BASED_OUTPATIENT_CLINIC_OR_DEPARTMENT_OTHER): Payer: Self-pay | Admitting: *Deleted

## 2014-07-26 ENCOUNTER — Encounter (HOSPITAL_COMMUNITY): Payer: Self-pay

## 2014-07-26 ENCOUNTER — Telehealth: Payer: Self-pay | Admitting: *Deleted

## 2014-07-26 NOTE — Progress Notes (Signed)
No labs needed

## 2014-07-26 NOTE — Telephone Encounter (Signed)
Per staff message and POF I have scheduled appts. Advised scheduler of appts. JMW  

## 2014-07-27 ENCOUNTER — Telehealth (INDEPENDENT_AMBULATORY_CARE_PROVIDER_SITE_OTHER): Payer: Self-pay | Admitting: *Deleted

## 2014-07-27 ENCOUNTER — Other Ambulatory Visit: Payer: Medicare Other

## 2014-07-27 ENCOUNTER — Encounter: Payer: Self-pay | Admitting: *Deleted

## 2014-07-27 NOTE — Telephone Encounter (Signed)
Tressia Danas, Physical Therapist with [facility] called to let you know that they went to see pt and done their initial assessment.  They went back to do Physical Therapy on the pt and the pt refused PT services.  Suezanne Jacquet said to let you know that the pt states that he is doing very well.  Anderson Malta

## 2014-07-30 ENCOUNTER — Other Ambulatory Visit: Payer: Self-pay | Admitting: Oncology

## 2014-07-31 ENCOUNTER — Ambulatory Visit (HOSPITAL_COMMUNITY): Payer: Medicare Other

## 2014-07-31 ENCOUNTER — Ambulatory Visit (HOSPITAL_BASED_OUTPATIENT_CLINIC_OR_DEPARTMENT_OTHER)
Admission: RE | Admit: 2014-07-31 | Discharge: 2014-07-31 | Disposition: A | Discharge status: Home / Self Care | Payer: Medicare Other | Source: Ambulatory Visit | Attending: General Surgery | Admitting: General Surgery

## 2014-07-31 ENCOUNTER — Ambulatory Visit (HOSPITAL_BASED_OUTPATIENT_CLINIC_OR_DEPARTMENT_OTHER): Payer: Medicare Other | Admitting: Anesthesiology

## 2014-07-31 ENCOUNTER — Encounter (HOSPITAL_BASED_OUTPATIENT_CLINIC_OR_DEPARTMENT_OTHER): Payer: Medicare Other | Admitting: Anesthesiology

## 2014-07-31 ENCOUNTER — Encounter (HOSPITAL_BASED_OUTPATIENT_CLINIC_OR_DEPARTMENT_OTHER): Payer: Self-pay

## 2014-07-31 ENCOUNTER — Encounter (HOSPITAL_BASED_OUTPATIENT_CLINIC_OR_DEPARTMENT_OTHER)
Admission: RE | Disposition: A | Discharge status: Home / Self Care | Payer: Self-pay | Source: Ambulatory Visit | Attending: General Surgery

## 2014-07-31 ENCOUNTER — Encounter (HOSPITAL_COMMUNITY): Payer: Self-pay

## 2014-07-31 DIAGNOSIS — C189 Malignant neoplasm of colon, unspecified: Secondary | ICD-10-CM | POA: Insufficient documentation

## 2014-07-31 DIAGNOSIS — F410 Panic disorder [episodic paroxysmal anxiety] without agoraphobia: Secondary | ICD-10-CM | POA: Insufficient documentation

## 2014-07-31 DIAGNOSIS — J4489 Other specified chronic obstructive pulmonary disease: Secondary | ICD-10-CM | POA: Diagnosis not present

## 2014-07-31 DIAGNOSIS — G8929 Other chronic pain: Secondary | ICD-10-CM | POA: Insufficient documentation

## 2014-07-31 DIAGNOSIS — M810 Age-related osteoporosis without current pathological fracture: Secondary | ICD-10-CM | POA: Diagnosis not present

## 2014-07-31 DIAGNOSIS — E785 Hyperlipidemia, unspecified: Secondary | ICD-10-CM | POA: Insufficient documentation

## 2014-07-31 DIAGNOSIS — Z452 Encounter for adjustment and management of vascular access device: Secondary | ICD-10-CM | POA: Diagnosis not present

## 2014-07-31 DIAGNOSIS — F319 Bipolar disorder, unspecified: Secondary | ICD-10-CM | POA: Insufficient documentation

## 2014-07-31 DIAGNOSIS — K219 Gastro-esophageal reflux disease without esophagitis: Secondary | ICD-10-CM | POA: Diagnosis not present

## 2014-07-31 DIAGNOSIS — J449 Chronic obstructive pulmonary disease, unspecified: Secondary | ICD-10-CM | POA: Insufficient documentation

## 2014-07-31 DIAGNOSIS — Z87891 Personal history of nicotine dependence: Secondary | ICD-10-CM | POA: Diagnosis not present

## 2014-07-31 HISTORY — PX: PORTACATH PLACEMENT: SHX2246

## 2014-07-31 HISTORY — DX: Presence of dental prosthetic device (complete) (partial): Z97.2

## 2014-07-31 LAB — POCT HEMOGLOBIN-HEMACUE: Hemoglobin: 12 g/dL — ABNORMAL LOW (ref 13.0–17.0)

## 2014-07-31 SURGERY — INSERTION, TUNNELED CENTRAL VENOUS DEVICE, WITH PORT
Anesthesia: General | Site: Chest | Laterality: Left

## 2014-07-31 MED ORDER — MIDAZOLAM HCL 2 MG/2ML IJ SOLN: 1.0000 mg | INTRAMUSCULAR | Status: DC | PRN

## 2014-07-31 MED ORDER — DEXAMETHASONE SODIUM PHOSPHATE 4 MG/ML IJ SOLN
INTRAMUSCULAR | Status: DC | PRN
  Administered 2014-07-31: 10 mg via INTRAVENOUS

## 2014-07-31 MED ORDER — EPHEDRINE SULFATE 50 MG/ML IJ SOLN
INTRAMUSCULAR | Status: DC | PRN
  Administered 2014-07-31 (×2): 10 mg via INTRAVENOUS
  Administered 2014-07-31: 15 mg via INTRAVENOUS

## 2014-07-31 MED ORDER — LACTATED RINGERS IV SOLN
INTRAVENOUS | Status: DC
  Administered 2014-07-31 (×2): via INTRAVENOUS

## 2014-07-31 MED ORDER — CEFAZOLIN SODIUM-DEXTROSE 2-3 GM-% IV SOLR
INTRAVENOUS | Status: AC
  Filled 2014-07-31: qty 50

## 2014-07-31 MED ORDER — OXYCODONE HCL 5 MG PO TABS: 5.0000 mg | ORAL_TABLET | Freq: Once | ORAL | Status: DC | PRN

## 2014-07-31 MED ORDER — PROPOFOL 10 MG/ML IV BOLUS
INTRAVENOUS | Status: DC | PRN
  Administered 2014-07-31: 150 mg via INTRAVENOUS

## 2014-07-31 MED ORDER — SODIUM CHLORIDE 0.9 % IV SOLN
250.0000 mL | INTRAVENOUS | Status: DC | PRN
Start: 2014-07-31 — End: 2014-07-31

## 2014-07-31 MED ORDER — HEPARIN SOD (PORK) LOCK FLUSH 100 UNIT/ML IV SOLN
INTRAVENOUS | Status: DC | PRN
  Administered 2014-07-31: 500 [IU] via INTRAVENOUS

## 2014-07-31 MED ORDER — FENTANYL CITRATE 0.05 MG/ML IJ SOLN: 50.0000 ug | INTRAMUSCULAR | Status: DC | PRN

## 2014-07-31 MED ORDER — ACETAMINOPHEN 325 MG PO TABS: 650.0000 mg | ORAL_TABLET | ORAL | Status: DC | PRN

## 2014-07-31 MED ORDER — ONDANSETRON HCL 4 MG/2ML IJ SOLN
INTRAMUSCULAR | Status: DC | PRN
  Administered 2014-07-31: 4 mg via INTRAVENOUS

## 2014-07-31 MED ORDER — FENTANYL CITRATE 0.05 MG/ML IJ SOLN
INTRAMUSCULAR | Status: DC | PRN
  Administered 2014-07-31: 100 ug via INTRAVENOUS

## 2014-07-31 MED ORDER — SODIUM CHLORIDE 0.9 % IJ SOLN: 3.0000 mL | INTRAMUSCULAR | Status: DC | PRN

## 2014-07-31 MED ORDER — ACETAMINOPHEN 650 MG RE SUPP: 650.0000 mg | RECTAL | Status: DC | PRN

## 2014-07-31 MED ORDER — OXYCODONE HCL 5 MG/5ML PO SOLN: 5.0000 mg | Freq: Once | ORAL | Status: DC | PRN

## 2014-07-31 MED ORDER — MIDAZOLAM HCL 5 MG/5ML IJ SOLN
INTRAMUSCULAR | Status: DC | PRN
  Administered 2014-07-31: 2 mg via INTRAVENOUS

## 2014-07-31 MED ORDER — MIDAZOLAM HCL 2 MG/2ML IJ SOLN
INTRAMUSCULAR | Status: AC
  Filled 2014-07-31: qty 2

## 2014-07-31 MED ORDER — BUPIVACAINE-EPINEPHRINE 0.5% -1:200000 IJ SOLN
INTRAMUSCULAR | Status: DC | PRN
  Administered 2014-07-31: 6 mL

## 2014-07-31 MED ORDER — BUPIVACAINE-EPINEPHRINE (PF) 0.5% -1:200000 IJ SOLN
INTRAMUSCULAR | Status: AC
  Filled 2014-07-31: qty 30

## 2014-07-31 MED ORDER — LIDOCAINE HCL (CARDIAC) 20 MG/ML IV SOLN
INTRAVENOUS | Status: DC | PRN
  Administered 2014-07-31: 100 mg via INTRAVENOUS

## 2014-07-31 MED ORDER — SODIUM CHLORIDE 0.9 % IJ SOLN: 3.0000 mL | Freq: Two times a day (BID) | INTRAMUSCULAR | Status: DC

## 2014-07-31 MED ORDER — PHENYLEPHRINE HCL 10 MG/ML IJ SOLN
INTRAMUSCULAR | Status: DC | PRN
  Administered 2014-07-31: 80 ug via INTRAVENOUS

## 2014-07-31 MED ORDER — OXYCODONE HCL 5 MG PO TABS: 5.0000 mg | ORAL_TABLET | ORAL | Status: DC | PRN

## 2014-07-31 MED ORDER — FENTANYL CITRATE 0.05 MG/ML IJ SOLN
INTRAMUSCULAR | Status: AC
  Filled 2014-07-31: qty 4

## 2014-07-31 MED ORDER — ONDANSETRON HCL 4 MG/2ML IJ SOLN: 4.0000 mg | Freq: Once | INTRAMUSCULAR | Status: DC | PRN

## 2014-07-31 MED ORDER — MEPERIDINE HCL 25 MG/ML IJ SOLN: 6.2500 mg | INTRAMUSCULAR | Status: DC | PRN

## 2014-07-31 MED ORDER — CEFAZOLIN SODIUM-DEXTROSE 2-3 GM-% IV SOLR
INTRAVENOUS | Status: DC | PRN
  Administered 2014-07-31: 2 g via INTRAVENOUS

## 2014-07-31 MED ORDER — HEPARIN (PORCINE) IN NACL 2-0.9 UNIT/ML-% IJ SOLN
INTRAMUSCULAR | Status: DC | PRN
  Administered 2014-07-31: 500 mL via INTRAVENOUS

## 2014-07-31 MED ORDER — HEPARIN (PORCINE) IN NACL 2-0.9 UNIT/ML-% IJ SOLN
INTRAMUSCULAR | Status: AC
  Filled 2014-07-31: qty 500

## 2014-07-31 MED ORDER — HYDROMORPHONE HCL PF 1 MG/ML IJ SOLN
INTRAMUSCULAR | Status: AC
  Filled 2014-07-31: qty 1

## 2014-07-31 MED ORDER — HEPARIN SOD (PORK) LOCK FLUSH 100 UNIT/ML IV SOLN
INTRAVENOUS | Status: AC
  Filled 2014-07-31: qty 5

## 2014-07-31 MED ORDER — HYDROMORPHONE HCL PF 1 MG/ML IJ SOLN
0.2500 mg | INTRAMUSCULAR | Status: DC | PRN
Start: 2014-07-31 — End: 2014-07-31
  Administered 2014-07-31: 0.5 mg via INTRAVENOUS

## 2014-07-31 SURGICAL SUPPLY — 42 items
BAG DECANTER FOR FLEXI CONT (MISCELLANEOUS) ×3 IMPLANT
BLADE HEX COATED 2.75 (ELECTRODE) ×3 IMPLANT
BLADE SURG 11 STRL SS (BLADE) ×3 IMPLANT
BLADE SURG 15 STRL LF DISP TIS (BLADE) ×1 IMPLANT
BLADE SURG 15 STRL SS (BLADE) ×2
CHLORAPREP W/TINT 26ML (MISCELLANEOUS) ×3 IMPLANT
COVER MAYO STAND STRL (DRAPES) ×3 IMPLANT
COVER TABLE BACK 60X90 (DRAPES) ×3 IMPLANT
DECANTER SPIKE VIAL GLASS SM (MISCELLANEOUS) IMPLANT
DERMABOND ADVANCED (GAUZE/BANDAGES/DRESSINGS) ×2
DERMABOND ADVANCED .7 DNX12 (GAUZE/BANDAGES/DRESSINGS) ×1 IMPLANT
DRAPE C-ARM 42X72 X-RAY (DRAPES) ×3 IMPLANT
DRAPE LAPAROTOMY TRNSV 102X78 (DRAPE) ×3 IMPLANT
DRAPE UTILITY XL STRL (DRAPES) ×3 IMPLANT
DRSG TEGADERM 4X4.75 (GAUZE/BANDAGES/DRESSINGS) ×6 IMPLANT
ELECT REM PT RETURN 9FT ADLT (ELECTROSURGICAL) ×3
ELECTRODE REM PT RTRN 9FT ADLT (ELECTROSURGICAL) ×1 IMPLANT
GLOVE BIO SURGEON STRL SZ 6 (GLOVE) ×3 IMPLANT
GLOVE BIOGEL PI IND STRL 6.5 (GLOVE) ×1 IMPLANT
GLOVE BIOGEL PI IND STRL 7.0 (GLOVE) ×1 IMPLANT
GLOVE BIOGEL PI INDICATOR 6.5 (GLOVE) ×2
GLOVE BIOGEL PI INDICATOR 7.0 (GLOVE) ×2
GLOVE ECLIPSE 6.5 STRL STRAW (GLOVE) ×3 IMPLANT
GOWN STRL REUS W/ TWL LRG LVL3 (GOWN DISPOSABLE) ×1 IMPLANT
GOWN STRL REUS W/TWL 2XL LVL3 (GOWN DISPOSABLE) ×3 IMPLANT
GOWN STRL REUS W/TWL LRG LVL3 (GOWN DISPOSABLE) ×2
KIT PORT POWER 8FR ISP CVUE (Catheter) ×3 IMPLANT
NEEDLE HYPO 25X1 1.5 SAFETY (NEEDLE) ×3 IMPLANT
PACK BASIN DAY SURGERY FS (CUSTOM PROCEDURE TRAY) ×3 IMPLANT
PENCIL BUTTON HOLSTER BLD 10FT (ELECTRODE) ×3 IMPLANT
SLEEVE SCD COMPRESS KNEE MED (MISCELLANEOUS) ×3 IMPLANT
SPONGE GAUZE 4X4 12PLY STER LF (GAUZE/BANDAGES/DRESSINGS) ×6 IMPLANT
SUT MNCRL AB 4-0 PS2 18 (SUTURE) ×3 IMPLANT
SUT PROLENE 2 0 SH DA (SUTURE) ×6 IMPLANT
SUT VIC AB 3-0 SH 27 (SUTURE) ×2
SUT VIC AB 3-0 SH 27X BRD (SUTURE) ×1 IMPLANT
SUT VICRYL 3-0 CR8 SH (SUTURE) IMPLANT
SYR 5ML LUER SLIP (SYRINGE) ×3 IMPLANT
SYR CONTROL 10ML LL (SYRINGE) ×3 IMPLANT
SYRINGE 10CC LL (SYRINGE) ×3 IMPLANT
TOWEL OR 17X24 6PK STRL BLUE (TOWEL DISPOSABLE) ×3 IMPLANT
TOWEL OR NON WOVEN STRL DISP B (DISPOSABLE) IMPLANT

## 2014-07-31 NOTE — Op Note (Signed)
PREOPERATIVE DIAGNOSIS:  Colon cancer     POSTOPERATIVE DIAGNOSIS:  Same     PROCEDURE: left subclavian port placement, Bard ClearVue  Power Port, MRI safe, 8-French.      SURGEON:  Stark Klein, MD      ANESTHESIA:  General   FINDINGS:  Good venous return, easy flush, and tip of the catheter and   SVC 24 cm.      SPECIMEN:  None.      ESTIMATED BLOOD LOSS:  Minimal.      COMPLICATIONS:  None known.      PROCEDURE:  Pt was identified in the holding area and taken to   the operating room, where patient was placed supine on the operating room   table.  General LMA anesthesia was induced.  Patient's arms were tucked and the upper   chest and neck were prepped and draped in sterile fashion.  Time-out was   performed according to the surgical safety check list.  When all was   correct, we continued.   Local anesthetic was administered over this   area at the angle of the clavicle.  The vein was accessed with 1 pass of the needle. There was good venous return and the wire passed easily with no ectopy.   Fluoroscopy was used to confirm that the wire was in the vena cava.      The patient was placed back level and the area for the pocket was anethetized   with local anesthetic.  A 3-cm transverse incision was made with a #15   blade.  Cautery was used to divide the subcutaneous tissues down to the   pectoralis muscle.  An [employer] retractor was used to elevate the skin   while a pocket was created on top of the pectoralis fascia.  The port   was placed into the pocket to confirm that it was of adequate size.  The   catheter was preattached to the port.  The port was then secured to the   pectoralis fascia with four 2-0 Prolene sutures.  These were clamped and   not tied down yet.    The catheter was tunneled through to the wire exit   site.  The catheter was placed along the wire to determine what length it should be to be in the SVC.  The catheter was cut at 24 cm.  The tunneler  sheath and dilator were passed over the wire and the dilator and wire were removed.  The catheter was advanced through the tunneler sheath and the tunneler sheath was pulled away.  Care was taken to keep the catheter in the tunneler sheath as this occurred. This was advanced and the tunneler sheath was removed.  There was good venous   return and easy flush of the catheter.  The Prolene sutures were tied   down to the pectoral fascia.  The skin was reapproximated using 3-0   Vicryl interrupted deep dermal sutures.    Fluoroscopy was used to re-confirm good position of the catheter.  The skin   was then closed using 4-0 Monocryl in a subcuticular fashion.  The port was flushed with concentrated heparin flush as well.  The wounds were then cleaned, dried, and dressed with Dermabond.  The patient was awakened from anesthesia and taken to the PACU in stable condition.  Needle, sponge, and instrument counts were correct.               Stark Klein, MD

## 2014-07-31 NOTE — Anesthesia Postprocedure Evaluation (Signed)
Anesthesia Post Note  Patient: Matthew [name]  Procedure(s) Performed: Procedure(s) (LRB): INSERTION PORT-A-CATH (Left)  Anesthesia type: general  Patient location: PACU  Post pain: Pain level controlled  Post assessment: Patient's Cardiovascular Status Stable  Last Vitals:  Filed Vitals:   07/31/14 0952  BP: 126/73  Pulse: 84  Temp: 36.5 C  Resp: 16    Post vital signs: Reviewed and stable  Level of consciousness: sedated  Complications: No apparent anesthesia complications

## 2014-07-31 NOTE — Transfer of Care (Signed)
Immediate Anesthesia Transfer of Care Note  Patient: Matthew [name]  Procedure(s) Performed: Procedure(s): INSERTION PORT-A-CATH (Left)  Patient Location: PACU  Anesthesia Type:General  Level of Consciousness: awake  Airway & Oxygen Therapy: Patient Spontanous Breathing and Patient connected to face mask oxygen  Post-op Assessment: Report given to PACU RN and Post -op Vital signs reviewed and stable  Post vital signs: Reviewed and stable  Complications: No apparent anesthesia complications

## 2014-07-31 NOTE — H&P (View-Only) (Signed)
Matthew [name] is a 68 y.o. male who is status post a Lap assisted R hemicolectomy on 06/30/14.  He is doing well.  He is eating well and having BM's.  His pain is minimal.  Objective: Filed Vitals:   07/19/14 1006  BP: 96/60  Pulse: 64  Temp: 97.6 F (36.4 C)  Resp: 16    General appearance: alert and cooperative GI: normal findings: soft, non-tender  Incision: healing well   Assessment: s/p  Patient Active Problem List   Diagnosis Date Noted  . Small bowel obstruction 06/29/2014  . Colon cancer 06/06/2014    Plan: Doing well after surgery.  He is scheduled to see Dr Matthew [name] next week.  Advance diet as tolerated.  No heavy lifting for 8 wks after surgery.  RTO in 3 months.  CT showed a possible complex cyst on his R kidney.  Will defer any work up to Dr Matthew [name].     Matthew [name], [facility], [place]   07/19/2014 10:18 AM

## 2014-07-31 NOTE — Anesthesia Preprocedure Evaluation (Signed)
Anesthesia Evaluation  Patient identified by MRN, date of birth, ID band Patient awake    Reviewed: Allergy & Precautions, H&P , NPO status , Patient's Chart, lab work & pertinent test results  Airway Mallampati: I TM Distance: >3 FB Neck ROM: Full    Dental   Pulmonary COPDformer smoker,          Cardiovascular     Neuro/Psych Anxiety Bipolar Disorder    GI/Hepatic GERD-  Medicated and Controlled,  Endo/Other    Renal/GU      Musculoskeletal   Abdominal   Peds  Hematology   Anesthesia Other Findings   Reproductive/Obstetrics                           Anesthesia Physical Anesthesia Plan  ASA: III  Anesthesia Plan: General   Post-op Pain Management:    Induction: Intravenous  Airway Management Planned: LMA  Additional Equipment:   Intra-op Plan:   Post-operative Plan: Extubation in OR  Informed Consent: I have reviewed the patients History and Physical, chart, labs and discussed the procedure including the risks, benefits and alternatives for the proposed anesthesia with the patient or authorized representative who has indicated his/her understanding and acceptance.     Plan Discussed with: CRNA and Surgeon  Anesthesia Plan Comments:         Anesthesia Quick Evaluation

## 2014-07-31 NOTE — Interval H&P Note (Signed)
History and Physical Interval Note:  07/31/2014 7:27 AM  Matthew [name]  has presented today for surgery, with the diagnosis of colon cancer  The various methods of treatment have been discussed with the patient and family. After consideration of risks, benefits and other options for treatment, the patient has consented to  Procedure(s): INSERTION PORT-A-CATH (N/A) as a surgical intervention .  The patient's history has been reviewed, patient examined, no change in status, stable for surgery.  I have reviewed the patient's chart and labs.  Questions were answered to the patient's satisfaction.     [name]

## 2014-07-31 NOTE — Anesthesia Procedure Notes (Signed)
Procedure Name: LMA Insertion Date/Time: 07/31/2014 7:43 AM Performed by: Lieutenant Diego Pre-anesthesia Checklist: Patient identified, Emergency Drugs available, Suction available and Patient being monitored Patient Re-evaluated:Patient Re-evaluated prior to inductionOxygen Delivery Method: Circle System Utilized Preoxygenation: Pre-oxygenation with 100% oxygen Intubation Type: IV induction Ventilation: Mask ventilation without difficulty LMA: LMA inserted LMA Size: 5.0 Number of attempts: 1 Airway Equipment and Method: bite block Placement Confirmation: positive ETCO2 and breath sounds checked- equal and bilateral Tube secured with: Tape Dental Injury: Teeth and Oropharynx as per pre-operative assessment

## 2014-07-31 NOTE — Discharge Instructions (Addendum)
Implanted Port Insertion, Care After Refer to this sheet in the next few weeks. These instructions provide you with information on caring for yourself after your procedure. Your health care provider may also give you more specific instructions. Your treatment has been planned according to current medical practices, but problems sometimes occur. Call your health care provider if you have any problems or questions after your procedure. WHAT TO EXPECT AFTER THE PROCEDURE After your procedure, it is typical to have the following:   Discomfort at the port insertion site. Ice packs to the area will help.  Bruising on the skin over the port. This will subside in 3-4 days. HOME CARE INSTRUCTIONS  After your port is placed, you will get a manufacturer's information card. The card has information about your port. Keep this card with you at all times.   Know what kind of port you have. There are many types of ports available.   Wear a medical alert bracelet in case of an emergency. This can help alert health care workers that you have a port.   The port can stay in for as long as your health care provider believes it is necessary.   A home health care nurse may give medicines and take care of the port.   You or a family member can get special training and directions for giving medicine and taking care of the port at home.  SEEK MEDICAL CARE IF:   Your port does not flush or you are unable to get a blood return.   You have a fever or chills. SEEK IMMEDIATE MEDICAL CARE IF:  You have new fluid or pus coming from your incision.   You notice a bad smell coming from your incision site.   You have swelling, pain, or more redness at the incision or port site.   You have chest pain or shortness of breath.  Do not shower until the port has been de-accessed (tubing pulled out).  No strenuous activity for 1 week.    Post Anesthesia Home Care Instructions  Activity: Get plenty of rest  for the remainder of the day. A responsible adult should stay with you for 24 hours following the procedure.  For the next 24 hours, DO NOT: -Drive a car -Paediatric nurse -Drink alcoholic beverages -Take any medication unless instructed by your physician -Make any legal decisions or sign important papers.  Meals: Start with liquid foods such as gelatin or soup. Progress to regular foods as tolerated. Avoid greasy, spicy, heavy foods. If nausea and/or vomiting occur, drink only clear liquids until the nausea and/or vomiting subsides. Call your physician if vomiting continues.  Special Instructions/Symptoms: Your throat may feel dry or sore from the anesthesia or the breathing tube placed in your throat during surgery. If this causes discomfort, gargle with warm salt water. The discomfort should disappear within 24 hours.

## 2014-08-01 ENCOUNTER — Encounter (HOSPITAL_BASED_OUTPATIENT_CLINIC_OR_DEPARTMENT_OTHER): Payer: Self-pay | Admitting: General Surgery

## 2014-08-02 ENCOUNTER — Other Ambulatory Visit (HOSPITAL_BASED_OUTPATIENT_CLINIC_OR_DEPARTMENT_OTHER): Payer: Medicare Other

## 2014-08-02 ENCOUNTER — Ambulatory Visit (HOSPITAL_BASED_OUTPATIENT_CLINIC_OR_DEPARTMENT_OTHER): Payer: Medicare Other

## 2014-08-02 VITALS — BP 129/78 | HR 88 | Temp 97.0°F

## 2014-08-02 DIAGNOSIS — C180 Malignant neoplasm of cecum: Secondary | ICD-10-CM | POA: Diagnosis not present

## 2014-08-02 DIAGNOSIS — C189 Malignant neoplasm of colon, unspecified: Secondary | ICD-10-CM | POA: Diagnosis not present

## 2014-08-02 DIAGNOSIS — Z5111 Encounter for antineoplastic chemotherapy: Secondary | ICD-10-CM | POA: Diagnosis not present

## 2014-08-02 LAB — CBC WITH DIFFERENTIAL/PLATELET
BASO%: 0.4 % (ref 0.0–2.0)
BASOS ABS: 0 10*3/uL (ref 0.0–0.1)
EOS ABS: 0.1 10*3/uL (ref 0.0–0.5)
EOS%: 1.8 % (ref 0.0–7.0)
HCT: 34.3 % — ABNORMAL LOW (ref 38.4–49.9)
HGB: 11.3 g/dL — ABNORMAL LOW (ref 13.0–17.1)
LYMPH%: 29.4 % (ref 14.0–49.0)
MCH: 32.1 pg (ref 27.2–33.4)
MCHC: 32.9 g/dL (ref 32.0–36.0)
MCV: 97.5 fL (ref 79.3–98.0)
MONO#: 0.5 10*3/uL (ref 0.1–0.9)
MONO%: 7.3 % (ref 0.0–14.0)
NEUT%: 61.1 % (ref 39.0–75.0)
NEUTROS ABS: 4.2 10*3/uL (ref 1.5–6.5)
PLATELETS: 162 10*3/uL (ref 140–400)
RBC: 3.52 10*6/uL — ABNORMAL LOW (ref 4.20–5.82)
RDW: 14.9 % — ABNORMAL HIGH (ref 11.0–14.6)
WBC: 6.8 10*3/uL (ref 4.0–10.3)
lymph#: 2 10*3/uL (ref 0.9–3.3)

## 2014-08-02 MED ORDER — LEUCOVORIN CALCIUM INJECTION 350 MG
400.0000 mg/m2 | Freq: Once | INTRAVENOUS | Status: AC
  Administered 2014-08-02: 800 mg via INTRAVENOUS
  Filled 2014-08-02: qty 40

## 2014-08-02 MED ORDER — OXALIPLATIN CHEMO INJECTION 100 MG/20ML
85.0000 mg/m2 | Freq: Once | INTRAVENOUS | Status: AC
  Administered 2014-08-02: 170 mg via INTRAVENOUS
  Filled 2014-08-02: qty 34

## 2014-08-02 MED ORDER — DEXAMETHASONE SODIUM PHOSPHATE 10 MG/ML IJ SOLN
10.0000 mg | Freq: Once | INTRAMUSCULAR | Status: AC
  Administered 2014-08-02: 10 mg via INTRAVENOUS

## 2014-08-02 MED ORDER — ONDANSETRON 8 MG/NS 50 ML IVPB
INTRAVENOUS | Status: AC
  Filled 2014-08-02: qty 8

## 2014-08-02 MED ORDER — DEXAMETHASONE SODIUM PHOSPHATE 10 MG/ML IJ SOLN
INTRAMUSCULAR | Status: AC
  Filled 2014-08-02: qty 1

## 2014-08-02 MED ORDER — FLUOROURACIL CHEMO INJECTION 2.5 GM/50ML
400.0000 mg/m2 | Freq: Once | INTRAVENOUS | Status: AC
Start: 2014-08-02 — End: 2014-08-02
  Administered 2014-08-02: 800 mg via INTRAVENOUS
  Filled 2014-08-02: qty 16

## 2014-08-02 MED ORDER — ONDANSETRON 8 MG/50ML IVPB (CHCC)
8.0000 mg | Freq: Once | INTRAVENOUS | Status: AC
  Administered 2014-08-02: 8 mg via INTRAVENOUS

## 2014-08-02 MED ORDER — SODIUM CHLORIDE 0.9 % IV SOLN
2400.0000 mg/m2 | INTRAVENOUS | Status: DC
  Administered 2014-08-02: 4750 mg via INTRAVENOUS
  Filled 2014-08-02: qty 95

## 2014-08-02 MED ORDER — DEXTROSE 5 % IV SOLN
Freq: Once | INTRAVENOUS | Status: AC
  Administered 2014-08-02: 14:00:00 via INTRAVENOUS

## 2014-08-02 NOTE — Patient Instructions (Signed)
[facility] Discharge Instructions for Patients Receiving Chemotherapy  Today you received the following chemotherapy agents: Oxaliplatin, Leucovorin, 5FU   To help prevent nausea and vomiting after your treatment, we encourage you to take your nausea medication as prescribed.    If you develop nausea and vomiting that is not controlled by your nausea medication, call the clinic.   BELOW ARE SYMPTOMS THAT SHOULD BE REPORTED IMMEDIATELY:  *FEVER GREATER THAN 100.5 F  *CHILLS WITH OR WITHOUT FEVER  NAUSEA AND VOMITING THAT IS NOT CONTROLLED WITH YOUR NAUSEA MEDICATION  *UNUSUAL SHORTNESS OF BREATH  *UNUSUAL BRUISING OR BLEEDING  TENDERNESS IN MOUTH AND THROAT WITH OR WITHOUT PRESENCE OF ULCERS  *URINARY PROBLEMS  *BOWEL PROBLEMS  UNUSUAL RASH Items with * indicate a potential emergency and should be followed up as soon as possible.  Feel free to call the clinic you have any questions or concerns. The clinic phone number is [phone].    

## 2014-08-02 NOTE — Progress Notes (Signed)
Ok to use CMET from 06/29/14 for today chemotherapy per pharmacy since patient has never had chemo before.   Patient completed first cycle of FOLFOX without any problems or complications. Patient discharged home.

## 2014-08-02 NOTE — Progress Notes (Signed)
OK per Dr. Benay Spice to treat with CMET from 06/29/14.

## 2014-08-03 ENCOUNTER — Telehealth: Payer: Self-pay | Admitting: *Deleted

## 2014-08-03 ENCOUNTER — Ambulatory Visit: Payer: Medicare Other

## 2014-08-03 ENCOUNTER — Ambulatory Visit: Payer: Medicare Other | Admitting: Oncology

## 2014-08-03 LAB — CEA: CEA: 13.6 ng/mL — ABNORMAL HIGH (ref 0.0–5.0)

## 2014-08-03 NOTE — Telephone Encounter (Signed)
Called pt at home and left message requesting a call back for post chemo follow up.

## 2014-08-04 ENCOUNTER — Ambulatory Visit (HOSPITAL_BASED_OUTPATIENT_CLINIC_OR_DEPARTMENT_OTHER): Payer: Medicare Other

## 2014-08-04 VITALS — BP 116/67 | HR 98 | Temp 98.1°F

## 2014-08-04 DIAGNOSIS — C180 Malignant neoplasm of cecum: Secondary | ICD-10-CM

## 2014-08-04 DIAGNOSIS — C189 Malignant neoplasm of colon, unspecified: Secondary | ICD-10-CM

## 2014-08-04 MED ORDER — SODIUM CHLORIDE 0.9 % IJ SOLN
10.0000 mL | INTRAMUSCULAR | Status: DC | PRN
  Administered 2014-08-04: 10 mL
  Filled 2014-08-04: qty 10

## 2014-08-04 MED ORDER — HEPARIN SOD (PORK) LOCK FLUSH 100 UNIT/ML IV SOLN
500.0000 [IU] | Freq: Once | INTRAVENOUS | Status: AC | PRN
  Administered 2014-08-04: 500 [IU]
  Filled 2014-08-04: qty 5

## 2014-08-13 ENCOUNTER — Other Ambulatory Visit: Payer: Self-pay | Admitting: Oncology

## 2014-08-16 ENCOUNTER — Telehealth: Payer: Self-pay | Admitting: Oncology

## 2014-08-16 ENCOUNTER — Ambulatory Visit (HOSPITAL_BASED_OUTPATIENT_CLINIC_OR_DEPARTMENT_OTHER): Payer: Medicare Other | Admitting: Nurse Practitioner

## 2014-08-16 ENCOUNTER — Other Ambulatory Visit (HOSPITAL_BASED_OUTPATIENT_CLINIC_OR_DEPARTMENT_OTHER): Payer: Medicare Other

## 2014-08-16 ENCOUNTER — Ambulatory Visit (HOSPITAL_BASED_OUTPATIENT_CLINIC_OR_DEPARTMENT_OTHER): Payer: Medicare Other

## 2014-08-16 VITALS — BP 118/67 | HR 110 | Temp 97.7°F | Resp 18 | Ht 69.0 in | Wt 181.4 lb

## 2014-08-16 DIAGNOSIS — Z23 Encounter for immunization: Secondary | ICD-10-CM | POA: Diagnosis not present

## 2014-08-16 DIAGNOSIS — Z5111 Encounter for antineoplastic chemotherapy: Secondary | ICD-10-CM | POA: Diagnosis not present

## 2014-08-16 DIAGNOSIS — C180 Malignant neoplasm of cecum: Secondary | ICD-10-CM

## 2014-08-16 DIAGNOSIS — C189 Malignant neoplasm of colon, unspecified: Secondary | ICD-10-CM

## 2014-08-16 LAB — CBC WITH DIFFERENTIAL/PLATELET
BASO%: 0.5 % (ref 0.0–2.0)
Basophils Absolute: 0 10*3/uL (ref 0.0–0.1)
EOS%: 3.3 % (ref 0.0–7.0)
Eosinophils Absolute: 0.2 10*3/uL (ref 0.0–0.5)
HCT: 34.4 % — ABNORMAL LOW (ref 38.4–49.9)
HGB: 11.6 g/dL — ABNORMAL LOW (ref 13.0–17.1)
LYMPH%: 22.9 % (ref 14.0–49.0)
MCH: 32.5 pg (ref 27.2–33.4)
MCHC: 33.6 g/dL (ref 32.0–36.0)
MCV: 96.6 fL (ref 79.3–98.0)
MONO#: 0.5 10*3/uL (ref 0.1–0.9)
MONO%: 8.4 % (ref 0.0–14.0)
NEUT#: 3.6 10*3/uL (ref 1.5–6.5)
NEUT%: 64.9 % (ref 39.0–75.0)
PLATELETS: 160 10*3/uL (ref 140–400)
RBC: 3.56 10*6/uL — ABNORMAL LOW (ref 4.20–5.82)
RDW: 14.2 % (ref 11.0–14.6)
WBC: 5.5 10*3/uL (ref 4.0–10.3)
lymph#: 1.3 10*3/uL (ref 0.9–3.3)

## 2014-08-16 LAB — COMPREHENSIVE METABOLIC PANEL (CC13)
ALK PHOS: 102 U/L (ref 40–150)
ALT: 28 U/L (ref 0–55)
AST: 24 U/L (ref 5–34)
Albumin: 3.4 g/dL — ABNORMAL LOW (ref 3.5–5.0)
Anion Gap: 8 mEq/L (ref 3–11)
BILIRUBIN TOTAL: 0.27 mg/dL (ref 0.20–1.20)
BUN: 11.9 mg/dL (ref 7.0–26.0)
CO2: 25 mEq/L (ref 22–29)
Calcium: 9 mg/dL (ref 8.4–10.4)
Chloride: 105 mEq/L (ref 98–109)
Creatinine: 1.1 mg/dL (ref 0.7–1.3)
GLUCOSE: 139 mg/dL (ref 70–140)
Potassium: 4.6 mEq/L (ref 3.5–5.1)
SODIUM: 138 meq/L (ref 136–145)
Total Protein: 6.6 g/dL (ref 6.4–8.3)

## 2014-08-16 MED ORDER — SODIUM CHLORIDE 0.9 % IV SOLN
2400.0000 mg/m2 | INTRAVENOUS | Status: AC
  Administered 2014-08-16: 4750 mg via INTRAVENOUS
  Filled 2014-08-16: qty 95

## 2014-08-16 MED ORDER — LEUCOVORIN CALCIUM INJECTION 350 MG
400.0000 mg/m2 | Freq: Once | INTRAVENOUS | Status: AC
  Administered 2014-08-16: 800 mg via INTRAVENOUS
  Filled 2014-08-16: qty 40

## 2014-08-16 MED ORDER — INFLUENZA VAC SPLIT QUAD 0.5 ML IM SUSY
0.5000 mL | PREFILLED_SYRINGE | Freq: Once | INTRAMUSCULAR | Status: AC
  Administered 2014-08-16: 0.5 mL via INTRAMUSCULAR
  Filled 2014-08-16: qty 0.5

## 2014-08-16 MED ORDER — DEXTROSE 5 % IV SOLN
Freq: Once | INTRAVENOUS | Status: AC
  Administered 2014-08-16: 12:00:00 via INTRAVENOUS

## 2014-08-16 MED ORDER — ONDANSETRON 8 MG/50ML IVPB (CHCC)
8.0000 mg | Freq: Once | INTRAVENOUS | Status: AC
  Administered 2014-08-16: 8 mg via INTRAVENOUS

## 2014-08-16 MED ORDER — ONDANSETRON 8 MG/NS 50 ML IVPB
INTRAVENOUS | Status: AC
  Filled 2014-08-16: qty 8

## 2014-08-16 MED ORDER — DEXAMETHASONE SODIUM PHOSPHATE 10 MG/ML IJ SOLN
10.0000 mg | Freq: Once | INTRAMUSCULAR | Status: AC
  Administered 2014-08-16: 10 mg via INTRAVENOUS

## 2014-08-16 MED ORDER — OXALIPLATIN CHEMO INJECTION 100 MG/20ML
85.0000 mg/m2 | Freq: Once | INTRAVENOUS | Status: AC
  Administered 2014-08-16: 170 mg via INTRAVENOUS
  Filled 2014-08-16: qty 34

## 2014-08-16 MED ORDER — DEXAMETHASONE SODIUM PHOSPHATE 10 MG/ML IJ SOLN
INTRAMUSCULAR | Status: AC
  Filled 2014-08-16: qty 1

## 2014-08-16 MED ORDER — FLUOROURACIL CHEMO INJECTION 2.5 GM/50ML
400.0000 mg/m2 | Freq: Once | INTRAVENOUS | Status: AC
  Administered 2014-08-16: 800 mg via INTRAVENOUS
  Filled 2014-08-16: qty 16

## 2014-08-16 NOTE — Progress Notes (Signed)
  [place] OFFICE PROGRESS NOTE   Diagnosis: Colon cancer.   INTERVAL HISTORY:   He returns as scheduled. He completed cycle 1 FOLFOX 08/02/2014. He had mild nausea. No vomiting. No mouth sores. No diarrhea. He denies hand or foot pain or redness. Cold sensitivity lasted for 4-5 days. No persistent neuropathy symptoms. No shortness of breath. No chest pain. He denies leg swelling and calf pain. He has stable chronic back pain. Over the past 2-3 weeks he has noted pain/tenderness at the upper aspect of the abdominal scar.  Objective:  Vital signs in last 24 hours:  Blood pressure 118/67, pulse 110, temperature 97.7 F (36.5 C), temperature source Oral, resp. rate 18, height $RemoveBe'5\' 9"'aSSyDfBrj$  (1.753 m), weight 181 lb 6.4 oz (82.283 kg), SpO2 96.00%.  Repeat heart rate 104.  HEENT: No thrush or ulcers. Resp: Lungs clear bilaterally. Cardio: Distant heart sounds. Regular rate and rhythm. GI: Abdomen soft. No hepatomegaly. Mild tenderness to the left of the upper aspect of the midline scar; question retained suture. No erythema. Vascular: No leg edema.  Port-A-Cath site without erythema.  Lab Results:  Lab Results  Component Value Date   WBC 5.5 08/16/2014   HGB 11.6* 08/16/2014   HCT 34.4* 08/16/2014   MCV 96.6 08/16/2014   PLT 160 08/16/2014   NEUTROABS 3.6 08/16/2014    Imaging:  No results found.  Medications: I have reviewed the patient's current medications.  Assessment/Plan: 1. 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  Markedly elevated preoperative CEA Cycle 1 FOLFOX 08/02/2014. 2. history of tobacco use/CT evidence of COPD  3. history of peptic ulcer disease  4. history of colon polyps, multiple polyps in the 06/30/2014 resection specimen  5. chronic back  pain/arthritis    Disposition: Matthew [name] appears stable. He has completed 1 cycle of FOLFOX which he tolerated well. Plan to proceed with cycle 2 today as scheduled.  He lives much closer to the [facility] and would like to transfer his care there. We made a referral. We will continue to see him here until he is established with the oncologist at [facility].  He is having pain at the upper aspect of the midline scar. We will contact his surgeons office to schedule an appointment to evaluate.  We scheduled a return visit here and cycle 3 FOLFOX in 2 weeks. That appointment will be canceled pending establishing oncology care in [place].  Plan reviewed with Dr. Benay Spice.    Ned Card ANP/GNP-BC   08/16/2014  10:24 AM

## 2014-08-16 NOTE — Telephone Encounter (Signed)
gv adn printed appt sched and avs for pt for Sept...sed added tx....pt decided that she wants to continue to have Dr. Benay Spice as her MD

## 2014-08-16 NOTE — Patient Instructions (Signed)
[facility] Discharge Instructions for Patients Receiving Chemotherapy  Today you received the following chemotherapy agents;  Oxaliplatin, Leucovorin and 5-FU.   To help prevent nausea and vomiting after your treatment, we encourage you to take your nausea medication as directed.    If you develop nausea and vomiting that is not controlled by your nausea medication, call the clinic.   BELOW ARE SYMPTOMS THAT SHOULD BE REPORTED IMMEDIATELY:  *FEVER GREATER THAN 100.5 F  *CHILLS WITH OR WITHOUT FEVER  NAUSEA AND VOMITING THAT IS NOT CONTROLLED WITH YOUR NAUSEA MEDICATION  *UNUSUAL SHORTNESS OF BREATH  *UNUSUAL BRUISING OR BLEEDING  TENDERNESS IN MOUTH AND THROAT WITH OR WITHOUT PRESENCE OF ULCERS  *URINARY PROBLEMS  *BOWEL PROBLEMS  UNUSUAL RASH Items with * indicate a potential emergency and should be followed up as soon as possible.  Feel free to call the clinic you have any questions or concerns. The clinic phone number is [phone].

## 2014-08-18 ENCOUNTER — Ambulatory Visit (HOSPITAL_BASED_OUTPATIENT_CLINIC_OR_DEPARTMENT_OTHER): Payer: Medicare Other

## 2014-08-18 VITALS — BP 125/72 | HR 100 | Temp 98.0°F

## 2014-08-18 DIAGNOSIS — Z452 Encounter for adjustment and management of vascular access device: Secondary | ICD-10-CM | POA: Diagnosis not present

## 2014-08-18 DIAGNOSIS — C189 Malignant neoplasm of colon, unspecified: Secondary | ICD-10-CM

## 2014-08-18 DIAGNOSIS — C180 Malignant neoplasm of cecum: Secondary | ICD-10-CM | POA: Diagnosis not present

## 2014-08-18 MED ORDER — HEPARIN SOD (PORK) LOCK FLUSH 100 UNIT/ML IV SOLN
500.0000 [IU] | Freq: Once | INTRAVENOUS | Status: AC | PRN
  Administered 2014-08-18: 500 [IU]
  Filled 2014-08-18: qty 5

## 2014-08-18 MED ORDER — SODIUM CHLORIDE 0.9 % IJ SOLN
10.0000 mL | INTRAMUSCULAR | Status: DC | PRN
  Administered 2014-08-18: 10 mL
  Filled 2014-08-18: qty 10

## 2014-08-25 ENCOUNTER — Ambulatory Visit (HOSPITAL_COMMUNITY): Payer: Medicare Other

## 2014-08-27 ENCOUNTER — Other Ambulatory Visit: Payer: Self-pay | Admitting: Oncology

## 2014-08-30 ENCOUNTER — Ambulatory Visit (HOSPITAL_BASED_OUTPATIENT_CLINIC_OR_DEPARTMENT_OTHER): Payer: Medicare Other | Admitting: Nurse Practitioner

## 2014-08-30 ENCOUNTER — Telehealth: Payer: Self-pay | Admitting: Oncology

## 2014-08-30 ENCOUNTER — Other Ambulatory Visit (HOSPITAL_BASED_OUTPATIENT_CLINIC_OR_DEPARTMENT_OTHER): Payer: Medicare Other

## 2014-08-30 ENCOUNTER — Ambulatory Visit (HOSPITAL_BASED_OUTPATIENT_CLINIC_OR_DEPARTMENT_OTHER): Payer: Medicare Other

## 2014-08-30 VITALS — BP 124/83 | HR 93 | Temp 98.3°F | Resp 18 | Ht 69.0 in | Wt 181.8 lb

## 2014-08-30 VITALS — BP 108/66 | HR 90 | Temp 97.8°F | Resp 20

## 2014-08-30 DIAGNOSIS — C180 Malignant neoplasm of cecum: Secondary | ICD-10-CM | POA: Diagnosis not present

## 2014-08-30 DIAGNOSIS — C189 Malignant neoplasm of colon, unspecified: Secondary | ICD-10-CM

## 2014-08-30 DIAGNOSIS — Z5111 Encounter for antineoplastic chemotherapy: Secondary | ICD-10-CM | POA: Diagnosis not present

## 2014-08-30 LAB — COMPREHENSIVE METABOLIC PANEL (CC13)
ALT: 28 U/L (ref 0–55)
AST: 25 U/L (ref 5–34)
Albumin: 3.3 g/dL — ABNORMAL LOW (ref 3.5–5.0)
Alkaline Phosphatase: 99 U/L (ref 40–150)
Anion Gap: 9 mEq/L (ref 3–11)
BILIRUBIN TOTAL: 0.4 mg/dL (ref 0.20–1.20)
BUN: 16 mg/dL (ref 7.0–26.0)
CALCIUM: 8.8 mg/dL (ref 8.4–10.4)
CO2: 24 meq/L (ref 22–29)
CREATININE: 1.3 mg/dL (ref 0.7–1.3)
Chloride: 106 mEq/L (ref 98–109)
Glucose: 121 mg/dl (ref 70–140)
Potassium: 4.4 mEq/L (ref 3.5–5.1)
Sodium: 139 mEq/L (ref 136–145)
Total Protein: 6.4 g/dL (ref 6.4–8.3)

## 2014-08-30 LAB — CBC WITH DIFFERENTIAL/PLATELET
BASO%: 0.9 % (ref 0.0–2.0)
BASOS ABS: 0 10*3/uL (ref 0.0–0.1)
EOS%: 3 % (ref 0.0–7.0)
Eosinophils Absolute: 0.1 10*3/uL (ref 0.0–0.5)
HEMATOCRIT: 33.8 % — AB (ref 38.4–49.9)
HEMOGLOBIN: 11.2 g/dL — AB (ref 13.0–17.1)
LYMPH%: 29.3 % (ref 14.0–49.0)
MCH: 32.3 pg (ref 27.2–33.4)
MCHC: 33.2 g/dL (ref 32.0–36.0)
MCV: 97.2 fL (ref 79.3–98.0)
MONO#: 0.6 10*3/uL (ref 0.1–0.9)
MONO%: 13.7 % (ref 0.0–14.0)
NEUT#: 2.2 10*3/uL (ref 1.5–6.5)
NEUT%: 53.1 % (ref 39.0–75.0)
PLATELETS: 133 10*3/uL — AB (ref 140–400)
RBC: 3.48 10*6/uL — ABNORMAL LOW (ref 4.20–5.82)
RDW: 14.8 % — ABNORMAL HIGH (ref 11.0–14.6)
WBC: 4 10*3/uL (ref 4.0–10.3)
lymph#: 1.2 10*3/uL (ref 0.9–3.3)

## 2014-08-30 MED ORDER — DEXAMETHASONE SODIUM PHOSPHATE 10 MG/ML IJ SOLN
INTRAMUSCULAR | Status: AC
  Filled 2014-08-30: qty 1

## 2014-08-30 MED ORDER — DEXTROSE 5 % IV SOLN
85.0000 mg/m2 | Freq: Once | INTRAVENOUS | Status: AC
  Administered 2014-08-30: 170 mg via INTRAVENOUS
  Filled 2014-08-30: qty 34

## 2014-08-30 MED ORDER — ONDANSETRON 8 MG/NS 50 ML IVPB
INTRAVENOUS | Status: AC
  Filled 2014-08-30: qty 8

## 2014-08-30 MED ORDER — ONDANSETRON 8 MG/50ML IVPB (CHCC)
8.0000 mg | Freq: Once | INTRAVENOUS | Status: AC
  Administered 2014-08-30: 8 mg via INTRAVENOUS

## 2014-08-30 MED ORDER — LEUCOVORIN CALCIUM INJECTION 350 MG
400.0000 mg/m2 | Freq: Once | INTRAVENOUS | Status: AC
  Administered 2014-08-30: 800 mg via INTRAVENOUS
  Filled 2014-08-30: qty 40

## 2014-08-30 MED ORDER — SODIUM CHLORIDE 0.9 % IV SOLN
2400.0000 mg/m2 | INTRAVENOUS | Status: DC
  Administered 2014-08-30: 4750 mg via INTRAVENOUS
  Filled 2014-08-30: qty 95

## 2014-08-30 MED ORDER — DEXAMETHASONE SODIUM PHOSPHATE 10 MG/ML IJ SOLN
10.0000 mg | Freq: Once | INTRAMUSCULAR | Status: AC
  Administered 2014-08-30: 10 mg via INTRAVENOUS

## 2014-08-30 MED ORDER — DEXTROSE 5 % IV SOLN
Freq: Once | INTRAVENOUS | Status: AC
  Administered 2014-08-30: 11:00:00 via INTRAVENOUS

## 2014-08-30 MED ORDER — FLUOROURACIL CHEMO INJECTION 2.5 GM/50ML
400.0000 mg/m2 | Freq: Once | INTRAVENOUS | Status: AC
  Administered 2014-08-30: 800 mg via INTRAVENOUS
  Filled 2014-08-30: qty 16

## 2014-08-30 NOTE — Telephone Encounter (Signed)
gv adn rpitned appt sched and avs for pt for Sept adn OCt...sed added tx.

## 2014-08-30 NOTE — Progress Notes (Signed)
  [facility] OFFICE PROGRESS NOTE   Diagnosis:  Colon cancer  INTERVAL HISTORY:   He has decided to continue oncology care in [place]. He completed cycle 2 FOLFOX on 08/16/2014. He denies nausea/vomiting. No mouth sores. No diarrhea. The cold sensitivity lasted about 5 days. No persistent neuropathy symptoms.  Objective:  Vital signs in last 24 hours:  Blood pressure 124/83, pulse 93, temperature 98.3 F (36.8 C), temperature source Oral, resp. rate 18, height $RemoveBe'5\' 9"'uDqXpJlBm$  (1.753 m), weight 181 lb 12.8 oz (82.464 kg).    HEENT: No thrush or ulcers. Resp: Lungs clear bilaterally. Cardio: Regular rate and rhythm. GI: Abdomen soft and nontender. No hepatomegaly. Vascular: No leg edema. Neuro: Vibratory sense minimally decreased over the fingertips bilaterally per tuning fork exam.     Lab Results:  Lab Results  Component Value Date   WBC 4.0 08/30/2014   HGB 11.2* 08/30/2014   HCT 33.8* 08/30/2014   MCV 97.2 08/30/2014   PLT 133* 08/30/2014   NEUTROABS 2.2 08/30/2014    Imaging:  No results found.  Medications: I have reviewed the patient's current medications.  Assessment/Plan: 1. 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  Markedly elevated preoperative CEA  Cycle 1 FOLFOX 08/02/2014.  Cycle 2 FOLFOX 08/16/2014. 2. history of tobacco use/CT evidence of COPD  3. history of peptic ulcer disease  4. history of colon polyps, multiple polyps in the 06/30/2014 resection specimen  5. chronic back pain/arthritis    Disposition: Matthew [name] appears stable. He has completed 2 cycles of FOLFOX. Plan to proceed with cycle 3 today as scheduled. He will return for a followup visit and cycle 4 in 2 weeks. He will contact the office in the interim with any problems.  Plan reviewed  with Dr. Benay Spice.  Ned Card ANP/GNP-BC   08/30/2014  10:20 AM

## 2014-08-30 NOTE — Patient Instructions (Signed)
[facility] Discharge Instructions for Patients Receiving Chemotherapy  Today you received the following chemotherapy agents Oxaliplatin, Leucovorin, Flurourocil  To help prevent nausea and vomiting after your treatment, we encourage you to take your nausea medication Zofran as directed   If you develop nausea and vomiting that is not controlled by your nausea medication, call the clinic.   BELOW ARE SYMPTOMS THAT SHOULD BE REPORTED IMMEDIATELY:  *FEVER GREATER THAN 100.5 F  *CHILLS WITH OR WITHOUT FEVER  NAUSEA AND VOMITING THAT IS NOT CONTROLLED WITH YOUR NAUSEA MEDICATION  *UNUSUAL SHORTNESS OF BREATH  *UNUSUAL BRUISING OR BLEEDING  TENDERNESS IN MOUTH AND THROAT WITH OR WITHOUT PRESENCE OF ULCERS  *URINARY PROBLEMS  *BOWEL PROBLEMS  UNUSUAL RASH Items with * indicate a potential emergency and should be followed up as soon as possible.  Feel free to call the clinic you have any questions or concerns. The clinic phone number is [phone].

## 2014-09-01 ENCOUNTER — Ambulatory Visit (HOSPITAL_BASED_OUTPATIENT_CLINIC_OR_DEPARTMENT_OTHER): Payer: Medicare Other

## 2014-09-01 ENCOUNTER — Ambulatory Visit (HOSPITAL_COMMUNITY): Payer: Medicare Other

## 2014-09-01 VITALS — BP 104/69 | HR 100 | Temp 97.7°F

## 2014-09-01 DIAGNOSIS — C189 Malignant neoplasm of colon, unspecified: Secondary | ICD-10-CM

## 2014-09-01 DIAGNOSIS — C180 Malignant neoplasm of cecum: Secondary | ICD-10-CM

## 2014-09-01 MED ORDER — SODIUM CHLORIDE 0.9 % IJ SOLN
10.0000 mL | INTRAMUSCULAR | Status: DC | PRN
  Administered 2014-09-01: 10 mL
  Filled 2014-09-01: qty 10

## 2014-09-01 MED ORDER — HEPARIN SOD (PORK) LOCK FLUSH 100 UNIT/ML IV SOLN
500.0000 [IU] | Freq: Once | INTRAVENOUS | Status: AC | PRN
  Administered 2014-09-01: 500 [IU]
  Filled 2014-09-01: qty 5

## 2014-09-01 NOTE — Patient Instructions (Signed)
Fluorouracil, 5FU; Diclofenac topical cream What is this medicine? FLUOROURACIL; DICLOFENAC (flure oh YOOR a sil; dye KLOE fen ak) is a combination of a topical chemotherapy agent and non-steroidal anti-inflammatory drug (NSAID). It is used on the skin to treat skin cancer and skin conditions that could become cancer. This medicine may be used for other purposes; ask your health care provider or pharmacist if you have questions. COMMON BRAND NAME(S): FLUORAC What should I tell my health care provider before I take this medicine? They need to know if you have any of these conditions: -bleeding problems -cigarette smoker -DPD enzyme deficiency -heart disease -high blood pressure -if you frequently drink alcohol containing drinks -kidney disease -liver disease -open or infected skin -stomach problems -swelling or open sores at the treatment site -recent or planned coronary artery bypass graft (CABG) surgery -an unusual or allergic reaction to fluorouracil, diclofenac, aspirin, other NSAIDs, other medicines, foods, dyes, or preservatives -pregnant or trying to get pregnant -breast-feeding How should I use this medicine? This medicine is only for use on the skin. Follow the directions on the prescription label. Wash hands before and after use. Wash affected area and gently pat dry. To apply this medicine use a cotton-tipped applicator, or use gloves if applying with fingertips. If applied with unprotected fingertips, it is very important to wash your hands well after you apply this medicine. Avoid applying to the eyes, nose, or mouth. Apply enough medicine to cover the affected area. You can cover the area with a light gauze dressing, but do not use tight or air-tight dressings. Finish the full course prescribed by your doctor or health care professional, even if you think your condition is better. Do not stop taking except on the advice of your doctor or health care professional. Talk to your  pediatrician regarding the use of this medicine in children. Special care may be needed. Overdosage: If you think you've taken too much of this medicine contact a poison control center or emergency room at once. Overdosage: If you think you have taken too much of this medicine contact a poison control center or emergency room at once. NOTE: This medicine is only for you. Do not share this medicine with others. What if I miss a dose? If you miss a dose, apply it as soon as you can. If it is almost time for your next dose, only use that dose. Do not apply extra doses. Contact your doctor or health care professional if you miss more than one dose. What may interact with this medicine? Interactions are not expected. Do not use any other skin products without telling your doctor or health care professional. This list may not describe all possible interactions. Give your health care provider a list of all the medicines, herbs, non-prescription drugs, or dietary supplements you use. Also tell them if you smoke, drink alcohol, or use illegal drugs. Some items may interact with your medicine. What should I watch for while using this medicine? Visit your doctor or health care professional for checks on your progress. You will need to use this medicine for 2 to 6 weeks. This may be longer depending on the condition being treated. You may not see full healing for another 1 to 2 months after you stop using the medicine. Treated areas of skin can look unsightly during and for several weeks after treatment with this medicine. This medicine can make you more sensitive to the sun. Keep out of the sun. If you cannot avoid being in   the sun, wear protective clothing and use sunscreen. Do not use sun lamps or tanning beds/booths. Where should I keep my What side effects may I notice from receiving this medicine? Side effects that you should report to your doctor or health care professional as soon as possible: -allergic  reactions like skin rash, itching or hives, swelling of the face, lips, or tongue -black or bloody stools, blood in the urine or vomit -blurred vision -chest pain -difficulty breathing or wheezing -redness, blistering, peeling or loosening of the skin, including inside the mouth -severe redness and swelling of normal skin -slurred speech or weakness on one side of the body -trouble passing urine or change in the amount of urine -unexplained weight gain or swelling -unusually weak or tired -yellowing of eyes or skin Side effects that usually do not require medical attention (Report these to your doctor or health care professional if they continue or are bothersome.): -increased sensitivity of the skin to sun and ultraviolet light -pain and burning of the affected area -scaling or swelling of the affected area -skin rash, itching of the affected area -tenderness This list may not describe all possible side effects. Call your doctor for medical advice about side effects. You may report side effects to FDA at 1-800-FDA-1088. Where should I keep my medicine? Keep out of the reach of children. Store at room temperature between 20 and 25 degrees C (68 and 77 degrees F). Throw away any unused medicine after the expiration date. NOTE: This sheet is a summary. It may not cover all possible information. If you have questions about this medicine, talk to your doctor, pharmacist, or health care provider.  2015, [employer] Standard. (2014-04-03 11:09:58)  

## 2014-09-10 ENCOUNTER — Other Ambulatory Visit: Payer: Self-pay | Admitting: Oncology

## 2014-09-13 ENCOUNTER — Ambulatory Visit (HOSPITAL_BASED_OUTPATIENT_CLINIC_OR_DEPARTMENT_OTHER): Payer: Medicare Other | Admitting: Nurse Practitioner

## 2014-09-13 ENCOUNTER — Telehealth: Payer: Self-pay | Admitting: Nurse Practitioner

## 2014-09-13 ENCOUNTER — Other Ambulatory Visit (HOSPITAL_BASED_OUTPATIENT_CLINIC_OR_DEPARTMENT_OTHER): Payer: Medicare Other

## 2014-09-13 ENCOUNTER — Ambulatory Visit (HOSPITAL_BASED_OUTPATIENT_CLINIC_OR_DEPARTMENT_OTHER): Payer: Medicare Other

## 2014-09-13 ENCOUNTER — Telehealth: Payer: Self-pay | Admitting: *Deleted

## 2014-09-13 VITALS — BP 124/75 | HR 107 | Temp 98.5°F | Resp 20 | Ht 69.0 in | Wt 180.9 lb

## 2014-09-13 DIAGNOSIS — C189 Malignant neoplasm of colon, unspecified: Secondary | ICD-10-CM | POA: Diagnosis not present

## 2014-09-13 DIAGNOSIS — C180 Malignant neoplasm of cecum: Secondary | ICD-10-CM | POA: Diagnosis not present

## 2014-09-13 DIAGNOSIS — Z5111 Encounter for antineoplastic chemotherapy: Secondary | ICD-10-CM

## 2014-09-13 LAB — CBC WITH DIFFERENTIAL/PLATELET
BASO%: 0.8 % (ref 0.0–2.0)
BASOS ABS: 0 10*3/uL (ref 0.0–0.1)
EOS ABS: 0.1 10*3/uL (ref 0.0–0.5)
EOS%: 2.5 % (ref 0.0–7.0)
HEMATOCRIT: 36.5 % — AB (ref 38.4–49.9)
HGB: 12.2 g/dL — ABNORMAL LOW (ref 13.0–17.1)
LYMPH#: 1.3 10*3/uL (ref 0.9–3.3)
LYMPH%: 26.2 % (ref 14.0–49.0)
MCH: 32.7 pg (ref 27.2–33.4)
MCHC: 33.5 g/dL (ref 32.0–36.0)
MCV: 97.6 fL (ref 79.3–98.0)
MONO#: 0.7 10*3/uL (ref 0.1–0.9)
MONO%: 14.4 % — ABNORMAL HIGH (ref 0.0–14.0)
NEUT%: 56.1 % (ref 39.0–75.0)
NEUTROS ABS: 2.8 10*3/uL (ref 1.5–6.5)
Platelets: 132 10*3/uL — ABNORMAL LOW (ref 140–400)
RBC: 3.75 10*6/uL — ABNORMAL LOW (ref 4.20–5.82)
RDW: 16.9 % — ABNORMAL HIGH (ref 11.0–14.6)
WBC: 5 10*3/uL (ref 4.0–10.3)

## 2014-09-13 LAB — COMPREHENSIVE METABOLIC PANEL (CC13)
ALBUMIN: 3.4 g/dL — AB (ref 3.5–5.0)
ALT: 34 U/L (ref 0–55)
ANION GAP: 8 meq/L (ref 3–11)
AST: 32 U/L (ref 5–34)
Alkaline Phosphatase: 109 U/L (ref 40–150)
BUN: 15.3 mg/dL (ref 7.0–26.0)
CALCIUM: 9.5 mg/dL (ref 8.4–10.4)
CHLORIDE: 106 meq/L (ref 98–109)
CO2: 24 meq/L (ref 22–29)
Creatinine: 1.1 mg/dL (ref 0.7–1.3)
Glucose: 131 mg/dl (ref 70–140)
POTASSIUM: 4.5 meq/L (ref 3.5–5.1)
Sodium: 138 mEq/L (ref 136–145)
Total Bilirubin: 0.43 mg/dL (ref 0.20–1.20)
Total Protein: 6.8 g/dL (ref 6.4–8.3)

## 2014-09-13 MED ORDER — DEXAMETHASONE SODIUM PHOSPHATE 10 MG/ML IJ SOLN
INTRAMUSCULAR | Status: AC
  Filled 2014-09-13: qty 1

## 2014-09-13 MED ORDER — DEXTROSE 5 % IV SOLN
Freq: Once | INTRAVENOUS | Status: AC
  Administered 2014-09-13: 12:00:00 via INTRAVENOUS

## 2014-09-13 MED ORDER — DEXAMETHASONE SODIUM PHOSPHATE 10 MG/ML IJ SOLN
10.0000 mg | Freq: Once | INTRAMUSCULAR | Status: AC
  Administered 2014-09-13: 10 mg via INTRAVENOUS

## 2014-09-13 MED ORDER — SODIUM CHLORIDE 0.9 % IV SOLN
2400.0000 mg/m2 | INTRAVENOUS | Status: DC
  Administered 2014-09-13: 4750 mg via INTRAVENOUS
  Filled 2014-09-13: qty 95

## 2014-09-13 MED ORDER — DEXTROSE 5 % IV SOLN
400.0000 mg/m2 | Freq: Once | INTRAVENOUS | Status: AC
  Administered 2014-09-13: 800 mg via INTRAVENOUS
  Filled 2014-09-13: qty 40

## 2014-09-13 MED ORDER — FLUOROURACIL CHEMO INJECTION 2.5 GM/50ML
400.0000 mg/m2 | Freq: Once | INTRAVENOUS | Status: AC
  Administered 2014-09-13: 800 mg via INTRAVENOUS
  Filled 2014-09-13: qty 16

## 2014-09-13 MED ORDER — ONDANSETRON 8 MG/NS 50 ML IVPB
INTRAVENOUS | Status: AC
  Filled 2014-09-13: qty 8

## 2014-09-13 MED ORDER — OXALIPLATIN CHEMO INJECTION 100 MG/20ML
85.0000 mg/m2 | Freq: Once | INTRAVENOUS | Status: AC
  Administered 2014-09-13: 170 mg via INTRAVENOUS
  Filled 2014-09-13: qty 34

## 2014-09-13 MED ORDER — ONDANSETRON 8 MG/50ML IVPB (CHCC)
8.0000 mg | Freq: Once | INTRAVENOUS | Status: AC
Start: 2014-09-13 — End: 2014-09-13
  Administered 2014-09-13: 8 mg via INTRAVENOUS

## 2014-09-13 NOTE — Telephone Encounter (Signed)
Per staff message and POF I have scheduled appts. Advised scheduler of appts. JMW  

## 2014-09-13 NOTE — Patient Instructions (Addendum)
[facility] Discharge Instructions for Patients Receiving Chemotherapy  Today you received the following chemotherapy agents  To help prevent nausea and vomiting after your treatment, we encourage you to take your nausea medication     If you develop nausea and vomiting that is not controlled by your nausea medication, call the clinic.   BELOW ARE SYMPTOMS THAT SHOULD BE REPORTED IMMEDIATELY:  *FEVER GREATER THAN 100.5 F  *CHILLS WITH OR WITHOUT FEVER  NAUSEA AND VOMITING THAT IS NOT CONTROLLED WITH YOUR NAUSEA MEDICATION  *UNUSUAL SHORTNESS OF BREATH  *UNUSUAL BRUISING OR BLEEDING  TENDERNESS IN MOUTH AND THROAT WITH OR WITHOUT PRESENCE OF ULCERS  *URINARY PROBLEMS  *BOWEL PROBLEMS  UNUSUAL RASH Items with * indicate a potential emergency and should be followed up as soon as possible.  Feel free to call the clinic you have any questions or concerns. The clinic phone number is [phone].   [facility] Discharge Instructions for Patients Receiving Chemotherapy  Today you received the following chemotherapy agents Oxaliplatin, Leucovorin, Flurourocil  To help prevent nausea and vomiting after your treatment, we encourage you to take your nausea medication Zofran as directed   If you develop nausea and vomiting that is not controlled by your nausea medication, call the clinic.   BELOW ARE SYMPTOMS THAT SHOULD BE REPORTED IMMEDIATELY:  *FEVER GREATER THAN 100.5 F  *CHILLS WITH OR WITHOUT FEVER  NAUSEA AND VOMITING THAT IS NOT CONTROLLED WITH YOUR NAUSEA MEDICATION  *UNUSUAL SHORTNESS OF BREATH  *UNUSUAL BRUISING OR BLEEDING  TENDERNESS IN MOUTH AND THROAT WITH OR WITHOUT PRESENCE OF ULCERS  *URINARY PROBLEMS  *BOWEL PROBLEMS  UNUSUAL RASH Items with * indicate a potential emergency and should be followed up as soon as possible.  Feel free to call the clinic you have any questions or concerns. The clinic phone number is [phone]  [phone].

## 2014-09-13 NOTE — Progress Notes (Signed)
  [facility] OFFICE PROGRESS NOTE   Diagnosis:  Colon cancer  INTERVAL HISTORY:   Matthew [name] returns as scheduled. He completed cycle 3 FOLFOX on 08/30/2014. He denies nausea/vomiting. No mouth sores. No diarrhea. The cold sensitivity lasted for 4 or 5 days. He denies persistent neuropathy symptoms. No hand or foot pain or redness. He has a good appetite. He reports stable chronic back pain.  Objective:  Vital signs in last 24 hours:  Blood pressure 124/75, pulse 107, temperature 98.5 F (36.9 C), temperature source Oral, resp. rate 20, height $RemoveBe'5\' 9"'mLDLPlrOp$  (1.753 m), weight 180 lb 14.4 oz (82.056 kg).    HEENT: No thrush or ulcers. Resp: Lungs clear bilaterally. Cardio: Regular rate and rhythm. GI: Abdomen soft and nontender. No hepatomegaly. Vascular: No leg edema. Calves soft and nontender. Neuro: Vibratory sense minimally decreased over the fingertips per tuning fork exam.  Skin: No rash. Port-A-Cath without erythema.    Lab Results:  Lab Results  Component Value Date   WBC 4.0 08/30/2014   HGB 11.2* 08/30/2014   HCT 33.8* 08/30/2014   MCV 97.2 08/30/2014   PLT 133* 08/30/2014   NEUTROABS 2.2 08/30/2014    Imaging:  No results found.  Medications: I have reviewed the patient's current medications.  Assessment/Plan: 1. 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   Microsatellite instability-High, loss of MLH1 and equivocal loss of PMS2 expression; BRAF mutation detected.  Markedly elevated preoperative CEA.   Cycle 1 FOLFOX 08/02/2014.   Cycle 2 FOLFOX 08/16/2014.   Cycle 3 FOLFOX 08/30/2014. 2. History of tobacco use/CT evidence of COPD  3. History of peptic ulcer disease  4. History of colon polyps, multiple polyps in the 06/30/2014 resection specimen  5. Chronic back pain/arthritis  Disposition: Matthew [name] appears stable. He has completed 3 cycles of FOLFOX. Plan to  proceed with cycle 4 today as scheduled. He will return for a followup visit and cycle 5 in 2 weeks. He will contact the office in the interim with any problems.    Ned Card ANP/GNP-BC   09/13/2014  10:36 AM

## 2014-09-13 NOTE — Progress Notes (Signed)
Patient does not want flu vaccine at this time.  He will get it later.

## 2014-09-13 NOTE — Telephone Encounter (Signed)
, °

## 2014-09-14 LAB — CEA: CEA: 4.3 ng/mL (ref 0.0–5.0)

## 2014-09-15 ENCOUNTER — Ambulatory Visit (HOSPITAL_BASED_OUTPATIENT_CLINIC_OR_DEPARTMENT_OTHER): Payer: Medicare Other

## 2014-09-15 VITALS — BP 111/69 | HR 101 | Temp 97.5°F | Resp 20

## 2014-09-15 DIAGNOSIS — C180 Malignant neoplasm of cecum: Secondary | ICD-10-CM | POA: Diagnosis not present

## 2014-09-15 DIAGNOSIS — C189 Malignant neoplasm of colon, unspecified: Secondary | ICD-10-CM

## 2014-09-15 MED ORDER — HEPARIN SOD (PORK) LOCK FLUSH 100 UNIT/ML IV SOLN
500.0000 [IU] | Freq: Once | INTRAVENOUS | Status: AC | PRN
  Administered 2014-09-15: 500 [IU]
  Filled 2014-09-15: qty 5

## 2014-09-15 MED ORDER — SODIUM CHLORIDE 0.9 % IJ SOLN
10.0000 mL | INTRAMUSCULAR | Status: DC | PRN
  Administered 2014-09-15: 10 mL
  Filled 2014-09-15: qty 10

## 2014-09-15 NOTE — Patient Instructions (Signed)

## 2014-09-24 ENCOUNTER — Other Ambulatory Visit: Payer: Self-pay | Admitting: Oncology

## 2014-09-27 ENCOUNTER — Ambulatory Visit (HOSPITAL_BASED_OUTPATIENT_CLINIC_OR_DEPARTMENT_OTHER): Payer: Medicare Other | Admitting: Oncology

## 2014-09-27 ENCOUNTER — Telehealth: Payer: Self-pay | Admitting: Oncology

## 2014-09-27 ENCOUNTER — Ambulatory Visit (HOSPITAL_BASED_OUTPATIENT_CLINIC_OR_DEPARTMENT_OTHER): Payer: Medicare Other

## 2014-09-27 ENCOUNTER — Other Ambulatory Visit (HOSPITAL_BASED_OUTPATIENT_CLINIC_OR_DEPARTMENT_OTHER): Payer: Medicare Other

## 2014-09-27 VITALS — BP 108/65 | HR 105 | Temp 98.2°F | Resp 18 | Ht 69.0 in | Wt 182.9 lb

## 2014-09-27 DIAGNOSIS — C189 Malignant neoplasm of colon, unspecified: Secondary | ICD-10-CM

## 2014-09-27 DIAGNOSIS — R0609 Other forms of dyspnea: Secondary | ICD-10-CM | POA: Diagnosis not present

## 2014-09-27 DIAGNOSIS — J449 Chronic obstructive pulmonary disease, unspecified: Secondary | ICD-10-CM | POA: Diagnosis not present

## 2014-09-27 DIAGNOSIS — C180 Malignant neoplasm of cecum: Secondary | ICD-10-CM

## 2014-09-27 DIAGNOSIS — M549 Dorsalgia, unspecified: Secondary | ICD-10-CM | POA: Diagnosis not present

## 2014-09-27 DIAGNOSIS — Z5111 Encounter for antineoplastic chemotherapy: Secondary | ICD-10-CM | POA: Diagnosis not present

## 2014-09-27 LAB — CBC WITH DIFFERENTIAL/PLATELET
BASO%: 0.9 % (ref 0.0–2.0)
Basophils Absolute: 0 10*3/uL (ref 0.0–0.1)
EOS%: 3 % (ref 0.0–7.0)
Eosinophils Absolute: 0.1 10*3/uL (ref 0.0–0.5)
HCT: 35.7 % — ABNORMAL LOW (ref 38.4–49.9)
HEMOGLOBIN: 12 g/dL — AB (ref 13.0–17.1)
LYMPH#: 1 10*3/uL (ref 0.9–3.3)
LYMPH%: 26.7 % (ref 14.0–49.0)
MCH: 33.2 pg (ref 27.2–33.4)
MCHC: 33.6 g/dL (ref 32.0–36.0)
MCV: 98.9 fL — ABNORMAL HIGH (ref 79.3–98.0)
MONO#: 0.8 10*3/uL (ref 0.1–0.9)
MONO%: 20.4 % — ABNORMAL HIGH (ref 0.0–14.0)
NEUT#: 1.9 10*3/uL (ref 1.5–6.5)
NEUT%: 49 % (ref 39.0–75.0)
Platelets: 109 10*3/uL — ABNORMAL LOW (ref 140–400)
RBC: 3.61 10*6/uL — ABNORMAL LOW (ref 4.20–5.82)
RDW: 18.4 % — ABNORMAL HIGH (ref 11.0–14.6)
WBC: 3.8 10*3/uL — ABNORMAL LOW (ref 4.0–10.3)

## 2014-09-27 LAB — COMPREHENSIVE METABOLIC PANEL (CC13)
ALK PHOS: 113 U/L (ref 40–150)
ALT: 39 U/L (ref 0–55)
AST: 33 U/L (ref 5–34)
Albumin: 3.3 g/dL — ABNORMAL LOW (ref 3.5–5.0)
Anion Gap: 10 mEq/L (ref 3–11)
BUN: 13.4 mg/dL (ref 7.0–26.0)
CO2: 22 mEq/L (ref 22–29)
CREATININE: 1.3 mg/dL (ref 0.7–1.3)
Calcium: 9.3 mg/dL (ref 8.4–10.4)
Chloride: 106 mEq/L (ref 98–109)
GLUCOSE: 131 mg/dL (ref 70–140)
Potassium: 4.7 mEq/L (ref 3.5–5.1)
SODIUM: 138 meq/L (ref 136–145)
Total Bilirubin: 0.5 mg/dL (ref 0.20–1.20)
Total Protein: 6.7 g/dL (ref 6.4–8.3)

## 2014-09-27 MED ORDER — ONDANSETRON 8 MG/50ML IVPB (CHCC)
8.0000 mg | Freq: Once | INTRAVENOUS | Status: AC
  Administered 2014-09-27: 8 mg via INTRAVENOUS

## 2014-09-27 MED ORDER — DEXTROSE 5 % IV SOLN
Freq: Once | INTRAVENOUS | Status: AC
  Administered 2014-09-27: 12:00:00 via INTRAVENOUS

## 2014-09-27 MED ORDER — DEXAMETHASONE SODIUM PHOSPHATE 10 MG/ML IJ SOLN
INTRAMUSCULAR | Status: AC
  Filled 2014-09-27: qty 1

## 2014-09-27 MED ORDER — OXALIPLATIN CHEMO INJECTION 100 MG/20ML
85.0000 mg/m2 | Freq: Once | INTRAVENOUS | Status: AC
  Administered 2014-09-27: 170 mg via INTRAVENOUS
  Filled 2014-09-27: qty 34

## 2014-09-27 MED ORDER — DEXAMETHASONE SODIUM PHOSPHATE 10 MG/ML IJ SOLN
10.0000 mg | Freq: Once | INTRAMUSCULAR | Status: AC
  Administered 2014-09-27: 10 mg via INTRAVENOUS

## 2014-09-27 MED ORDER — ONDANSETRON 8 MG/NS 50 ML IVPB
INTRAVENOUS | Status: AC
  Filled 2014-09-27: qty 8

## 2014-09-27 MED ORDER — DEXTROSE 5 % IV SOLN
400.0000 mg/m2 | Freq: Once | INTRAVENOUS | Status: AC
  Administered 2014-09-27: 800 mg via INTRAVENOUS
  Filled 2014-09-27: qty 40

## 2014-09-27 MED ORDER — FLUOROURACIL CHEMO INJECTION 5 GM/100ML
2400.0000 mg/m2 | INTRAVENOUS | Status: DC
  Administered 2014-09-27: 4750 mg via INTRAVENOUS
  Filled 2014-09-27: qty 95

## 2014-09-27 MED ORDER — SODIUM CHLORIDE 0.9 % IJ SOLN
10.0000 mL | INTRAMUSCULAR | Status: DC | PRN
  Filled 2014-09-27: qty 10

## 2014-09-27 MED ORDER — FLUOROURACIL CHEMO INJECTION 2.5 GM/50ML
400.0000 mg/m2 | Freq: Once | INTRAVENOUS | Status: AC
  Administered 2014-09-27: 800 mg via INTRAVENOUS
  Filled 2014-09-27: qty 16

## 2014-09-27 MED ORDER — HEPARIN SOD (PORK) LOCK FLUSH 100 UNIT/ML IV SOLN
500.0000 [IU] | Freq: Once | INTRAVENOUS | Status: DC | PRN
  Filled 2014-09-27: qty 5

## 2014-09-27 NOTE — Patient Instructions (Signed)
[facility] Discharge Instructions for Patients Receiving Chemotherapy  Today you received the following chemotherapy agents Oxaliplatin, Leucovorin, Flurourocil  To help prevent nausea and vomiting after your treatment, we encourage you to take your nausea medication Zofran as directed   If you develop nausea and vomiting that is not controlled by your nausea medication, call the clinic.   BELOW ARE SYMPTOMS THAT SHOULD BE REPORTED IMMEDIATELY:  *FEVER GREATER THAN 100.5 F  *CHILLS WITH OR WITHOUT FEVER  NAUSEA AND VOMITING THAT IS NOT CONTROLLED WITH YOUR NAUSEA MEDICATION  *UNUSUAL SHORTNESS OF BREATH  *UNUSUAL BRUISING OR BLEEDING  TENDERNESS IN MOUTH AND THROAT WITH OR WITHOUT PRESENCE OF ULCERS  *URINARY PROBLEMS  *BOWEL PROBLEMS  UNUSUAL RASH Items with * indicate a potential emergency and should be followed up as soon as possible.  Feel free to call the clinic you have any questions or concerns. The clinic phone number is [phone].

## 2014-09-27 NOTE — Progress Notes (Signed)
  [facility] OFFICE PROGRESS NOTE   Diagnosis: Colon cancer  INTERVAL HISTORY:   He returns as scheduled. He completed another cycle of FOLFOX 09/13/2014. He reports developing transient dyspnea 2-3 days following chemotherapy. This occurred when he was walking to the mailbox in the cool weather. No neuropathy symptoms. No nausea, mouth sores, or diarrhea. He reports feeling "bad "for several days after chemotherapy. He cannot be more specific.  Objective:  Vital signs in last 24 hours:  Blood pressure 108/65, pulse 105, temperature 98.2 F (36.8 C), temperature source Oral, resp. rate 18, height $RemoveBe'5\' 9"'kPhMDmqmL$  (1.753 m), weight 182 lb 14.4 oz (82.963 kg), SpO2 100.00%.    HEENT: No thrush or ulcers Resp: Lungs clear bilaterally Cardio: Regular rate and rhythm GI: No hepatomegaly, nontender Vascular: No leg edema Neuro: The vibratory sense is intact at the fingertips bilaterally  Skin: No rash   Portacath/PICC-without erythema  Lab Results:  Lab Results  Component Value Date   WBC 3.8* 09/27/2014   HGB 12.0* 09/27/2014   HCT 35.7* 09/27/2014   MCV 98.9* 09/27/2014   PLT 109* 09/27/2014   NEUTROABS 1.9 09/27/2014    Medications: I have reviewed the patient's current medications.  Assessment/Plan: 1. 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  Microsatellite instability-High, loss of MLH1 and equivocal loss of PMS2 expression; BRAF mutation detected.  Markedly elevated preoperative CEA.  Cycle 1 FOLFOX 08/02/2014.  Cycle 2 FOLFOX 08/16/2014.  Cycle 3 FOLFOX 08/30/2014. Cycle 4 FOLFOX 09/13/2049 2. History of tobacco use/CT evidence of COPD  3. History of peptic ulcer disease  4. History of colon polyps, multiple polyps in the 06/30/2014 resection specimen  5. Chronic back pain/arthritis  Disposition:  Matthew [name] has completed 4 cycles of FOLFOX. The etiology of the transient  dyspnea following cycle 4 it is unclear. He will contact us for recurrent dyspnea following this cycle. This may have been related to oxaliplatin neuropathy. The plan is to proceed with cycle 5 FOLFOX today. He will return for an office visit in 2 weeks.  Betsy Coder, MD  09/27/2014  9:52 AM

## 2014-09-27 NOTE — Telephone Encounter (Signed)
Gave AVS & sch apt for 10/25/14-[place]

## 2014-09-29 ENCOUNTER — Ambulatory Visit (HOSPITAL_BASED_OUTPATIENT_CLINIC_OR_DEPARTMENT_OTHER): Payer: Medicare Other

## 2014-09-29 DIAGNOSIS — Z5189 Encounter for other specified aftercare: Secondary | ICD-10-CM | POA: Diagnosis not present

## 2014-09-29 DIAGNOSIS — C180 Malignant neoplasm of cecum: Secondary | ICD-10-CM

## 2014-09-29 DIAGNOSIS — C189 Malignant neoplasm of colon, unspecified: Secondary | ICD-10-CM

## 2014-09-29 MED ORDER — SODIUM CHLORIDE 0.9 % IJ SOLN
10.0000 mL | INTRAMUSCULAR | Status: DC | PRN
  Administered 2014-09-29: 10 mL
  Filled 2014-09-29: qty 10

## 2014-09-29 MED ORDER — HEPARIN SOD (PORK) LOCK FLUSH 100 UNIT/ML IV SOLN
500.0000 [IU] | Freq: Once | INTRAVENOUS | Status: AC | PRN
  Administered 2014-09-29: 500 [IU]
  Filled 2014-09-29: qty 5

## 2014-10-08 ENCOUNTER — Other Ambulatory Visit: Payer: Self-pay | Admitting: Oncology

## 2014-10-11 ENCOUNTER — Other Ambulatory Visit (HOSPITAL_BASED_OUTPATIENT_CLINIC_OR_DEPARTMENT_OTHER): Payer: Medicare Other

## 2014-10-11 ENCOUNTER — Telehealth: Payer: Self-pay | Admitting: *Deleted

## 2014-10-11 ENCOUNTER — Telehealth: Payer: Self-pay | Admitting: Nurse Practitioner

## 2014-10-11 ENCOUNTER — Ambulatory Visit (HOSPITAL_BASED_OUTPATIENT_CLINIC_OR_DEPARTMENT_OTHER): Payer: Medicare Other | Admitting: Nurse Practitioner

## 2014-10-11 ENCOUNTER — Ambulatory Visit (HOSPITAL_BASED_OUTPATIENT_CLINIC_OR_DEPARTMENT_OTHER): Payer: Medicare Other

## 2014-10-11 VITALS — BP 103/68 | HR 107 | Resp 18

## 2014-10-11 VITALS — BP 92/62 | HR 100 | Temp 97.7°F | Resp 19 | Ht 69.0 in | Wt 182.6 lb

## 2014-10-11 DIAGNOSIS — C180 Malignant neoplasm of cecum: Secondary | ICD-10-CM

## 2014-10-11 DIAGNOSIS — C189 Malignant neoplasm of colon, unspecified: Secondary | ICD-10-CM

## 2014-10-11 DIAGNOSIS — Z720 Tobacco use: Secondary | ICD-10-CM | POA: Diagnosis not present

## 2014-10-11 DIAGNOSIS — Z5111 Encounter for antineoplastic chemotherapy: Secondary | ICD-10-CM | POA: Diagnosis not present

## 2014-10-11 DIAGNOSIS — I959 Hypotension, unspecified: Secondary | ICD-10-CM | POA: Diagnosis not present

## 2014-10-11 DIAGNOSIS — M549 Dorsalgia, unspecified: Secondary | ICD-10-CM

## 2014-10-11 DIAGNOSIS — J449 Chronic obstructive pulmonary disease, unspecified: Secondary | ICD-10-CM | POA: Diagnosis not present

## 2014-10-11 LAB — COMPREHENSIVE METABOLIC PANEL (CC13)
ALBUMIN: 3.4 g/dL — AB (ref 3.5–5.0)
ALT: 37 U/L (ref 0–55)
AST: 36 U/L — ABNORMAL HIGH (ref 5–34)
Alkaline Phosphatase: 111 U/L (ref 40–150)
Anion Gap: 10 mEq/L (ref 3–11)
BUN: 15.3 mg/dL (ref 7.0–26.0)
CALCIUM: 9.3 mg/dL (ref 8.4–10.4)
CHLORIDE: 106 meq/L (ref 98–109)
CO2: 21 mEq/L — ABNORMAL LOW (ref 22–29)
Creatinine: 1.3 mg/dL (ref 0.7–1.3)
Glucose: 133 mg/dl (ref 70–140)
Potassium: 4.2 mEq/L (ref 3.5–5.1)
SODIUM: 138 meq/L (ref 136–145)
Total Bilirubin: 0.45 mg/dL (ref 0.20–1.20)
Total Protein: 6.5 g/dL (ref 6.4–8.3)

## 2014-10-11 LAB — CBC WITH DIFFERENTIAL/PLATELET
BASO%: 0.3 % (ref 0.0–2.0)
Basophils Absolute: 0 10*3/uL (ref 0.0–0.1)
EOS%: 2.3 % (ref 0.0–7.0)
Eosinophils Absolute: 0.1 10*3/uL (ref 0.0–0.5)
HCT: 34.9 % — ABNORMAL LOW (ref 38.4–49.9)
HGB: 11.8 g/dL — ABNORMAL LOW (ref 13.0–17.1)
LYMPH#: 1.5 10*3/uL (ref 0.9–3.3)
LYMPH%: 37.2 % (ref 14.0–49.0)
MCH: 33.6 pg — AB (ref 27.2–33.4)
MCHC: 33.8 g/dL (ref 32.0–36.0)
MCV: 99.4 fL — ABNORMAL HIGH (ref 79.3–98.0)
MONO#: 0.6 10*3/uL (ref 0.1–0.9)
MONO%: 14.8 % — AB (ref 0.0–14.0)
NEUT#: 1.8 10*3/uL (ref 1.5–6.5)
NEUT%: 45.4 % (ref 39.0–75.0)
Platelets: 101 10*3/uL — ABNORMAL LOW (ref 140–400)
RBC: 3.51 10*6/uL — AB (ref 4.20–5.82)
RDW: 18.4 % — AB (ref 11.0–14.6)
WBC: 4 10*3/uL (ref 4.0–10.3)

## 2014-10-11 MED ORDER — SODIUM CHLORIDE 0.9 % IV SOLN
2400.0000 mg/m2 | INTRAVENOUS | Status: DC
  Administered 2014-10-11: 4750 mg via INTRAVENOUS
  Filled 2014-10-11: qty 95

## 2014-10-11 MED ORDER — OXALIPLATIN CHEMO INJECTION 100 MG/20ML
85.0000 mg/m2 | Freq: Once | INTRAVENOUS | Status: AC
  Administered 2014-10-11: 170 mg via INTRAVENOUS
  Filled 2014-10-11: qty 34

## 2014-10-11 MED ORDER — FLUOROURACIL CHEMO INJECTION 2.5 GM/50ML
400.0000 mg/m2 | Freq: Once | INTRAVENOUS | Status: AC
  Administered 2014-10-11: 800 mg via INTRAVENOUS
  Filled 2014-10-11: qty 16

## 2014-10-11 MED ORDER — ONDANSETRON 8 MG/NS 50 ML IVPB
INTRAVENOUS | Status: AC
  Filled 2014-10-11: qty 8

## 2014-10-11 MED ORDER — DEXAMETHASONE SODIUM PHOSPHATE 10 MG/ML IJ SOLN
10.0000 mg | Freq: Once | INTRAMUSCULAR | Status: AC
  Administered 2014-10-11: 10 mg via INTRAVENOUS

## 2014-10-11 MED ORDER — DEXTROSE 5 % IV SOLN
Freq: Once | INTRAVENOUS | Status: AC
  Administered 2014-10-11: 11:00:00 via INTRAVENOUS

## 2014-10-11 MED ORDER — LEUCOVORIN CALCIUM INJECTION 350 MG
400.0000 mg/m2 | Freq: Once | INTRAVENOUS | Status: AC
  Administered 2014-10-11: 800 mg via INTRAVENOUS
  Filled 2014-10-11: qty 40

## 2014-10-11 MED ORDER — ONDANSETRON 8 MG/50ML IVPB (CHCC)
8.0000 mg | Freq: Once | INTRAVENOUS | Status: AC
  Administered 2014-10-11: 8 mg via INTRAVENOUS

## 2014-10-11 MED ORDER — DEXAMETHASONE SODIUM PHOSPHATE 10 MG/ML IJ SOLN
INTRAMUSCULAR | Status: AC
  Filled 2014-10-11: qty 1

## 2014-10-11 NOTE — Telephone Encounter (Signed)
Per staff message and POF I have scheduled appts. Advised scheduler of appts. JMW  

## 2014-10-11 NOTE — Patient Instructions (Signed)
[facility] Discharge Instructions for Patients Receiving Chemotherapy  Today you received the following chemotherapy agents Oxaliplatin, Leucovorin, 5FU.  To help prevent nausea and vomiting after your treatment, we encourage you to take your nausea medication Compazine 10 mg as ordered.   If you develop nausea and vomiting that is not controlled by your nausea medication, call the clinic.   BELOW ARE SYMPTOMS THAT SHOULD BE REPORTED IMMEDIATELY:  *FEVER GREATER THAN 100.5 F  *CHILLS WITH OR WITHOUT FEVER  NAUSEA AND VOMITING THAT IS NOT CONTROLLED WITH YOUR NAUSEA MEDICATION  *UNUSUAL SHORTNESS OF BREATH  *UNUSUAL BRUISING OR BLEEDING  TENDERNESS IN MOUTH AND THROAT WITH OR WITHOUT PRESENCE OF ULCERS  *URINARY PROBLEMS  *BOWEL PROBLEMS  UNUSUAL RASH Items with * indicate a potential emergency and should be followed up as soon as possible.  Feel free to call the clinic you have any questions or concerns. The clinic phone number is [phone].

## 2014-10-11 NOTE — Progress Notes (Signed)
Discharged with spouse, ambulatory in no distress.

## 2014-10-11 NOTE — Progress Notes (Signed)
  [facility] OFFICE PROGRESS NOTE   Diagnosis:  Colon cancer  INTERVAL HISTORY:   Mr. Berling returns as scheduled. He completed cycle 5 FOLFOX on 09/27/2014. He denies nausea/vomiting. No mouth sores. No diarrhea. Cold sensitivity lasted 4 days. No persistent neuropathy symptoms. Stable "arthritis pain". No episodes of dyspnea. No fever or cough.  Objective:  Vital signs in last 24 hours:  Blood pressure 92/62, pulse 100, temperature 97.7 F (36.5 C), temperature source Oral, resp. rate 19, height $RemoveBe'5\' 9"'fJJSBdTcI$  (1.753 m), weight 182 lb 9.6 oz (82.827 kg), SpO2 93.00%.    HEENT: No thrush or ulcers. Resp: Lungs clear bilaterally. Cardio: Regular rate and rhythm. GI: Abdomen soft and nontender. No hepatomegaly. Vascular: No leg edema. Neuro: Vibratory sense intact over the fingertips per tuning fork exam.  Skin: No rash. Port-A-Cath without erythema.    Lab Results:  Lab Results  Component Value Date   WBC 4.0 10/11/2014   HGB 11.8* 10/11/2014   HCT 34.9* 10/11/2014   MCV 99.4* 10/11/2014   PLT 101* 10/11/2014   NEUTROABS 1.8 10/11/2014    Imaging:  No results found.  Medications: I have reviewed the patient's current medications.  Assessment/Plan: 1. 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  Microsatellite instability-High, loss of MLH1 and equivocal loss of PMS2 expression; BRAF mutation detected.  Markedly elevated preoperative CEA.  Cycle 1 FOLFOX 08/02/2014.  Cycle 2 FOLFOX 08/16/2014.  Cycle 3 FOLFOX 08/30/2014.  Cycle 4 FOLFOX 09/13/2014 Cycle 5 FOLFOX 09/27/2014 2. History of tobacco use/CT evidence of COPD  3. History of peptic ulcer disease  4. History of colon polyps, multiple polyps in the 06/30/2014 resection specimen  5. Chronic back pain/arthritis   Disposition: He appears stable. He has completed 5 cycles of FOLFOX. Plan to proceed with cycle 6 today  as scheduled. He will return for a followup visit and cycle 7 in 2 weeks.  He is mildly hypotensive today. Question etiology. He appears asymptomatic. We will obtain a repeat blood pressure later today.  Plan reviewed with Dr. Benay Spice.    Ned Card ANP/GNP-BC   10/11/2014  9:36 AM

## 2014-10-11 NOTE — Telephone Encounter (Signed)
Gave AVS & Cal for Nov. °

## 2014-10-13 ENCOUNTER — Ambulatory Visit (HOSPITAL_BASED_OUTPATIENT_CLINIC_OR_DEPARTMENT_OTHER): Payer: Medicare Other

## 2014-10-13 DIAGNOSIS — Z452 Encounter for adjustment and management of vascular access device: Secondary | ICD-10-CM

## 2014-10-13 DIAGNOSIS — C189 Malignant neoplasm of colon, unspecified: Secondary | ICD-10-CM

## 2014-10-13 DIAGNOSIS — C180 Malignant neoplasm of cecum: Secondary | ICD-10-CM

## 2014-10-13 MED ORDER — HEPARIN SOD (PORK) LOCK FLUSH 100 UNIT/ML IV SOLN
500.0000 [IU] | Freq: Once | INTRAVENOUS | Status: AC | PRN
  Administered 2014-10-13: 500 [IU]
  Filled 2014-10-13: qty 5

## 2014-10-13 MED ORDER — SODIUM CHLORIDE 0.9 % IJ SOLN
10.0000 mL | INTRAMUSCULAR | Status: DC | PRN
  Administered 2014-10-13: 10 mL
  Filled 2014-10-13: qty 10

## 2014-10-22 ENCOUNTER — Other Ambulatory Visit: Payer: Self-pay | Admitting: Oncology

## 2014-10-25 ENCOUNTER — Ambulatory Visit (HOSPITAL_BASED_OUTPATIENT_CLINIC_OR_DEPARTMENT_OTHER): Payer: Medicare Other | Admitting: Oncology

## 2014-10-25 ENCOUNTER — Telehealth: Payer: Self-pay | Admitting: Oncology

## 2014-10-25 ENCOUNTER — Other Ambulatory Visit (HOSPITAL_BASED_OUTPATIENT_CLINIC_OR_DEPARTMENT_OTHER): Payer: Medicare Other

## 2014-10-25 ENCOUNTER — Ambulatory Visit (HOSPITAL_BASED_OUTPATIENT_CLINIC_OR_DEPARTMENT_OTHER): Payer: Medicare Other

## 2014-10-25 VITALS — BP 107/64 | HR 110 | Temp 97.1°F | Resp 18 | Ht 69.0 in | Wt 183.1 lb

## 2014-10-25 DIAGNOSIS — Z5111 Encounter for antineoplastic chemotherapy: Secondary | ICD-10-CM

## 2014-10-25 DIAGNOSIS — C189 Malignant neoplasm of colon, unspecified: Secondary | ICD-10-CM

## 2014-10-25 DIAGNOSIS — C180 Malignant neoplasm of cecum: Secondary | ICD-10-CM

## 2014-10-25 DIAGNOSIS — G8929 Other chronic pain: Secondary | ICD-10-CM | POA: Diagnosis not present

## 2014-10-25 LAB — CBC WITH DIFFERENTIAL/PLATELET
BASO%: 0.5 % (ref 0.0–2.0)
Basophils Absolute: 0 10*3/uL (ref 0.0–0.1)
EOS ABS: 0 10*3/uL (ref 0.0–0.5)
EOS%: 1 % (ref 0.0–7.0)
HCT: 35.8 % — ABNORMAL LOW (ref 38.4–49.9)
HGB: 11.9 g/dL — ABNORMAL LOW (ref 13.0–17.1)
LYMPH%: 31.6 % (ref 14.0–49.0)
MCH: 34.4 pg — ABNORMAL HIGH (ref 27.2–33.4)
MCHC: 33.3 g/dL (ref 32.0–36.0)
MCV: 103.2 fL — AB (ref 79.3–98.0)
MONO#: 0.7 10*3/uL (ref 0.1–0.9)
MONO%: 16.8 % — AB (ref 0.0–14.0)
NEUT#: 2 10*3/uL (ref 1.5–6.5)
NEUT%: 50.1 % (ref 39.0–75.0)
Platelets: 102 10*3/uL — ABNORMAL LOW (ref 140–400)
RBC: 3.47 10*6/uL — AB (ref 4.20–5.82)
RDW: 20.5 % — AB (ref 11.0–14.6)
WBC: 4.1 10*3/uL (ref 4.0–10.3)
lymph#: 1.3 10*3/uL (ref 0.9–3.3)

## 2014-10-25 LAB — COMPREHENSIVE METABOLIC PANEL (CC13)
ALBUMIN: 3.4 g/dL — AB (ref 3.5–5.0)
ALT: 37 U/L (ref 0–55)
AST: 36 U/L — AB (ref 5–34)
Alkaline Phosphatase: 114 U/L (ref 40–150)
Anion Gap: 7 mEq/L (ref 3–11)
BILIRUBIN TOTAL: 0.46 mg/dL (ref 0.20–1.20)
BUN: 16.7 mg/dL (ref 7.0–26.0)
CO2: 24 mEq/L (ref 22–29)
Calcium: 9.2 mg/dL (ref 8.4–10.4)
Chloride: 105 mEq/L (ref 98–109)
Creatinine: 1.2 mg/dL (ref 0.7–1.3)
GLUCOSE: 165 mg/dL — AB (ref 70–140)
POTASSIUM: 4.7 meq/L (ref 3.5–5.1)
SODIUM: 136 meq/L (ref 136–145)
Total Protein: 6.8 g/dL (ref 6.4–8.3)

## 2014-10-25 MED ORDER — DEXTROSE 5 % IV SOLN
400.0000 mg/m2 | Freq: Once | INTRAVENOUS | Status: AC
  Administered 2014-10-25: 800 mg via INTRAVENOUS
  Filled 2014-10-25: qty 40

## 2014-10-25 MED ORDER — HEPARIN SOD (PORK) LOCK FLUSH 100 UNIT/ML IV SOLN
500.0000 [IU] | Freq: Once | INTRAVENOUS | Status: DC | PRN
  Filled 2014-10-25: qty 5

## 2014-10-25 MED ORDER — DEXTROSE 5 % IV SOLN
Freq: Once | INTRAVENOUS | Status: AC
  Administered 2014-10-25: 10:00:00 via INTRAVENOUS

## 2014-10-25 MED ORDER — ONDANSETRON 8 MG/NS 50 ML IVPB
INTRAVENOUS | Status: AC
  Filled 2014-10-25: qty 8

## 2014-10-25 MED ORDER — ONDANSETRON 8 MG/50ML IVPB (CHCC)
8.0000 mg | Freq: Once | INTRAVENOUS | Status: AC
  Administered 2014-10-25: 8 mg via INTRAVENOUS

## 2014-10-25 MED ORDER — DEXAMETHASONE SODIUM PHOSPHATE 10 MG/ML IJ SOLN
10.0000 mg | Freq: Once | INTRAMUSCULAR | Status: AC
  Administered 2014-10-25: 10 mg via INTRAVENOUS

## 2014-10-25 MED ORDER — FLUOROURACIL CHEMO INJECTION 2.5 GM/50ML
400.0000 mg/m2 | Freq: Once | INTRAVENOUS | Status: AC
  Administered 2014-10-25: 800 mg via INTRAVENOUS
  Filled 2014-10-25: qty 16

## 2014-10-25 MED ORDER — DEXAMETHASONE SODIUM PHOSPHATE 10 MG/ML IJ SOLN
INTRAMUSCULAR | Status: AC
  Filled 2014-10-25: qty 1

## 2014-10-25 MED ORDER — SODIUM CHLORIDE 0.9 % IJ SOLN
10.0000 mL | INTRAMUSCULAR | Status: DC | PRN
  Filled 2014-10-25: qty 10

## 2014-10-25 MED ORDER — OXALIPLATIN CHEMO INJECTION 100 MG/20ML
85.0000 mg/m2 | Freq: Once | INTRAVENOUS | Status: AC
  Administered 2014-10-25: 170 mg via INTRAVENOUS
  Filled 2014-10-25: qty 34

## 2014-10-25 MED ORDER — FLUOROURACIL CHEMO INJECTION 5 GM/100ML
2400.0000 mg/m2 | INTRAVENOUS | Status: DC
  Administered 2014-10-25: 4750 mg via INTRAVENOUS
  Filled 2014-10-25: qty 95

## 2014-10-25 NOTE — Telephone Encounter (Signed)
gv adn printed appt sched and avs for pt for NOV adn DEC...sed added tx.

## 2014-10-25 NOTE — Progress Notes (Signed)
  [facility] OFFICE PROGRESS NOTE   Diagnosis: colon cancer  INTERVAL HISTORY:   He returns as scheduled. He completed another cycle of FOLFOX 10/11/2014. No nausea, mouth sores, or diarrhea. He reports cold sensitivity lasting for 2-3 days after the pump disconnect. No neuropathy symptoms at present.  Objective:  Vital signs in last 24 hours:  Blood pressure 107/64, pulse 110, temperature 97.1 F (36.2 C), temperature source Oral, resp. rate 18, height $RemoveBe'5\' 9"'kceabZpEg$  (1.753 m), weight 183 lb 1.6 oz (83.054 kg).    HEENT: no thrush or ulcers Resp: lungs clear bilaterally Cardio: regular rate and rhythm GI: no hepatomegaly, nontender Vascular: no leg edema Neuro:the vibratory sense is intact to the fingertips bilaterally  Skin:Palms without erythema   Portacath/PICC-without erythema  Lab Results:  Lab Results  Component Value Date   WBC 4.1 10/25/2014   HGB 11.9* 10/25/2014   HCT 35.8* 10/25/2014   MCV 103.2* 10/25/2014   PLT 102* 10/25/2014   NEUTROABS 2.0 10/25/2014     Medications: I have reviewed the patient's current medications.  Assessment/Plan: 1. 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  Microsatellite instability-High, loss of MLH1 and equivocal loss of PMS2 expression; BRAF mutation detected.   Markedly elevated preoperative CEA.   Cycle 1 FOLFOX 08/02/2014.   Cycle 2 FOLFOX 08/16/2014.   Cycle 3 FOLFOX 08/30/2014.   Cycle 4 FOLFOX 09/13/2014  Cycle 5 FOLFOX 09/27/2014  Cycle 6 FOLFOX 10/11/2014. 2. History of tobacco use/CT evidence of COPD  3. History of peptic ulcer disease  4. History of colon polyps, multiple polyps in the 06/30/2014 resection specimen  5. Chronic back pain/arthritis   Disposition:  Matthew [name] has completed 6 cycles of FOLFOX. He continues to tolerate chemotherapy well. The plan is to proceed with cycle 7 today. He will  return for an office visit and chemotherapy in 2 weeks.  Betsy Coder, MD  10/25/2014  9:10 AM

## 2014-10-25 NOTE — Patient Instructions (Signed)
[facility] Discharge Instructions for Patients Receiving Chemotherapy  Today you received the following chemotherapy agents Oxaliplatin, Leucovorin, 5FU.  To help prevent nausea and vomiting after your treatment, we encourage you to take your nausea medication Compazine 10 mg as ordered.   If you develop nausea and vomiting that is not controlled by your nausea medication, call the clinic.   BELOW ARE SYMPTOMS THAT SHOULD BE REPORTED IMMEDIATELY:  *FEVER GREATER THAN 100.5 F  *CHILLS WITH OR WITHOUT FEVER  NAUSEA AND VOMITING THAT IS NOT CONTROLLED WITH YOUR NAUSEA MEDICATION  *UNUSUAL SHORTNESS OF BREATH  *UNUSUAL BRUISING OR BLEEDING  TENDERNESS IN MOUTH AND THROAT WITH OR WITHOUT PRESENCE OF ULCERS  *URINARY PROBLEMS  *BOWEL PROBLEMS  UNUSUAL RASH Items with * indicate a potential emergency and should be followed up as soon as possible.  Feel free to call the clinic you have any questions or concerns. The clinic phone number is [phone].

## 2014-10-25 NOTE — Progress Notes (Signed)
Patient knows to return at 11:15am on Friday to have his pump discontinued.

## 2014-10-27 ENCOUNTER — Ambulatory Visit (HOSPITAL_BASED_OUTPATIENT_CLINIC_OR_DEPARTMENT_OTHER): Payer: Medicare Other

## 2014-10-27 DIAGNOSIS — C189 Malignant neoplasm of colon, unspecified: Secondary | ICD-10-CM

## 2014-10-27 DIAGNOSIS — C180 Malignant neoplasm of cecum: Secondary | ICD-10-CM

## 2014-10-27 MED ORDER — HEPARIN SOD (PORK) LOCK FLUSH 100 UNIT/ML IV SOLN
500.0000 [IU] | Freq: Once | INTRAVENOUS | Status: AC | PRN
  Administered 2014-10-27: 500 [IU]
  Filled 2014-10-27: qty 5

## 2014-10-27 MED ORDER — SODIUM CHLORIDE 0.9 % IJ SOLN
10.0000 mL | INTRAMUSCULAR | Status: DC | PRN
Start: 2014-10-27 — End: 2014-10-27
  Administered 2014-10-27: 10 mL
  Filled 2014-10-27: qty 10

## 2014-11-05 ENCOUNTER — Other Ambulatory Visit: Payer: Self-pay | Admitting: Oncology

## 2014-11-08 ENCOUNTER — Ambulatory Visit (HOSPITAL_BASED_OUTPATIENT_CLINIC_OR_DEPARTMENT_OTHER): Payer: Medicare Other | Admitting: Oncology

## 2014-11-08 ENCOUNTER — Other Ambulatory Visit (HOSPITAL_BASED_OUTPATIENT_CLINIC_OR_DEPARTMENT_OTHER): Payer: Medicare Other

## 2014-11-08 ENCOUNTER — Telehealth: Payer: Self-pay | Admitting: Oncology

## 2014-11-08 ENCOUNTER — Ambulatory Visit (HOSPITAL_BASED_OUTPATIENT_CLINIC_OR_DEPARTMENT_OTHER): Payer: Medicare Other

## 2014-11-08 ENCOUNTER — Telehealth: Payer: Self-pay | Admitting: *Deleted

## 2014-11-08 VITALS — BP 102/69 | HR 104 | Temp 97.5°F | Resp 18 | Ht 69.0 in | Wt 183.9 lb

## 2014-11-08 DIAGNOSIS — C180 Malignant neoplasm of cecum: Secondary | ICD-10-CM

## 2014-11-08 DIAGNOSIS — D696 Thrombocytopenia, unspecified: Secondary | ICD-10-CM | POA: Diagnosis not present

## 2014-11-08 DIAGNOSIS — C189 Malignant neoplasm of colon, unspecified: Secondary | ICD-10-CM

## 2014-11-08 DIAGNOSIS — Z5111 Encounter for antineoplastic chemotherapy: Secondary | ICD-10-CM

## 2014-11-08 LAB — COMPREHENSIVE METABOLIC PANEL (CC13)
ALK PHOS: 134 U/L (ref 40–150)
ALT: 42 U/L (ref 0–55)
ANION GAP: 9 meq/L (ref 3–11)
AST: 38 U/L — AB (ref 5–34)
Albumin: 3.3 g/dL — ABNORMAL LOW (ref 3.5–5.0)
BILIRUBIN TOTAL: 0.48 mg/dL (ref 0.20–1.20)
BUN: 15.7 mg/dL (ref 7.0–26.0)
CO2: 24 meq/L (ref 22–29)
Calcium: 9.3 mg/dL (ref 8.4–10.4)
Chloride: 104 mEq/L (ref 98–109)
Creatinine: 1.2 mg/dL (ref 0.7–1.3)
Glucose: 147 mg/dl — ABNORMAL HIGH (ref 70–140)
Potassium: 4.8 mEq/L (ref 3.5–5.1)
SODIUM: 137 meq/L (ref 136–145)
Total Protein: 6.6 g/dL (ref 6.4–8.3)

## 2014-11-08 LAB — CBC WITH DIFFERENTIAL/PLATELET
BASO%: 0.4 % (ref 0.0–2.0)
Basophils Absolute: 0 10*3/uL (ref 0.0–0.1)
EOS%: 1.2 % (ref 0.0–7.0)
Eosinophils Absolute: 0.1 10*3/uL (ref 0.0–0.5)
HCT: 36.1 % — ABNORMAL LOW (ref 38.4–49.9)
HGB: 12 g/dL — ABNORMAL LOW (ref 13.0–17.1)
LYMPH%: 31.2 % (ref 14.0–49.0)
MCH: 35.1 pg — AB (ref 27.2–33.4)
MCHC: 33.1 g/dL (ref 32.0–36.0)
MCV: 106.2 fL — ABNORMAL HIGH (ref 79.3–98.0)
MONO#: 0.8 10*3/uL (ref 0.1–0.9)
MONO%: 17.3 % — ABNORMAL HIGH (ref 0.0–14.0)
NEUT#: 2.4 10*3/uL (ref 1.5–6.5)
NEUT%: 49.9 % (ref 39.0–75.0)
Platelets: 92 10*3/uL — ABNORMAL LOW (ref 140–400)
RBC: 3.4 10*6/uL — AB (ref 4.20–5.82)
RDW: 20.3 % — ABNORMAL HIGH (ref 11.0–14.6)
WBC: 4.8 10*3/uL (ref 4.0–10.3)
lymph#: 1.5 10*3/uL (ref 0.9–3.3)

## 2014-11-08 MED ORDER — ONDANSETRON 8 MG/50ML IVPB (CHCC)
8.0000 mg | Freq: Once | INTRAVENOUS | Status: AC
  Administered 2014-11-08: 8 mg via INTRAVENOUS

## 2014-11-08 MED ORDER — DEXTROSE 5 % IV SOLN
Freq: Once | INTRAVENOUS | Status: AC
  Administered 2014-11-08: 12:00:00 via INTRAVENOUS

## 2014-11-08 MED ORDER — DEXAMETHASONE SODIUM PHOSPHATE 10 MG/ML IJ SOLN
INTRAMUSCULAR | Status: AC
  Filled 2014-11-08: qty 1

## 2014-11-08 MED ORDER — ONDANSETRON 8 MG/NS 50 ML IVPB
INTRAVENOUS | Status: AC
  Filled 2014-11-08: qty 8

## 2014-11-08 MED ORDER — SODIUM CHLORIDE 0.9 % IV SOLN
2400.0000 mg/m2 | INTRAVENOUS | Status: DC
  Administered 2014-11-08: 4750 mg via INTRAVENOUS
  Filled 2014-11-08: qty 95

## 2014-11-08 MED ORDER — FLUOROURACIL CHEMO INJECTION 2.5 GM/50ML
400.0000 mg/m2 | Freq: Once | INTRAVENOUS | Status: AC
  Administered 2014-11-08: 800 mg via INTRAVENOUS
  Filled 2014-11-08: qty 16

## 2014-11-08 MED ORDER — DEXAMETHASONE SODIUM PHOSPHATE 10 MG/ML IJ SOLN
10.0000 mg | Freq: Once | INTRAMUSCULAR | Status: AC
  Administered 2014-11-08: 10 mg via INTRAVENOUS

## 2014-11-08 MED ORDER — OXALIPLATIN CHEMO INJECTION 100 MG/20ML
85.0000 mg/m2 | Freq: Once | INTRAVENOUS | Status: AC
  Administered 2014-11-08: 170 mg via INTRAVENOUS
  Filled 2014-11-08: qty 34

## 2014-11-08 MED ORDER — LEUCOVORIN CALCIUM INJECTION 350 MG
400.0000 mg/m2 | Freq: Once | INTRAVENOUS | Status: AC
  Administered 2014-11-08: 800 mg via INTRAVENOUS
  Filled 2014-11-08: qty 40

## 2014-11-08 NOTE — Patient Instructions (Signed)
[facility] Discharge Instructions for Patients Receiving Chemotherapy  Today you received the following chemotherapy agents leucovorin, oxaliplatin, 5FU push, 5FU pump  To help prevent nausea and vomiting after your treatment, we encourage you to take your nausea medication starting about 6pm if needed.   If you develop nausea and vomiting that is not controlled by your nausea medication, call the clinic.   BELOW ARE SYMPTOMS THAT SHOULD BE REPORTED IMMEDIATELY:  *FEVER GREATER THAN 100.5 F  *CHILLS WITH OR WITHOUT FEVER  NAUSEA AND VOMITING THAT IS NOT CONTROLLED WITH YOUR NAUSEA MEDICATION  *UNUSUAL SHORTNESS OF BREATH  *UNUSUAL BRUISING OR BLEEDING  TENDERNESS IN MOUTH AND THROAT WITH OR WITHOUT PRESENCE OF ULCERS  *URINARY PROBLEMS  *BOWEL PROBLEMS  UNUSUAL RASH Items with * indicate a potential emergency and should be followed up as soon as possible.  Feel free to call the clinic you have any questions or concerns. The clinic phone number is [phone].

## 2014-11-08 NOTE — Progress Notes (Signed)
  [facility] OFFICE PROGRESS NOTE   Diagnosis: Colon cancer  INTERVAL HISTORY:   He returns as scheduled. He completed another cycle of FOLFOX 10/25/2014. He reports increased malaise following chemotherapy. Cold sensitivity lasted for 5 days. No neuropathy symptoms at present. No nausea or diarrhea.  Objective:  Vital signs in last 24 hours:  Blood pressure 102/69, pulse 104, temperature 97.5 F (36.4 C), temperature source Oral, resp. rate 18, height $RemoveBe'5\' 9"'izzaolfpZ$  (1.753 m), weight 183 lb 14.4 oz (83.416 kg), SpO2 96 %.    HEENT: No thrush or ulcers Resp: Lungs clear bilaterally Cardio: Regular rate and rhythm GI: No hepatomegaly, nontender Vascular: No leg edema Neuro: Mild decrease in vibratory sense at the fingertips bilaterally     Portacath/PICC-without erythema  Lab Results:  Lab Results  Component Value Date   WBC 4.8 11/08/2014   HGB 12.0* 11/08/2014   HCT 36.1* 11/08/2014   MCV 106.2* 11/08/2014   PLT 92* 11/08/2014   NEUTROABS 2.4 11/08/2014     Medications: I have reviewed the patient's current medications.  Assessment/Plan: 1. 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  Microsatellite instability-High, loss of MLH1 and equivocal loss of PMS2 expression; BRAF mutation detected.   Markedly elevated preoperative CEA.   Cycle 1 FOLFOX 08/02/2014.   Cycle 2 FOLFOX 08/16/2014.   Cycle 3 FOLFOX 08/30/2014.   Cycle 4 FOLFOX 09/13/2014  Cycle 5 FOLFOX 09/27/2014  Cycle 6 FOLFOX 10/11/2014.  Cycle 7 FOLFOX 10/25/2014 2. History of tobacco use/CT evidence of COPD  3. History of peptic ulcer disease  4. History of colon polyps, multiple polyps in the 06/30/2014 resection specimen  5. Chronic back pain/arthritis 6. 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 Thrombocytopenia secondary to chemotherapy   Disposition:  Mr.  Buresh appears stable. He will complete cycle 8 of adjuvant FOLFOX today. He has mild thrombocytopenia. He will contact us for bruising or bleeding. He has been treated with a similar platelet count for the past few cycles.  He will return for an office visit and chemotherapy in 2 weeks.  Betsy Coder, MD  11/08/2014  11:01 AM

## 2014-11-08 NOTE — Progress Notes (Signed)
MD saw patient today and says "OK to treat with platelet count 92".

## 2014-11-08 NOTE — Telephone Encounter (Signed)
Gave avs & cal forDec. Sent mess to sch tx. °

## 2014-11-08 NOTE — Telephone Encounter (Signed)
Per staff message and POF I have scheduled appts. Advised scheduler of appts. JMW  

## 2014-11-10 ENCOUNTER — Telehealth: Payer: Self-pay | Admitting: Oncology

## 2014-11-10 ENCOUNTER — Ambulatory Visit (HOSPITAL_BASED_OUTPATIENT_CLINIC_OR_DEPARTMENT_OTHER): Payer: Medicare Other

## 2014-11-10 DIAGNOSIS — C180 Malignant neoplasm of cecum: Secondary | ICD-10-CM

## 2014-11-10 DIAGNOSIS — C189 Malignant neoplasm of colon, unspecified: Secondary | ICD-10-CM

## 2014-11-10 MED ORDER — SODIUM CHLORIDE 0.9 % IJ SOLN
10.0000 mL | INTRAMUSCULAR | Status: DC | PRN
  Administered 2014-11-10: 10 mL
  Filled 2014-11-10: qty 10

## 2014-11-10 MED ORDER — HEPARIN SOD (PORK) LOCK FLUSH 100 UNIT/ML IV SOLN
500.0000 [IU] | Freq: Once | INTRAVENOUS | Status: AC | PRN
  Administered 2014-11-10: 500 [IU]
  Filled 2014-11-10: qty 5

## 2014-11-10 NOTE — Patient Instructions (Signed)
Fluorouracil, 5FU; Diclofenac topical cream What is this medicine? FLUOROURACIL; DICLOFENAC (flure oh YOOR a sil; dye KLOE fen ak) is a combination of a topical chemotherapy agent and non-steroidal anti-inflammatory drug (NSAID). It is used on the skin to treat skin cancer and skin conditions that could become cancer. This medicine may be used for other purposes; ask your health care provider or pharmacist if you have questions. COMMON BRAND NAME(S): FLUORAC What should I tell my health care provider before I take this medicine? They need to know if you have any of these conditions: -bleeding problems -cigarette smoker -DPD enzyme deficiency -heart disease -high blood pressure -if you frequently drink alcohol containing drinks -kidney disease -liver disease -open or infected skin -stomach problems -swelling or open sores at the treatment site -recent or planned coronary artery bypass graft (CABG) surgery -an unusual or allergic reaction to fluorouracil, diclofenac, aspirin, other NSAIDs, other medicines, foods, dyes, or preservatives -pregnant or trying to get pregnant -breast-feeding How should I use this medicine? This medicine is only for use on the skin. Follow the directions on the prescription label. Wash hands before and after use. Wash affected area and gently pat dry. To apply this medicine use a cotton-tipped applicator, or use gloves if applying with fingertips. If applied with unprotected fingertips, it is very important to wash your hands well after you apply this medicine. Avoid applying to the eyes, nose, or mouth. Apply enough medicine to cover the affected area. You can cover the area with a light gauze dressing, but do not use tight or air-tight dressings. Finish the full course prescribed by your doctor or health care professional, even if you think your condition is better. Do not stop taking except on the advice of your doctor or health care professional. Talk to your  pediatrician regarding the use of this medicine in children. Special care may be needed. Overdosage: If you think you've taken too much of this medicine contact a poison control center or emergency room at once. Overdosage: If you think you have taken too much of this medicine contact a poison control center or emergency room at once. NOTE: This medicine is only for you. Do not share this medicine with others. What if I miss a dose? If you miss a dose, apply it as soon as you can. If it is almost time for your next dose, only use that dose. Do not apply extra doses. Contact your doctor or health care professional if you miss more than one dose. What may interact with this medicine? Interactions are not expected. Do not use any other skin products without telling your doctor or health care professional. This list may not describe all possible interactions. Give your health care provider a list of all the medicines, herbs, non-prescription drugs, or dietary supplements you use. Also tell them if you smoke, drink alcohol, or use illegal drugs. Some items may interact with your medicine. What should I watch for while using this medicine? Visit your doctor or health care professional for checks on your progress. You will need to use this medicine for 2 to 6 weeks. This may be longer depending on the condition being treated. You may not see full healing for another 1 to 2 months after you stop using the medicine. Treated areas of skin can look unsightly during and for several weeks after treatment with this medicine. This medicine can make you more sensitive to the sun. Keep out of the sun. If you cannot avoid being in   the sun, wear protective clothing and use sunscreen. Do not use sun lamps or tanning beds/booths. Where should I keep my What side effects may I notice from receiving this medicine? Side effects that you should report to your doctor or health care professional as soon as possible: -allergic  reactions like skin rash, itching or hives, swelling of the face, lips, or tongue -black or bloody stools, blood in the urine or vomit -blurred vision -chest pain -difficulty breathing or wheezing -redness, blistering, peeling or loosening of the skin, including inside the mouth -severe redness and swelling of normal skin -slurred speech or weakness on one side of the body -trouble passing urine or change in the amount of urine -unexplained weight gain or swelling -unusually weak or tired -yellowing of eyes or skin Side effects that usually do not require medical attention (Report these to your doctor or health care professional if they continue or are bothersome.): -increased sensitivity of the skin to sun and ultraviolet light -pain and burning of the affected area -scaling or swelling of the affected area -skin rash, itching of the affected area -tenderness This list may not describe all possible side effects. Call your doctor for medical advice about side effects. You may report side effects to FDA at 1-800-FDA-1088. Where should I keep my medicine? Keep out of the reach of children. Store at room temperature between 20 and 25 degrees C (68 and 77 degrees F). Throw away any unused medicine after the expiration date. NOTE: This sheet is a summary. It may not cover all possible information. If you have questions about this medicine, talk to your doctor, pharmacist, or health care provider.  2015, [employer] Standard. (2014-04-03 11:09:58)  

## 2014-11-10 NOTE — Telephone Encounter (Signed)
DUE TO PAL MOVED 12/29 APPT FROM LT TO BS. S/W PT HE IS AWARE AND WILL GET NEW SCHEDULE 12/9.

## 2014-11-19 ENCOUNTER — Other Ambulatory Visit: Payer: Self-pay | Admitting: Oncology

## 2014-11-22 ENCOUNTER — Telehealth: Payer: Self-pay | Admitting: Oncology

## 2014-11-22 ENCOUNTER — Other Ambulatory Visit (HOSPITAL_BASED_OUTPATIENT_CLINIC_OR_DEPARTMENT_OTHER): Payer: Medicare Other

## 2014-11-22 ENCOUNTER — Ambulatory Visit (HOSPITAL_BASED_OUTPATIENT_CLINIC_OR_DEPARTMENT_OTHER): Payer: Medicare Other | Admitting: Nurse Practitioner

## 2014-11-22 ENCOUNTER — Ambulatory Visit (HOSPITAL_BASED_OUTPATIENT_CLINIC_OR_DEPARTMENT_OTHER): Payer: Medicare Other

## 2014-11-22 VITALS — BP 109/64 | HR 105 | Temp 97.5°F | Resp 18 | Ht 69.0 in | Wt 184.5 lb

## 2014-11-22 DIAGNOSIS — C180 Malignant neoplasm of cecum: Secondary | ICD-10-CM

## 2014-11-22 DIAGNOSIS — Z5111 Encounter for antineoplastic chemotherapy: Secondary | ICD-10-CM | POA: Diagnosis not present

## 2014-11-22 DIAGNOSIS — C189 Malignant neoplasm of colon, unspecified: Secondary | ICD-10-CM

## 2014-11-22 LAB — CBC WITH DIFFERENTIAL/PLATELET
BASO%: 0.5 % (ref 0.0–2.0)
Basophils Absolute: 0 10*3/uL (ref 0.0–0.1)
EOS%: 1.4 % (ref 0.0–7.0)
Eosinophils Absolute: 0.1 10*3/uL (ref 0.0–0.5)
HEMATOCRIT: 35.6 % — AB (ref 38.4–49.9)
HGB: 11.8 g/dL — ABNORMAL LOW (ref 13.0–17.1)
LYMPH#: 1.2 10*3/uL (ref 0.9–3.3)
LYMPH%: 30.7 % (ref 14.0–49.0)
MCH: 36 pg — AB (ref 27.2–33.4)
MCHC: 33.1 g/dL (ref 32.0–36.0)
MCV: 108.8 fL — ABNORMAL HIGH (ref 79.3–98.0)
MONO#: 0.7 10*3/uL (ref 0.1–0.9)
MONO%: 18.4 % — ABNORMAL HIGH (ref 0.0–14.0)
NEUT#: 2 10*3/uL (ref 1.5–6.5)
NEUT%: 49 % (ref 39.0–75.0)
Platelets: 105 10*3/uL — ABNORMAL LOW (ref 140–400)
RBC: 3.27 10*6/uL — AB (ref 4.20–5.82)
RDW: 20 % — AB (ref 11.0–14.6)
WBC: 4 10*3/uL (ref 4.0–10.3)

## 2014-11-22 LAB — COMPREHENSIVE METABOLIC PANEL (CC13)
ALT: 40 U/L (ref 0–55)
AST: 40 U/L — AB (ref 5–34)
Albumin: 3.3 g/dL — ABNORMAL LOW (ref 3.5–5.0)
Alkaline Phosphatase: 141 U/L (ref 40–150)
Anion Gap: 11 mEq/L (ref 3–11)
BUN: 11.1 mg/dL (ref 7.0–26.0)
CALCIUM: 9.3 mg/dL (ref 8.4–10.4)
CHLORIDE: 106 meq/L (ref 98–109)
CO2: 21 mEq/L — ABNORMAL LOW (ref 22–29)
CREATININE: 1.1 mg/dL (ref 0.7–1.3)
EGFR: 72 mL/min/{1.73_m2} — ABNORMAL LOW (ref >90)
Glucose: 145 mg/dl — ABNORMAL HIGH (ref 70–140)
Potassium: 4.7 mEq/L (ref 3.5–5.1)
SODIUM: 137 meq/L (ref 136–145)
Total Bilirubin: 0.55 mg/dL (ref 0.20–1.20)
Total Protein: 6.8 g/dL (ref 6.4–8.3)

## 2014-11-22 MED ORDER — ONDANSETRON 8 MG/NS 50 ML IVPB
INTRAVENOUS | Status: AC
  Filled 2014-11-22: qty 8

## 2014-11-22 MED ORDER — DEXTROSE 5 % IV SOLN
Freq: Once | INTRAVENOUS | Status: AC
  Administered 2014-11-22: 12:00:00 via INTRAVENOUS

## 2014-11-22 MED ORDER — SODIUM CHLORIDE 0.9 % IJ SOLN
10.0000 mL | INTRAMUSCULAR | Status: DC | PRN
  Administered 2014-11-22: 10 mL
  Filled 2014-11-22: qty 10

## 2014-11-22 MED ORDER — SODIUM CHLORIDE 0.9 % IV SOLN
2400.0000 mg/m2 | INTRAVENOUS | Status: DC
  Administered 2014-11-22: 4750 mg via INTRAVENOUS
  Filled 2014-11-22: qty 95

## 2014-11-22 MED ORDER — DEXTROSE 5 % IV SOLN
400.0000 mg/m2 | Freq: Once | INTRAVENOUS | Status: AC
  Administered 2014-11-22: 800 mg via INTRAVENOUS
  Filled 2014-11-22: qty 40

## 2014-11-22 MED ORDER — HEPARIN SOD (PORK) LOCK FLUSH 100 UNIT/ML IV SOLN
500.0000 [IU] | Freq: Once | INTRAVENOUS | Status: DC | PRN
  Filled 2014-11-22: qty 5

## 2014-11-22 MED ORDER — DEXAMETHASONE SODIUM PHOSPHATE 10 MG/ML IJ SOLN
INTRAMUSCULAR | Status: AC
  Filled 2014-11-22: qty 1

## 2014-11-22 MED ORDER — DEXAMETHASONE SODIUM PHOSPHATE 10 MG/ML IJ SOLN
10.0000 mg | Freq: Once | INTRAMUSCULAR | Status: AC
  Administered 2014-11-22: 10 mg via INTRAVENOUS

## 2014-11-22 MED ORDER — FLUOROURACIL CHEMO INJECTION 2.5 GM/50ML
400.0000 mg/m2 | Freq: Once | INTRAVENOUS | Status: AC
  Administered 2014-11-22: 800 mg via INTRAVENOUS
  Filled 2014-11-22: qty 16

## 2014-11-22 MED ORDER — OXALIPLATIN CHEMO INJECTION 100 MG/20ML
85.0000 mg/m2 | Freq: Once | INTRAVENOUS | Status: AC
  Administered 2014-11-22: 170 mg via INTRAVENOUS
  Filled 2014-11-22: qty 34

## 2014-11-22 MED ORDER — ONDANSETRON 8 MG/50ML IVPB (CHCC)
8.0000 mg | Freq: Once | INTRAVENOUS | Status: AC
  Administered 2014-11-22: 8 mg via INTRAVENOUS

## 2014-11-22 NOTE — Progress Notes (Signed)
  [place] OFFICE PROGRESS NOTE   Diagnosis:  Colon cancer  INTERVAL HISTORY:   Matthew [name] returns as scheduled. He completed cycle 8 FOLFOX on 11/08/2014. He had mild nausea. No vomiting. No mouth sores. No diarrhea. The cold sensitivity lasted 8-9 days. He denies persistent neuropathy symptoms.  Objective:  Vital signs in last 24 hours:  Blood pressure 109/64, pulse 105, temperature 97.5 F (36.4 C), temperature source Oral, resp. rate 18, height _0  (1.753 m), weight 184 lb 8 oz (83.689 kg).    HEENT: no thrush or ulcers. Resp: lungs clear bilaterally. Cardio: regular rate and rhythm. GI: abdomen soft and nontender. No hepatomegaly. Vascular: no leg edema. Calves soft and nontender. Neuro: vibratory sense is moderately decreased over the fingertips per tuning fork exam.  Skin: no rash. Port-A-Cath without erythema.    Lab Results:  Lab Results  Component Value Date   WBC 4.0 11/22/2014   HGB 11.8* 11/22/2014   HCT 35.6* 11/22/2014   MCV 108.8* 11/22/2014   PLT 105* 11/22/2014   NEUTROABS 2.0 11/22/2014    Imaging:  No results found.  Medications: I have reviewed the patient's current medications.  Assessment/Plan: 1. 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  Microsatellite instability-High, loss of MLH1 and equivocal loss of PMS2 expression; BRAF mutation detected.   Markedly elevated preoperative CEA.   Cycle 1 FOLFOX 08/02/2014.   Cycle 2 FOLFOX 08/16/2014.   Cycle 3 FOLFOX 08/30/2014.   Cycle 4 FOLFOX 09/13/2014  Cycle 5 FOLFOX 09/27/2014  Cycle 6 FOLFOX 10/11/2014.  Cycle 7 FOLFOX 10/25/2014  Cycle 8 FOLFOX 11/08/2014 2. History of tobacco use/CT evidence of COPD  3. History of peptic ulcer disease  4. History of colon polyps, multiple polyps in the 06/30/2014 resection specimen  5. Chronic back pain/arthritis 6. 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 Thrombocytopenia secondary to chemotherapy   Disposition: Matthew [name] appears stable. He has completed 8 cycles of adjuvant FOLFOX. Plan to proceed with cycle 9 today as scheduled.   He has stable mild thrombocytopenia. He understands to contact the office with any bruising or bleeding.  He will return for a followup visit and cycle 10 FOLFOX in 2 weeks. He will contact the office in the interim as outlined above or with any other problems.  Plan reviewed with Dr. Benay Spice.    Matthew [name] ANP/GNP-BC   11/22/2014  11:37 AM

## 2014-11-22 NOTE — Telephone Encounter (Signed)
gv adn printed appt sched and avs for pt for Dec and Jan 2016....sed added tx. °

## 2014-11-22 NOTE — Patient Instructions (Signed)
RETURN TO CLINIC ON Friday 12/11 AT   [facility] Discharge Instructions for Patients Receiving Chemotherapy  Today you received the following chemotherapy agents:  Oxaliplatin, leucovorin, 58fu   To help prevent nausea and vomiting after your treatment, we encourage you to take your nausea medication.  Take it as often as prescribed.     If you develop nausea and vomiting that is not controlled by your nausea medication, call the clinic. If it is after clinic hours your family physician or the after hours number for the clinic or go to the Emergency Department.   BELOW ARE SYMPTOMS THAT SHOULD BE REPORTED IMMEDIATELY:  *FEVER GREATER THAN 100.5 F  *CHILLS WITH OR WITHOUT FEVER  NAUSEA AND VOMITING THAT IS NOT CONTROLLED WITH YOUR NAUSEA MEDICATION  *UNUSUAL SHORTNESS OF BREATH  *UNUSUAL BRUISING OR BLEEDING  TENDERNESS IN MOUTH AND THROAT WITH OR WITHOUT PRESENCE OF ULCERS  *URINARY PROBLEMS  *BOWEL PROBLEMS  UNUSUAL RASH Items with * indicate a potential emergency and should be followed up as soon as possible.  Feel free to call the clinic you have any questions or concerns. The clinic phone number is [phone].   I have been informed and understand all the instructions given to me. I know to contact the clinic, my physician, or go to the Emergency Department if any problems should occur. I do not have any questions at this time, but understand that I may call the clinic during office hours   should I have any questions or need assistance in obtaining follow up care.    __________________________________________  _____________  __________ Signature of Patient or Authorized Representative            Date                   Time    __________________________________________ Nurse's Signature

## 2014-11-24 ENCOUNTER — Ambulatory Visit (HOSPITAL_BASED_OUTPATIENT_CLINIC_OR_DEPARTMENT_OTHER): Payer: Medicare Other

## 2014-11-24 DIAGNOSIS — C189 Malignant neoplasm of colon, unspecified: Secondary | ICD-10-CM

## 2014-11-24 DIAGNOSIS — C180 Malignant neoplasm of cecum: Secondary | ICD-10-CM | POA: Diagnosis not present

## 2014-11-24 MED ORDER — HEPARIN SOD (PORK) LOCK FLUSH 100 UNIT/ML IV SOLN
500.0000 [IU] | Freq: Once | INTRAVENOUS | Status: AC | PRN
  Administered 2014-11-24: 500 [IU]
  Filled 2014-11-24: qty 5

## 2014-11-24 MED ORDER — SODIUM CHLORIDE 0.9 % IJ SOLN
10.0000 mL | INTRAMUSCULAR | Status: DC | PRN
  Administered 2014-11-24: 10 mL
  Filled 2014-11-24: qty 10

## 2014-12-12 ENCOUNTER — Ambulatory Visit (HOSPITAL_BASED_OUTPATIENT_CLINIC_OR_DEPARTMENT_OTHER): Payer: Medicare Other | Admitting: Oncology

## 2014-12-12 ENCOUNTER — Ambulatory Visit (HOSPITAL_BASED_OUTPATIENT_CLINIC_OR_DEPARTMENT_OTHER): Payer: Medicare Other

## 2014-12-12 ENCOUNTER — Other Ambulatory Visit (HOSPITAL_BASED_OUTPATIENT_CLINIC_OR_DEPARTMENT_OTHER): Payer: Medicare Other

## 2014-12-12 VITALS — BP 108/69 | HR 102 | Temp 97.8°F | Resp 18 | Ht 69.0 in | Wt 182.4 lb

## 2014-12-12 DIAGNOSIS — G620 Drug-induced polyneuropathy: Secondary | ICD-10-CM

## 2014-12-12 DIAGNOSIS — D6959 Other secondary thrombocytopenia: Secondary | ICD-10-CM | POA: Diagnosis not present

## 2014-12-12 DIAGNOSIS — D701 Agranulocytosis secondary to cancer chemotherapy: Secondary | ICD-10-CM

## 2014-12-12 DIAGNOSIS — C180 Malignant neoplasm of cecum: Secondary | ICD-10-CM | POA: Diagnosis not present

## 2014-12-12 DIAGNOSIS — Z5111 Encounter for antineoplastic chemotherapy: Secondary | ICD-10-CM | POA: Diagnosis not present

## 2014-12-12 DIAGNOSIS — C189 Malignant neoplasm of colon, unspecified: Secondary | ICD-10-CM

## 2014-12-12 LAB — CBC WITH DIFFERENTIAL/PLATELET
BASO%: 0.6 % (ref 0.0–2.0)
Basophils Absolute: 0 10*3/uL (ref 0.0–0.1)
EOS ABS: 0.1 10*3/uL (ref 0.0–0.5)
EOS%: 1.8 % (ref 0.0–7.0)
HCT: 35.4 % — ABNORMAL LOW (ref 38.4–49.9)
HEMOGLOBIN: 11.7 g/dL — AB (ref 13.0–17.1)
LYMPH%: 34.9 % (ref 14.0–49.0)
MCH: 37.7 pg — ABNORMAL HIGH (ref 27.2–33.4)
MCHC: 33.1 g/dL (ref 32.0–36.0)
MCV: 114 fL — ABNORMAL HIGH (ref 79.3–98.0)
MONO#: 1 10*3/uL — ABNORMAL HIGH (ref 0.1–0.9)
MONO%: 27.5 % — AB (ref 0.0–14.0)
NEUT#: 1.2 10*3/uL — ABNORMAL LOW (ref 1.5–6.5)
NEUT%: 35.2 % — ABNORMAL LOW (ref 39.0–75.0)
PLATELETS: 136 10*3/uL — AB (ref 140–400)
RBC: 3.1 10*6/uL — ABNORMAL LOW (ref 4.20–5.82)
RDW: 17.6 % — AB (ref 11.0–14.6)
WBC: 3.5 10*3/uL — ABNORMAL LOW (ref 4.0–10.3)
lymph#: 1.2 10*3/uL (ref 0.9–3.3)

## 2014-12-12 LAB — COMPREHENSIVE METABOLIC PANEL (CC13)
ALBUMIN: 3.1 g/dL — AB (ref 3.5–5.0)
ALT: 33 U/L (ref 0–55)
ANION GAP: 10 meq/L (ref 3–11)
AST: 39 U/L — ABNORMAL HIGH (ref 5–34)
Alkaline Phosphatase: 148 U/L (ref 40–150)
BUN: 13.4 mg/dL (ref 7.0–26.0)
CO2: 24 meq/L (ref 22–29)
Calcium: 8.9 mg/dL (ref 8.4–10.4)
Chloride: 104 mEq/L (ref 98–109)
Creatinine: 1.1 mg/dL (ref 0.7–1.3)
EGFR: 68 mL/min/{1.73_m2} — AB (ref >90)
GLUCOSE: 170 mg/dL — AB (ref 70–140)
Potassium: 4.4 mEq/L (ref 3.5–5.1)
SODIUM: 138 meq/L (ref 136–145)
TOTAL PROTEIN: 6.6 g/dL (ref 6.4–8.3)
Total Bilirubin: 0.48 mg/dL (ref 0.20–1.20)

## 2014-12-12 MED ORDER — DEXAMETHASONE SODIUM PHOSPHATE 10 MG/ML IJ SOLN
INTRAMUSCULAR | Status: AC
  Filled 2014-12-12: qty 1

## 2014-12-12 MED ORDER — LEUCOVORIN CALCIUM INJECTION 350 MG
400.0000 mg/m2 | Freq: Once | INTRAVENOUS | Status: AC
  Administered 2014-12-12: 800 mg via INTRAVENOUS
  Filled 2014-12-12: qty 40

## 2014-12-12 MED ORDER — DEXAMETHASONE SODIUM PHOSPHATE 10 MG/ML IJ SOLN
10.0000 mg | Freq: Once | INTRAMUSCULAR | Status: AC
  Administered 2014-12-12: 10 mg via INTRAVENOUS

## 2014-12-12 MED ORDER — SODIUM CHLORIDE 0.9 % IV SOLN
2400.0000 mg/m2 | INTRAVENOUS | Status: DC
  Administered 2014-12-12: 4750 mg via INTRAVENOUS
  Filled 2014-12-12: qty 95

## 2014-12-12 MED ORDER — DEXTROSE 5 % IV SOLN
Freq: Once | INTRAVENOUS | Status: AC
  Administered 2014-12-12: 11:00:00 via INTRAVENOUS

## 2014-12-12 MED ORDER — ONDANSETRON 8 MG/50ML IVPB (CHCC)
8.0000 mg | Freq: Once | INTRAVENOUS | Status: AC
  Administered 2014-12-12: 8 mg via INTRAVENOUS

## 2014-12-12 MED ORDER — ONDANSETRON 8 MG/NS 50 ML IVPB
INTRAVENOUS | Status: AC
  Filled 2014-12-12: qty 8

## 2014-12-12 MED ORDER — OXALIPLATIN CHEMO INJECTION 100 MG/20ML
85.0000 mg/m2 | Freq: Once | INTRAVENOUS | Status: AC
  Administered 2014-12-12: 170 mg via INTRAVENOUS
  Filled 2014-12-12: qty 34

## 2014-12-12 MED ORDER — FLUOROURACIL CHEMO INJECTION 2.5 GM/50ML
400.0000 mg/m2 | Freq: Once | INTRAVENOUS | Status: AC
  Administered 2014-12-12: 800 mg via INTRAVENOUS
  Filled 2014-12-12: qty 16

## 2014-12-12 NOTE — Patient Instructions (Signed)
[place] Discharge Instructions for Patients Receiving Chemotherapy  Today you received the following chemotherapy agents: Leucovorin, Oxaliplatin, 5FU  To help prevent nausea and vomiting after your treatment, we encourage you to take your nausea medication as prescribed by your physician.   If you develop nausea and vomiting that is not controlled by your nausea medication, call the clinic.   BELOW ARE SYMPTOMS THAT SHOULD BE REPORTED IMMEDIATELY:  *FEVER GREATER THAN 100.5 F  *CHILLS WITH OR WITHOUT FEVER  NAUSEA AND VOMITING THAT IS NOT CONTROLLED WITH YOUR NAUSEA MEDICATION  *UNUSUAL SHORTNESS OF BREATH  *UNUSUAL BRUISING OR BLEEDING  TENDERNESS IN MOUTH AND THROAT WITH OR WITHOUT PRESENCE OF ULCERS  *URINARY PROBLEMS  *BOWEL PROBLEMS  UNUSUAL RASH Items with * indicate a potential emergency and should be followed up as soon as possible.  Feel free to call the clinic you have any questions or concerns. The clinic phone number is [phone].

## 2014-12-12 NOTE — Progress Notes (Signed)
  [place] OFFICE PROGRESS NOTE   Diagnosis: Colon cancer  INTERVAL HISTORY:   He completed another cycle of FOLFOX 11/22/2014. He reports increased malaise. No nausea/vomiting, diarrhea, or neuropathy symptoms.  Objective:  Vital signs in last 24 hours:  Blood pressure 108/69, pulse 102, temperature 97.8 F (36.6 C), temperature source Oral, resp. rate 18, height $RemoveBe'5\' 9"'sBpXhTBPv$  (1.753 m), weight 182 lb 6.4 oz (82.736 kg), SpO2 99 %.    HEENT: No thrush or ulcers Resp: Lungs clear bilaterally Cardio: Regular rate and rhythm GI: No hepatomegaly, nontender Vascular: No leg edema Neuro: Mild-to-moderate decreased in vibratory sense of fingertips bilaterally    Portacath/PICC-without erythema  Lab Results:  Lab Results  Component Value Date   WBC 3.5* 12/12/2014   HGB 11.7* 12/12/2014   HCT 35.4* 12/12/2014   MCV 114.0* 12/12/2014   PLT 136* 12/12/2014   NEUTROABS 1.2* 12/12/2014     Medications: I have reviewed the patient's current medications.  Assessment/Plan: 1. 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  Microsatellite instability-High, loss of MLH1 and equivocal loss of PMS2 expression; BRAF mutation detected.   Markedly elevated preoperative CEA.   Cycle 1 FOLFOX 08/02/2014.   Cycle 2 FOLFOX 08/16/2014.   Cycle 3 FOLFOX 08/30/2014.   Cycle 4 FOLFOX 09/13/2014  Cycle 5 FOLFOX 09/27/2014  Cycle 6 FOLFOX 10/11/2014.  Cycle 7 FOLFOX 10/25/2014  Cycle 8 FOLFOX 11/08/2014  Cycle 9 FOLFOX 11/22/2014  Cycle 10 FOLFOX 12/12/2014-Neulasta added 2. History of tobacco use/CT evidence of COPD  3. History of peptic ulcer disease  4. History of colon polyps, multiple polyps in the 06/30/2014 resection specimen  5. Chronic back pain/arthritis 6. 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 Thrombocytopenia secondary to  chemotherapy 8. Neutropenia secondary to chemotherapy   Disposition:  He has completed 9 cycles of FOLFOX. The plan is to proceed with cycle 10 today. He has mild neutropenia today. He will receive Neulasta support. He understands to contact us for a fever or symptoms of infection. We reviewed the potential toxicities associated with Neulasta and he agrees to proceed.  He will return for an office visit and chemotherapy in 2 weeks. We will discontinue oxaliplatin from the chemotherapy regimen if the neuropathy progresses.  Betsy Coder, MD  12/12/2014  9:53 AM

## 2014-12-12 NOTE — Progress Notes (Signed)
Okay to tx with ANC 1.2 per Dr. Benay Spice.

## 2014-12-14 ENCOUNTER — Ambulatory Visit: Payer: Medicare Other

## 2014-12-14 ENCOUNTER — Ambulatory Visit (HOSPITAL_BASED_OUTPATIENT_CLINIC_OR_DEPARTMENT_OTHER): Payer: Medicare Other

## 2014-12-14 DIAGNOSIS — C180 Malignant neoplasm of cecum: Secondary | ICD-10-CM | POA: Diagnosis not present

## 2014-12-14 DIAGNOSIS — C189 Malignant neoplasm of colon, unspecified: Secondary | ICD-10-CM

## 2014-12-14 MED ORDER — SODIUM CHLORIDE 0.9 % IJ SOLN
10.0000 mL | INTRAMUSCULAR | Status: DC | PRN
  Administered 2014-12-14: 10 mL
  Filled 2014-12-14: qty 10

## 2014-12-14 MED ORDER — HEPARIN SOD (PORK) LOCK FLUSH 100 UNIT/ML IV SOLN
500.0000 [IU] | Freq: Once | INTRAVENOUS | Status: AC | PRN
  Administered 2014-12-14: 500 [IU]
  Filled 2014-12-14: qty 5

## 2014-12-14 MED ORDER — PEGFILGRASTIM INJECTION 6 MG/0.6ML ~~LOC~~
6.0000 mg | PREFILLED_SYRINGE | Freq: Once | SUBCUTANEOUS | Status: AC
  Administered 2014-12-14: 6 mg via SUBCUTANEOUS
  Filled 2014-12-14: qty 0.6

## 2014-12-14 NOTE — Patient Instructions (Signed)
Pegfilgrastim injection What is this medicine? PEGFILGRASTIM (peg fil GRA stim) is a long-acting granulocyte colony-stimulating factor that stimulates the growth of neutrophils, a type of white blood cell important in the body's fight against infection. It is used to reduce the incidence of fever and infection in patients with certain types of cancer who are receiving chemotherapy that affects the bone marrow. This medicine may be used for other purposes; ask your health care provider or pharmacist if you have questions. COMMON BRAND NAME(S): Neulasta What should I tell my health care provider before I take this medicine? They need to know if you have any of these conditions: -latex allergy -ongoing radiation therapy -sickle cell disease -skin reactions to acrylic adhesives (On-Body Injector only) -an unusual or allergic reaction to pegfilgrastim, filgrastim, other medicines, foods, dyes, or preservatives -pregnant or trying to get pregnant -breast-feeding How should I use this medicine? This medicine is for injection under the skin. If you get this medicine at home, you will be taught how to prepare and give the pre-filled syringe or how to use the On-body Injector. Refer to the patient Instructions for Use for detailed instructions. Use exactly as directed. Take your medicine at regular intervals. Do not take your medicine more often than directed. It is important that you put your used needles and syringes in a special sharps container. Do not put them in a trash can. If you do not have a sharps container, call your pharmacist or healthcare provider to get one. Talk to your pediatrician regarding the use of this medicine in children. Special care may be needed. Overdosage: If you think you have taken too much of this medicine contact a poison control center or emergency room at once. NOTE: This medicine is only for you. Do not share this medicine with others. What if I miss a dose? It is  important not to miss your dose. Call your doctor or health care professional if you miss your dose. If you miss a dose due to an On-body Injector failure or leakage, a new dose should be administered as soon as possible using a single prefilled syringe for manual use. What may interact with this medicine? Interactions have not been studied. Give your health care provider a list of all the medicines, herbs, non-prescription drugs, or dietary supplements you use. Also tell them if you smoke, drink alcohol, or use illegal drugs. Some items may interact with your medicine. This list may not describe all possible interactions. Give your health care provider a list of all the medicines, herbs, non-prescription drugs, or dietary supplements you use. Also tell them if you smoke, drink alcohol, or use illegal drugs. Some items may interact with your medicine. What should I watch for while using this medicine? You may need blood work done while you are taking this medicine. If you are going to need a MRI, CT scan, or other procedure, tell your doctor that you are using this medicine (On-Body Injector only). What side effects may I notice from receiving this medicine? Side effects that you should report to your doctor or health care professional as soon as possible: -allergic reactions like skin rash, itching or hives, swelling of the face, lips, or tongue -dizziness -fever -pain, redness, or irritation at site where injected -pinpoint red spots on the skin -shortness of breath or breathing problems -stomach or side pain, or pain at the shoulder -swelling -tiredness -trouble passing urine Side effects that usually do not require medical attention (report to your doctor   or health care professional if they continue or are bothersome): -bone pain -muscle pain This list may not describe all possible side effects. Call your doctor for medical advice about side effects. You may report side effects to FDA at  1-800-FDA-1088. Where should I keep my medicine? Keep out of the reach of children. Store pre-filled syringes in a refrigerator between 2 and 8 degrees C (36 and 46 degrees F). Do not freeze. Keep in carton to protect from light. Throw away this medicine if it is left out of the refrigerator for more than 48 hours. Throw away any unused medicine after the expiration date. NOTE: This sheet is a summary. It may not cover all possible information. If you have questions about this medicine, talk to your doctor, pharmacist, or health care provider.  2015, [employer]. (2014-03-02 16:14:05)  

## 2014-12-14 NOTE — Progress Notes (Signed)
Duplicate

## 2014-12-24 ENCOUNTER — Other Ambulatory Visit: Payer: Self-pay | Admitting: Oncology

## 2014-12-27 ENCOUNTER — Other Ambulatory Visit (HOSPITAL_BASED_OUTPATIENT_CLINIC_OR_DEPARTMENT_OTHER): Payer: Medicare Other

## 2014-12-27 ENCOUNTER — Ambulatory Visit (HOSPITAL_BASED_OUTPATIENT_CLINIC_OR_DEPARTMENT_OTHER): Payer: Medicare Other | Admitting: Nurse Practitioner

## 2014-12-27 ENCOUNTER — Ambulatory Visit (HOSPITAL_BASED_OUTPATIENT_CLINIC_OR_DEPARTMENT_OTHER): Payer: Medicare Other

## 2014-12-27 VITALS — BP 95/65 | HR 103 | Temp 97.6°F | Resp 18 | Ht 69.0 in | Wt 182.6 lb

## 2014-12-27 DIAGNOSIS — R42 Dizziness and giddiness: Secondary | ICD-10-CM | POA: Diagnosis not present

## 2014-12-27 DIAGNOSIS — D701 Agranulocytosis secondary to cancer chemotherapy: Secondary | ICD-10-CM | POA: Diagnosis not present

## 2014-12-27 DIAGNOSIS — C180 Malignant neoplasm of cecum: Secondary | ICD-10-CM

## 2014-12-27 DIAGNOSIS — D6959 Other secondary thrombocytopenia: Secondary | ICD-10-CM | POA: Diagnosis not present

## 2014-12-27 DIAGNOSIS — M549 Dorsalgia, unspecified: Secondary | ICD-10-CM

## 2014-12-27 DIAGNOSIS — C189 Malignant neoplasm of colon, unspecified: Secondary | ICD-10-CM

## 2014-12-27 DIAGNOSIS — G620 Drug-induced polyneuropathy: Secondary | ICD-10-CM

## 2014-12-27 DIAGNOSIS — Z5111 Encounter for antineoplastic chemotherapy: Secondary | ICD-10-CM | POA: Diagnosis not present

## 2014-12-27 LAB — CBC WITH DIFFERENTIAL/PLATELET
BASO%: 0.2 % (ref 0.0–2.0)
Basophils Absolute: 0 10*3/uL (ref 0.0–0.1)
EOS%: 2.1 % (ref 0.0–7.0)
Eosinophils Absolute: 0.1 10*3/uL (ref 0.0–0.5)
HEMATOCRIT: 35.6 % — AB (ref 38.4–49.9)
HGB: 11.9 g/dL — ABNORMAL LOW (ref 13.0–17.1)
LYMPH#: 1.3 10*3/uL (ref 0.9–3.3)
LYMPH%: 26.9 % (ref 14.0–49.0)
MCH: 37.7 pg — ABNORMAL HIGH (ref 27.2–33.4)
MCHC: 33.4 g/dL (ref 32.0–36.0)
MCV: 112.7 fL — ABNORMAL HIGH (ref 79.3–98.0)
MONO#: 0.9 10*3/uL (ref 0.1–0.9)
MONO%: 19.4 % — ABNORMAL HIGH (ref 0.0–14.0)
NEUT#: 2.5 10*3/uL (ref 1.5–6.5)
NEUT%: 51.4 % (ref 39.0–75.0)
Platelets: 116 10*3/uL — ABNORMAL LOW (ref 140–400)
RBC: 3.16 10*6/uL — AB (ref 4.20–5.82)
RDW: 14.9 % — AB (ref 11.0–14.6)
WBC: 4.8 10*3/uL (ref 4.0–10.3)

## 2014-12-27 LAB — COMPREHENSIVE METABOLIC PANEL (CC13)
ALT: 31 U/L (ref 0–55)
AST: 42 U/L — ABNORMAL HIGH (ref 5–34)
Albumin: 3.2 g/dL — ABNORMAL LOW (ref 3.5–5.0)
Alkaline Phosphatase: 160 U/L — ABNORMAL HIGH (ref 40–150)
Anion Gap: 11 mEq/L (ref 3–11)
BILIRUBIN TOTAL: 0.43 mg/dL (ref 0.20–1.20)
BUN: 11.3 mg/dL (ref 7.0–26.0)
CHLORIDE: 105 meq/L (ref 98–109)
CO2: 24 meq/L (ref 22–29)
Calcium: 8.8 mg/dL (ref 8.4–10.4)
Creatinine: 1.2 mg/dL (ref 0.7–1.3)
EGFR: 65 mL/min/{1.73_m2} — AB (ref >90)
Glucose: 127 mg/dl (ref 70–140)
Potassium: 4.4 mEq/L (ref 3.5–5.1)
SODIUM: 139 meq/L (ref 136–145)
Total Protein: 6.9 g/dL (ref 6.4–8.3)

## 2014-12-27 MED ORDER — FLUOROURACIL CHEMO INJECTION 2.5 GM/50ML
400.0000 mg/m2 | Freq: Once | INTRAVENOUS | Status: AC
Start: 2014-12-27 — End: 2014-12-27
  Administered 2014-12-27: 800 mg via INTRAVENOUS
  Filled 2014-12-27: qty 16

## 2014-12-27 MED ORDER — LEUCOVORIN CALCIUM INJECTION 350 MG
400.0000 mg/m2 | Freq: Once | INTRAVENOUS | Status: AC
  Administered 2014-12-27: 800 mg via INTRAVENOUS
  Filled 2014-12-27: qty 40

## 2014-12-27 MED ORDER — SODIUM CHLORIDE 0.9 % IV SOLN
2400.0000 mg/m2 | INTRAVENOUS | Status: DC
  Administered 2014-12-27: 4750 mg via INTRAVENOUS
  Filled 2014-12-27: qty 95

## 2014-12-27 MED ORDER — SODIUM CHLORIDE 0.9 % IV SOLN
Freq: Once | INTRAVENOUS | Status: AC
  Administered 2014-12-27: 13:00:00 via INTRAVENOUS

## 2014-12-27 NOTE — Progress Notes (Signed)
  [place] OFFICE PROGRESS NOTE   Diagnosis:  Colon cancer  INTERVAL HISTORY:   Matthew [name] returns as scheduled. He completed cycle 10 FOLFOX on 12/12/2014. He received Neulasta support. He noted aching bone pain. No nausea or vomiting. No mouth sores. No diarrhea. He has mild persistent numbness in the fingertips. None in the feet. He notes some difficulty buttoning his shirt. He continues to have a good appetite. Over the past month he has occasionally noted dizziness with standing. He denies fever and chills.  Objective:  Vital signs in last 24 hours:  Blood pressure 95/65, pulse 103, temperature 97.6 F (36.4 C), temperature source Oral, resp. rate 18, height $RemoveBe'5\' 9"'dXcnibvRQ$  (1.753 m), weight 182 lb 9.6 oz (82.827 kg), SpO2 95 %. blood pressure sitting 103/70, heart rate 94; blood pressure standing 99/54, heart rate 96.    HEENT: No thrush or ulcers. Resp: Lungs clear bilaterally. Cardio: Regular rate and rhythm. GI: Abdomen soft and nontender. No hepatomegaly. Vascular: No leg edema. Neuro: Moderate decrease in vibratory sense over the fingertips bilaterally.  Skin: No rash. Port-A-Cath without erythema.    Lab Results:  Lab Results  Component Value Date   WBC 4.8 12/27/2014   HGB 11.9* 12/27/2014   HCT 35.6* 12/27/2014   MCV 112.7* 12/27/2014   PLT 116* 12/27/2014   NEUTROABS 2.5 12/27/2014    Imaging:  No results found.  Medications: I have reviewed the patient's current medications.  Assessment/Plan: 1. 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  Microsatellite instability-High, loss of MLH1 and equivocal loss of PMS2 expression; BRAF mutation detected.   Markedly elevated preoperative CEA.   Cycle 1 FOLFOX 08/02/2014.   Cycle 2 FOLFOX 08/16/2014.   Cycle 3 FOLFOX 08/30/2014.   Cycle 4 FOLFOX 09/13/2014  Cycle 5 FOLFOX 09/27/2014  Cycle 6 FOLFOX  10/11/2014.  Cycle 7 FOLFOX 10/25/2014  Cycle 8 FOLFOX 11/08/2014  Cycle 9 FOLFOX 11/22/2014  Cycle 10 FOLFOX 12/12/2014-Neulasta added  Cycle 11 FOLFOX 12/27/2014. Oxaliplatin eliminated due to progressive neuropathy symptoms. 2. History of tobacco use/CT evidence of COPD  3. History of peptic ulcer disease  4. History of colon polyps, multiple polyps in the 06/30/2014 resection specimen  5. Chronic back pain/arthritis 6. Early oxaliplatin neuropathy-interfering with activity (difficulty buttoning clothes). 7. Thrombocytopenia secondary to chemotherapy 8. Neutropenia secondary to chemotherapy   Disposition: Matthew [name] appears stable. He has completed 10 cycles of FOLFOX. Plan to proceed with cycle 11 today as scheduled. Oxaliplatin will be eliminated from cycles 11 and 12 due to progressive neuropathy symptoms. He will return for a follow-up visit and cycle 12 in 2 weeks. He will contact the office in the interim with any problems.  For the past month or so he has noted intermittent mild dizziness with position changes. He is not orthostatic per today's vital signs. He was instructed in slow position change.  Plan reviewed with Dr. Benay Spice.  Ned Card ANP/GNP-BC   12/27/2014  11:33 AM

## 2014-12-27 NOTE — Patient Instructions (Signed)
[place] Discharge Instructions for Patients Receiving Chemotherapy  Today you received the following chemotherapy agents 5-fu/leucovorin.   To help prevent nausea and vomiting after your treatment, we encourage you to take your nausea medication as directed.    If you develop nausea and vomiting that is not controlled by your nausea medication, call the clinic.   BELOW ARE SYMPTOMS THAT SHOULD BE REPORTED IMMEDIATELY:  *FEVER GREATER THAN 100.5 F  *CHILLS WITH OR WITHOUT FEVER  NAUSEA AND VOMITING THAT IS NOT CONTROLLED WITH YOUR NAUSEA MEDICATION  *UNUSUAL SHORTNESS OF BREATH  *UNUSUAL BRUISING OR BLEEDING  TENDERNESS IN MOUTH AND THROAT WITH OR WITHOUT PRESENCE OF ULCERS  *URINARY PROBLEMS  *BOWEL PROBLEMS  UNUSUAL RASH Items with * indicate a potential emergency and should be followed up as soon as possible.  Feel free to call the clinic you have any questions or concerns. The clinic phone number is [phone].

## 2014-12-29 ENCOUNTER — Ambulatory Visit (HOSPITAL_BASED_OUTPATIENT_CLINIC_OR_DEPARTMENT_OTHER): Payer: Medicare Other

## 2014-12-29 DIAGNOSIS — C180 Malignant neoplasm of cecum: Secondary | ICD-10-CM | POA: Diagnosis not present

## 2014-12-29 DIAGNOSIS — C189 Malignant neoplasm of colon, unspecified: Secondary | ICD-10-CM

## 2014-12-29 MED ORDER — HEPARIN SOD (PORK) LOCK FLUSH 100 UNIT/ML IV SOLN
500.0000 [IU] | Freq: Once | INTRAVENOUS | Status: AC | PRN
  Administered 2014-12-29: 500 [IU]
  Filled 2014-12-29: qty 5

## 2014-12-29 MED ORDER — SODIUM CHLORIDE 0.9 % IJ SOLN
10.0000 mL | INTRAMUSCULAR | Status: DC | PRN
  Administered 2014-12-29: 10 mL
  Filled 2014-12-29: qty 10

## 2015-01-07 ENCOUNTER — Other Ambulatory Visit: Payer: Self-pay | Admitting: Oncology

## 2015-01-08 DIAGNOSIS — C182 Malignant neoplasm of ascending colon: Secondary | ICD-10-CM | POA: Diagnosis not present

## 2015-01-10 ENCOUNTER — Other Ambulatory Visit (HOSPITAL_BASED_OUTPATIENT_CLINIC_OR_DEPARTMENT_OTHER): Payer: Medicare Other

## 2015-01-10 ENCOUNTER — Telehealth: Payer: Self-pay | Admitting: Oncology

## 2015-01-10 ENCOUNTER — Ambulatory Visit (HOSPITAL_BASED_OUTPATIENT_CLINIC_OR_DEPARTMENT_OTHER): Payer: Medicare Other | Admitting: Oncology

## 2015-01-10 ENCOUNTER — Ambulatory Visit (HOSPITAL_BASED_OUTPATIENT_CLINIC_OR_DEPARTMENT_OTHER): Payer: Medicare Other

## 2015-01-10 VITALS — BP 97/69 | HR 105 | Temp 98.1°F | Resp 18 | Ht 69.0 in | Wt 183.4 lb

## 2015-01-10 DIAGNOSIS — C189 Malignant neoplasm of colon, unspecified: Secondary | ICD-10-CM

## 2015-01-10 DIAGNOSIS — D6959 Other secondary thrombocytopenia: Secondary | ICD-10-CM

## 2015-01-10 DIAGNOSIS — C180 Malignant neoplasm of cecum: Secondary | ICD-10-CM | POA: Diagnosis not present

## 2015-01-10 DIAGNOSIS — Z5111 Encounter for antineoplastic chemotherapy: Secondary | ICD-10-CM | POA: Diagnosis not present

## 2015-01-10 LAB — COMPREHENSIVE METABOLIC PANEL (CC13)
ALT: 45 U/L (ref 0–55)
ANION GAP: 12 meq/L — AB (ref 3–11)
AST: 51 U/L — ABNORMAL HIGH (ref 5–34)
Albumin: 3.3 g/dL — ABNORMAL LOW (ref 3.5–5.0)
Alkaline Phosphatase: 134 U/L (ref 40–150)
BUN: 11.7 mg/dL (ref 7.0–26.0)
CALCIUM: 8.6 mg/dL (ref 8.4–10.4)
CHLORIDE: 105 meq/L (ref 98–109)
CO2: 21 mEq/L — ABNORMAL LOW (ref 22–29)
CREATININE: 1.1 mg/dL (ref 0.7–1.3)
EGFR: 69 mL/min/{1.73_m2} — ABNORMAL LOW (ref >90)
Glucose: 145 mg/dl — ABNORMAL HIGH (ref 70–140)
Potassium: 4.5 mEq/L (ref 3.5–5.1)
SODIUM: 138 meq/L (ref 136–145)
Total Bilirubin: 0.62 mg/dL (ref 0.20–1.20)
Total Protein: 6.8 g/dL (ref 6.4–8.3)

## 2015-01-10 LAB — CBC WITH DIFFERENTIAL/PLATELET
BASO%: 0.6 % (ref 0.0–2.0)
Basophils Absolute: 0 10*3/uL (ref 0.0–0.1)
EOS ABS: 0.1 10*3/uL (ref 0.0–0.5)
EOS%: 1.4 % (ref 0.0–7.0)
HCT: 36 % — ABNORMAL LOW (ref 38.4–49.9)
HGB: 11.9 g/dL — ABNORMAL LOW (ref 13.0–17.1)
LYMPH#: 1.1 10*3/uL (ref 0.9–3.3)
LYMPH%: 27.7 % (ref 14.0–49.0)
MCH: 38.3 pg — AB (ref 27.2–33.4)
MCHC: 33 g/dL (ref 32.0–36.0)
MCV: 115.9 fL — ABNORMAL HIGH (ref 79.3–98.0)
MONO#: 0.6 10*3/uL (ref 0.1–0.9)
MONO%: 15.8 % — AB (ref 0.0–14.0)
NEUT#: 2.1 10*3/uL (ref 1.5–6.5)
NEUT%: 54.5 % (ref 39.0–75.0)
PLATELETS: 118 10*3/uL — AB (ref 140–400)
RBC: 3.1 10*6/uL — ABNORMAL LOW (ref 4.20–5.82)
RDW: 16.1 % — ABNORMAL HIGH (ref 11.0–14.6)
WBC: 3.9 10*3/uL — ABNORMAL LOW (ref 4.0–10.3)

## 2015-01-10 LAB — CEA: CEA: 3.6 ng/mL (ref 0.0–5.0)

## 2015-01-10 MED ORDER — SODIUM CHLORIDE 0.9 % IV SOLN
2400.0000 mg/m2 | INTRAVENOUS | Status: DC
  Administered 2015-01-10: 4750 mg via INTRAVENOUS
  Filled 2015-01-10: qty 95

## 2015-01-10 MED ORDER — DEXTROSE 5 % IV SOLN
400.0000 mg/m2 | Freq: Once | INTRAVENOUS | Status: AC
  Administered 2015-01-10: 800 mg via INTRAVENOUS
  Filled 2015-01-10: qty 40

## 2015-01-10 MED ORDER — DEXTROSE 5 % IV SOLN
Freq: Once | INTRAVENOUS | Status: AC
  Administered 2015-01-10: 10:00:00 via INTRAVENOUS

## 2015-01-10 MED ORDER — FLUOROURACIL CHEMO INJECTION 2.5 GM/50ML
400.0000 mg/m2 | Freq: Once | INTRAVENOUS | Status: AC
  Administered 2015-01-10: 800 mg via INTRAVENOUS
  Filled 2015-01-10: qty 16

## 2015-01-10 NOTE — Progress Notes (Signed)
  [place] OFFICE PROGRESS NOTE   Diagnosis: Colon cancer  INTERVAL HISTORY:  Matthew [name] completed another cycle of chemotherapy on 1/13. The neuropathy symptoms have progressed.Matthew [name] has difficulty buttoning his shirt.  Objective:  Vital signs in last 24 hours:  Blood pressure 97/69, pulse 105, temperature 98.1 F (36.7 C), temperature source Oral, resp. rate 18, height _0  (1.753 m), weight 183 lb 6.4 oz (83.19 kg).    HEENT: no thrush or ulcers Lymphatics: no cervical, supraclavicular, axillary, or inguinal nodes Resp: lungs clear bilaterally Cardio: RRR GI:no HSM, no mass Vascular: no leg edema Neuro:moderate to severe loss of vibratory sense at the fingertips bilaterally   Portacath/PICC-without erythema  Lab Results:  Lab Results  Component Value Date   WBC 3.9* 01/10/2015   HGB 11.9* 01/10/2015   HCT 36.0* 01/10/2015   MCV 115.9* 01/10/2015   PLT 118* 01/10/2015   NEUTROABS 2.1 01/10/2015     Lab Results  Component Value Date   CEA 4.3 09/13/2014    Medications: I have reviewed the patient's current medications.  Assessment/Plan: 1. 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  Microsatellite instability-High, loss of MLH1 and equivocal loss of PMS2 expression; BRAF mutation detected.   Markedly elevated preoperative CEA.   Cycle 1 FOLFOX 08/02/2014.   Cycle 2 FOLFOX 08/16/2014.   Cycle 3 FOLFOX 08/30/2014.   Cycle 4 FOLFOX 09/13/2014  Cycle 5 FOLFOX 09/27/2014  Cycle 6 FOLFOX 10/11/2014.  Cycle 7 FOLFOX 10/25/2014  Cycle 8 FOLFOX 11/08/2014  Cycle 9 FOLFOX 11/22/2014  Cycle 10 FOLFOX 12/12/2014-Neulasta added  Cycle 11 FOLFOX 12/27/2014. Oxaliplatin eliminated due to progressive neuropathy symptoms. 2. History of tobacco use/CT evidence of COPD  3. History of peptic ulcer disease  4. History of colon polyps, multiple polyps in the  06/30/2014 resection specimen  5. Chronic back pain/arthritis 6.   oxaliplatin neuropathy-interfering with activity (difficulty buttoning clothes). 6. Thrombocytopenia secondary to chemotherapy  Disposition:  Matthew [name] will complete the final cycle of chemotherapy today. We will f/u on the CEA today.  Matthew [name] will be scheduled for a 3 month office visit.  The portacath will remain in place until after the 1 year restaging CTs.  Betsy Coder, MD  01/10/2015  9:27 AM

## 2015-01-10 NOTE — Patient Instructions (Signed)
[facility] Discharge Instructions for Patients Receiving Chemotherapy  Today you received the following chemotherapy agents: Leucovorin and 5FU   To help prevent nausea and vomiting after your treatment, we encourage you to take your nausea medication as prescribed.    If you develop nausea and vomiting that is not controlled by your nausea medication, call the clinic.   BELOW ARE SYMPTOMS THAT SHOULD BE REPORTED IMMEDIATELY:  *FEVER GREATER THAN 100.5 F  *CHILLS WITH OR WITHOUT FEVER  NAUSEA AND VOMITING THAT IS NOT CONTROLLED WITH YOUR NAUSEA MEDICATION  *UNUSUAL SHORTNESS OF BREATH  *UNUSUAL BRUISING OR BLEEDING  TENDERNESS IN MOUTH AND THROAT WITH OR WITHOUT PRESENCE OF ULCERS  *URINARY PROBLEMS  *BOWEL PROBLEMS  UNUSUAL RASH Items with * indicate a potential emergency and should be followed up as soon as possible.  Feel free to call the clinic you have any questions or concerns. The clinic phone number is [phone].    

## 2015-01-10 NOTE — Telephone Encounter (Signed)
gv adn printed appt sched and avs fo rpt for Jan thru March

## 2015-01-11 ENCOUNTER — Telehealth: Payer: Self-pay | Admitting: *Deleted

## 2015-01-11 NOTE — Telephone Encounter (Signed)
Called pt with CEA results. He voiced understanding and appreciation for call.

## 2015-01-11 NOTE — Telephone Encounter (Signed)
-----   Message from Ladell Pier, MD sent at 01/10/2015  5:49 PM EST ----- Please call patient, cea is normal

## 2015-01-12 ENCOUNTER — Ambulatory Visit (HOSPITAL_BASED_OUTPATIENT_CLINIC_OR_DEPARTMENT_OTHER): Payer: Medicare Other

## 2015-01-12 VITALS — BP 116/71 | HR 79 | Temp 97.9°F | Resp 20

## 2015-01-12 DIAGNOSIS — Z452 Encounter for adjustment and management of vascular access device: Secondary | ICD-10-CM

## 2015-01-12 DIAGNOSIS — C180 Malignant neoplasm of cecum: Secondary | ICD-10-CM | POA: Diagnosis not present

## 2015-01-12 MED ORDER — SODIUM CHLORIDE 0.9 % IJ SOLN
10.0000 mL | INTRAMUSCULAR | Status: DC | PRN
  Administered 2015-01-12: 10 mL via INTRAVENOUS
  Filled 2015-01-12: qty 10

## 2015-01-12 MED ORDER — HEPARIN SOD (PORK) LOCK FLUSH 100 UNIT/ML IV SOLN
500.0000 [IU] | Freq: Once | INTRAVENOUS | Status: AC
  Administered 2015-01-12: 500 [IU] via INTRAVENOUS
  Filled 2015-01-12: qty 5

## 2015-02-21 ENCOUNTER — Ambulatory Visit (HOSPITAL_BASED_OUTPATIENT_CLINIC_OR_DEPARTMENT_OTHER): Payer: Medicare Other

## 2015-02-21 VITALS — BP 120/77 | HR 105 | Temp 97.4°F

## 2015-02-21 DIAGNOSIS — C180 Malignant neoplasm of cecum: Secondary | ICD-10-CM

## 2015-02-21 DIAGNOSIS — Z452 Encounter for adjustment and management of vascular access device: Secondary | ICD-10-CM

## 2015-02-21 DIAGNOSIS — Z95828 Presence of other vascular implants and grafts: Secondary | ICD-10-CM

## 2015-02-21 MED ORDER — SODIUM CHLORIDE 0.9 % IJ SOLN
10.0000 mL | INTRAMUSCULAR | Status: DC | PRN
  Administered 2015-02-21: 10 mL via INTRAVENOUS
  Filled 2015-02-21: qty 10

## 2015-02-21 MED ORDER — HEPARIN SOD (PORK) LOCK FLUSH 100 UNIT/ML IV SOLN
500.0000 [IU] | Freq: Once | INTRAVENOUS | Status: AC
  Administered 2015-02-21: 500 [IU] via INTRAVENOUS
  Filled 2015-02-21: qty 5

## 2015-02-21 NOTE — Patient Instructions (Signed)

## 2015-04-04 ENCOUNTER — Other Ambulatory Visit: Payer: Medicare Other

## 2015-04-04 ENCOUNTER — Ambulatory Visit: Payer: Medicare Other | Admitting: Nurse Practitioner

## 2015-04-06 DIAGNOSIS — C182 Malignant neoplasm of ascending colon: Secondary | ICD-10-CM | POA: Diagnosis not present

## 2015-06-11 ENCOUNTER — Telehealth: Payer: Self-pay | Admitting: *Deleted

## 2015-06-11 DIAGNOSIS — C189 Malignant neoplasm of colon, unspecified: Secondary | ICD-10-CM

## 2015-06-11 NOTE — Telephone Encounter (Signed)
Needs office visit and CEA next 1 month Matthew [name] or lisa

## 2015-06-11 NOTE — Telephone Encounter (Signed)
Pt called and left message wanting to talk to nurse about neuropathy.  Spoke with pt and was informed that pt still has severe neuropathy from chemo - not able to hold things, and some tingling in hands and feet.  Informed pt that it would take time - months to at least a year for neuropathy to resolve but not completely gone in some cases.  Cautioned pt not to drive, or using sharp instruments, or extreme temperature exposure. Asked if pt did not wish to follow up with Dr. Benay Spice in April; pt stated he was not aware of the appts.   Pt would like to reschedule. Message sent to Dr. Benay Spice, and Ubaldo Glassing, desk nurse. Pt's  Phone    [phone].

## 2015-06-12 ENCOUNTER — Telehealth: Payer: Self-pay | Admitting: Oncology

## 2015-06-12 ENCOUNTER — Other Ambulatory Visit: Payer: Self-pay | Admitting: *Deleted

## 2015-06-12 NOTE — Addendum Note (Signed)
Addended by: Adalberto Cole on: 06/12/2015 09:53 AM   Modules accepted: Orders

## 2015-06-12 NOTE — Telephone Encounter (Signed)
POF sent to scheduler 

## 2015-06-12 NOTE — Telephone Encounter (Signed)
S/w pt confirming labs/flush/ov per 06/28 POF, mailed schedule to pt per pt's req... KJ

## 2015-07-10 DIAGNOSIS — C182 Malignant neoplasm of ascending colon: Secondary | ICD-10-CM | POA: Diagnosis not present

## 2015-07-11 DIAGNOSIS — R1013 Epigastric pain: Secondary | ICD-10-CM | POA: Diagnosis not present

## 2015-07-11 DIAGNOSIS — Z85038 Personal history of other malignant neoplasm of large intestine: Secondary | ICD-10-CM | POA: Diagnosis not present

## 2015-07-11 DIAGNOSIS — Z86010 Personal history of colonic polyps: Secondary | ICD-10-CM | POA: Diagnosis not present

## 2015-07-12 ENCOUNTER — Ambulatory Visit (HOSPITAL_BASED_OUTPATIENT_CLINIC_OR_DEPARTMENT_OTHER): Payer: Medicare Other | Admitting: Nurse Practitioner

## 2015-07-12 ENCOUNTER — Telehealth: Payer: Self-pay | Admitting: Oncology

## 2015-07-12 ENCOUNTER — Ambulatory Visit (HOSPITAL_BASED_OUTPATIENT_CLINIC_OR_DEPARTMENT_OTHER): Payer: Medicare Other

## 2015-07-12 ENCOUNTER — Other Ambulatory Visit (HOSPITAL_BASED_OUTPATIENT_CLINIC_OR_DEPARTMENT_OTHER): Payer: Medicare Other

## 2015-07-12 VITALS — BP 131/80 | HR 94 | Temp 97.7°F | Resp 17 | Ht 69.0 in | Wt 201.3 lb

## 2015-07-12 DIAGNOSIS — C180 Malignant neoplasm of cecum: Secondary | ICD-10-CM | POA: Diagnosis not present

## 2015-07-12 DIAGNOSIS — R209 Unspecified disturbances of skin sensation: Secondary | ICD-10-CM

## 2015-07-12 DIAGNOSIS — Z95828 Presence of other vascular implants and grafts: Secondary | ICD-10-CM

## 2015-07-12 DIAGNOSIS — C189 Malignant neoplasm of colon, unspecified: Secondary | ICD-10-CM | POA: Diagnosis not present

## 2015-07-12 DIAGNOSIS — Z452 Encounter for adjustment and management of vascular access device: Secondary | ICD-10-CM | POA: Diagnosis not present

## 2015-07-12 LAB — CBC WITH DIFFERENTIAL/PLATELET
BASO%: 0.5 % (ref 0.0–2.0)
Basophils Absolute: 0 10*3/uL (ref 0.0–0.1)
EOS%: 3.2 % (ref 0.0–7.0)
Eosinophils Absolute: 0.2 10*3/uL (ref 0.0–0.5)
HCT: 38.7 % (ref 38.4–49.9)
HGB: 13.4 g/dL (ref 13.0–17.1)
LYMPH%: 29.5 % (ref 14.0–49.0)
MCH: 34.4 pg — AB (ref 27.2–33.4)
MCHC: 34.6 g/dL (ref 32.0–36.0)
MCV: 99.4 fL — ABNORMAL HIGH (ref 79.3–98.0)
MONO#: 0.6 10*3/uL (ref 0.1–0.9)
MONO%: 11.7 % (ref 0.0–14.0)
NEUT#: 2.9 10*3/uL (ref 1.5–6.5)
NEUT%: 55.1 % (ref 39.0–75.0)
Platelets: 160 10*3/uL (ref 140–400)
RBC: 3.89 10*6/uL — AB (ref 4.20–5.82)
RDW: 13.5 % (ref 11.0–14.6)
WBC: 5.3 10*3/uL (ref 4.0–10.3)
lymph#: 1.6 10*3/uL (ref 0.9–3.3)

## 2015-07-12 LAB — COMPREHENSIVE METABOLIC PANEL (CC13)
ALBUMIN: 3.7 g/dL (ref 3.5–5.0)
ALT: 62 U/L — AB (ref 0–55)
ANION GAP: 8 meq/L (ref 3–11)
AST: 50 U/L — ABNORMAL HIGH (ref 5–34)
Alkaline Phosphatase: 122 U/L (ref 40–150)
BUN: 19.3 mg/dL (ref 7.0–26.0)
CALCIUM: 9.2 mg/dL (ref 8.4–10.4)
CO2: 24 mEq/L (ref 22–29)
Chloride: 105 mEq/L (ref 98–109)
Creatinine: 1.1 mg/dL (ref 0.7–1.3)
EGFR: 66 mL/min/{1.73_m2} — AB (ref >90)
GLUCOSE: 124 mg/dL (ref 70–140)
Potassium: 4.6 mEq/L (ref 3.5–5.1)
Sodium: 138 mEq/L (ref 136–145)
Total Bilirubin: 0.51 mg/dL (ref 0.20–1.20)
Total Protein: 6.8 g/dL (ref 6.4–8.3)

## 2015-07-12 MED ORDER — SODIUM CHLORIDE 0.9 % IJ SOLN
10.0000 mL | INTRAMUSCULAR | Status: DC | PRN
  Administered 2015-07-12: 10 mL via INTRAVENOUS
  Filled 2015-07-12: qty 10

## 2015-07-12 MED ORDER — HEPARIN SOD (PORK) LOCK FLUSH 100 UNIT/ML IV SOLN
500.0000 [IU] | Freq: Once | INTRAVENOUS | Status: AC
  Administered 2015-07-12: 500 [IU] via INTRAVENOUS
  Filled 2015-07-12: qty 5

## 2015-07-12 NOTE — Progress Notes (Signed)
[facility] OFFICE PROGRESS NOTE   Diagnosis:  Colon cancer  INTERVAL HISTORY:   [name] returns for follow-up. He completed the 12th and final cycle of FOLFOX on 01/10/2015. Oxaliplatin was held due to neuropathy symptoms. He was scheduled to return for a follow-up visit in April. He was not aware of that appointment. He reports that the numbness in his hands and feet increased following the last chemotherapy. The numbness has been stable over several months now. There is no associated pain. He notes difficulty with buttoning shirts, tying shoes. He occasionally drops objects. No nausea or vomiting. Bowels moving regularly. No bloody or black stools. He has a good appetite. He is gaining weight. He is scheduled for a colonoscopy 07/16/2015.  Objective:  Vital signs in last 24 hours:  Blood pressure 131/80, pulse 94, temperature 97.7 F (36.5 C), temperature source Oral, resp. rate 17, height $RemoveBe'5\' 9"'ZHKUbeATh$  (1.753 m), weight 201 lb 4.8 oz (91.309 kg), SpO2 95 %.    HEENT: No thrush or ulcers. Lymphatics: No palpable cervical, supraclavicular, axillary or inguinal lymph nodes. Resp: Lungs clear bilaterally. Cardio: Regular rate and rhythm. GI: Abdomen soft and nontender. No hepatomegaly. No mass. Vascular: No leg edema. Neuro: Moderate decrease in vibratory sense over the fingertips per tuning fork exam.  Port-A-Cath without erythema.    Lab Results:  Lab Results  Component Value Date   WBC 5.3 07/12/2015   HGB 13.4 07/12/2015   HCT 38.7 07/12/2015   MCV 99.4* 07/12/2015   PLT 160 07/12/2015   NEUTROABS 2.9 07/12/2015    Imaging:  No results found.  Medications: I have reviewed the patient's current medications.  Assessment/Plan: 1. 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  Microsatellite instability-High, loss of MLH1 and equivocal loss of PMS2 expression; BRAF mutation  detected.   Markedly elevated preoperative CEA.   Cycle 1 FOLFOX 08/02/2014.   Cycle 2 FOLFOX 08/16/2014.   Cycle 3 FOLFOX 08/30/2014.   Cycle 4 FOLFOX 09/13/2014  Cycle 5 FOLFOX 09/27/2014  Cycle 6 FOLFOX 10/11/2014.  Cycle 7 FOLFOX 10/25/2014  Cycle 8 FOLFOX 11/08/2014  Cycle 9 FOLFOX 11/22/2014  Cycle 10 FOLFOX 12/12/2014-Neulasta added  Cycle 11 FOLFOX 12/27/2014. Oxaliplatin eliminated due to progressive neuropathy symptoms.  Cycle 12 FOLFOX 01/10/2015 (no oxaliplatin) 2. History of tobacco use/CT evidence of COPD  3. History of peptic ulcer disease  4. History of colon polyps, multiple polyps in the 06/30/2014 resection specimen  5. Chronic back pain/arthritis 6. oxaliplatin neuropathy-interfering with activity (difficulty buttoning clothes). 7. Thrombocytopenia secondary to chemotherapy   Disposition: Mr. Hands appears stable. We will follow-up on the CEA from today. He is scheduled for a colonoscopy next week. We will obtain one-year restaging CTs in the next 2 weeks. We will contact him with the result. If scans are negative we will refer him for removal of the Port-A-Cath.   He has persistent numbness in the hands and feet. The numbness seems to have stabilized over the past few months. There is no associated pain. We discussed that the numbness is most likely oxaliplatin neuropathy and hopefully will improve over many months. He will call if he develops painful neuropathy symptoms.   He will return for a follow-up visit and CEA in 6 months. He will contact the office in the interim as outlined above or with any other problems.  Plan reviewed with Dr. Benay Spice.  Ned Card ANP/GNP-BC   07/12/2015  11:05  AM

## 2015-07-12 NOTE — Telephone Encounter (Signed)
Gave and printed appt sched and avs for pt for Sept, NOV and Jan

## 2015-07-12 NOTE — Patient Instructions (Signed)

## 2015-07-13 ENCOUNTER — Other Ambulatory Visit: Payer: Self-pay | Admitting: Oncology

## 2015-07-13 ENCOUNTER — Telehealth: Payer: Self-pay | Admitting: Oncology

## 2015-07-13 DIAGNOSIS — C189 Malignant neoplasm of colon, unspecified: Secondary | ICD-10-CM

## 2015-07-13 LAB — CEA: CEA: 3 ng/mL (ref 0.0–5.0)

## 2015-07-13 NOTE — Telephone Encounter (Signed)
Called to inform pt. Of Cea results not available.

## 2015-07-13 NOTE — Telephone Encounter (Signed)
Informed Pt. That CEA results were normal. Pt. Verbalized understanding. Denies any questions or concerns.

## 2015-07-13 NOTE — Telephone Encounter (Signed)
-----   Message from Bevelyn Ngo, LPN sent at 6/41/5830  9:24 AM EDT -----   ----- Message -----    From: Ladell Pier, MD    Sent: 07/13/2015   8:33 AM      To: Adalberto Cole, RN, Brien Few, RN, #  Please call patient, cea is normal

## 2015-07-16 DIAGNOSIS — Z09 Encounter for follow-up examination after completed treatment for conditions other than malignant neoplasm: Secondary | ICD-10-CM | POA: Diagnosis not present

## 2015-07-16 DIAGNOSIS — K5730 Diverticulosis of large intestine without perforation or abscess without bleeding: Secondary | ICD-10-CM | POA: Diagnosis not present

## 2015-07-16 DIAGNOSIS — Z980 Intestinal bypass and anastomosis status: Secondary | ICD-10-CM | POA: Diagnosis not present

## 2015-07-16 DIAGNOSIS — Z85038 Personal history of other malignant neoplasm of large intestine: Secondary | ICD-10-CM | POA: Diagnosis not present

## 2015-07-23 ENCOUNTER — Ambulatory Visit (HOSPITAL_COMMUNITY)
Admission: RE | Admit: 2015-07-23 | Discharge: 2015-07-23 | Disposition: A | Discharge status: Home / Self Care | Payer: Medicare Other | Source: Ambulatory Visit | Attending: Nurse Practitioner | Admitting: Nurse Practitioner

## 2015-07-23 ENCOUNTER — Encounter (HOSPITAL_COMMUNITY): Payer: Self-pay

## 2015-07-23 DIAGNOSIS — N281 Cyst of kidney, acquired: Secondary | ICD-10-CM | POA: Insufficient documentation

## 2015-07-23 DIAGNOSIS — Z980 Intestinal bypass and anastomosis status: Secondary | ICD-10-CM | POA: Diagnosis not present

## 2015-07-23 DIAGNOSIS — R911 Solitary pulmonary nodule: Secondary | ICD-10-CM | POA: Insufficient documentation

## 2015-07-23 DIAGNOSIS — K449 Diaphragmatic hernia without obstruction or gangrene: Secondary | ICD-10-CM | POA: Diagnosis not present

## 2015-07-23 DIAGNOSIS — J439 Emphysema, unspecified: Secondary | ICD-10-CM | POA: Insufficient documentation

## 2015-07-23 DIAGNOSIS — Z9049 Acquired absence of other specified parts of digestive tract: Secondary | ICD-10-CM | POA: Insufficient documentation

## 2015-07-23 DIAGNOSIS — Z9221 Personal history of antineoplastic chemotherapy: Secondary | ICD-10-CM | POA: Diagnosis not present

## 2015-07-23 DIAGNOSIS — C189 Malignant neoplasm of colon, unspecified: Secondary | ICD-10-CM

## 2015-07-23 DIAGNOSIS — K760 Fatty (change of) liver, not elsewhere classified: Secondary | ICD-10-CM | POA: Insufficient documentation

## 2015-07-23 DIAGNOSIS — I700 Atherosclerosis of aorta: Secondary | ICD-10-CM | POA: Insufficient documentation

## 2015-07-23 DIAGNOSIS — K8020 Calculus of gallbladder without cholecystitis without obstruction: Secondary | ICD-10-CM | POA: Insufficient documentation

## 2015-07-23 DIAGNOSIS — Z85038 Personal history of other malignant neoplasm of large intestine: Secondary | ICD-10-CM | POA: Diagnosis not present

## 2015-07-23 DIAGNOSIS — R160 Hepatomegaly, not elsewhere classified: Secondary | ICD-10-CM | POA: Insufficient documentation

## 2015-07-23 MED ORDER — IOHEXOL 300 MG/ML  SOLN
100.0000 mL | Freq: Once | INTRAMUSCULAR | Status: AC | PRN
  Administered 2015-07-23: 100 mL via INTRAVENOUS

## 2015-07-24 IMAGING — CR DG ABD PORTABLE 1V
1 series · 1 of 1 positions shown · non-contrast
Comparison: 06/29/2014

CLINICAL DATA: NG tube placement.  Nausea, vomiting and diarrhea.

EXAM:
PORTABLE ABDOMEN - 1 VIEW

[ap (kub)]
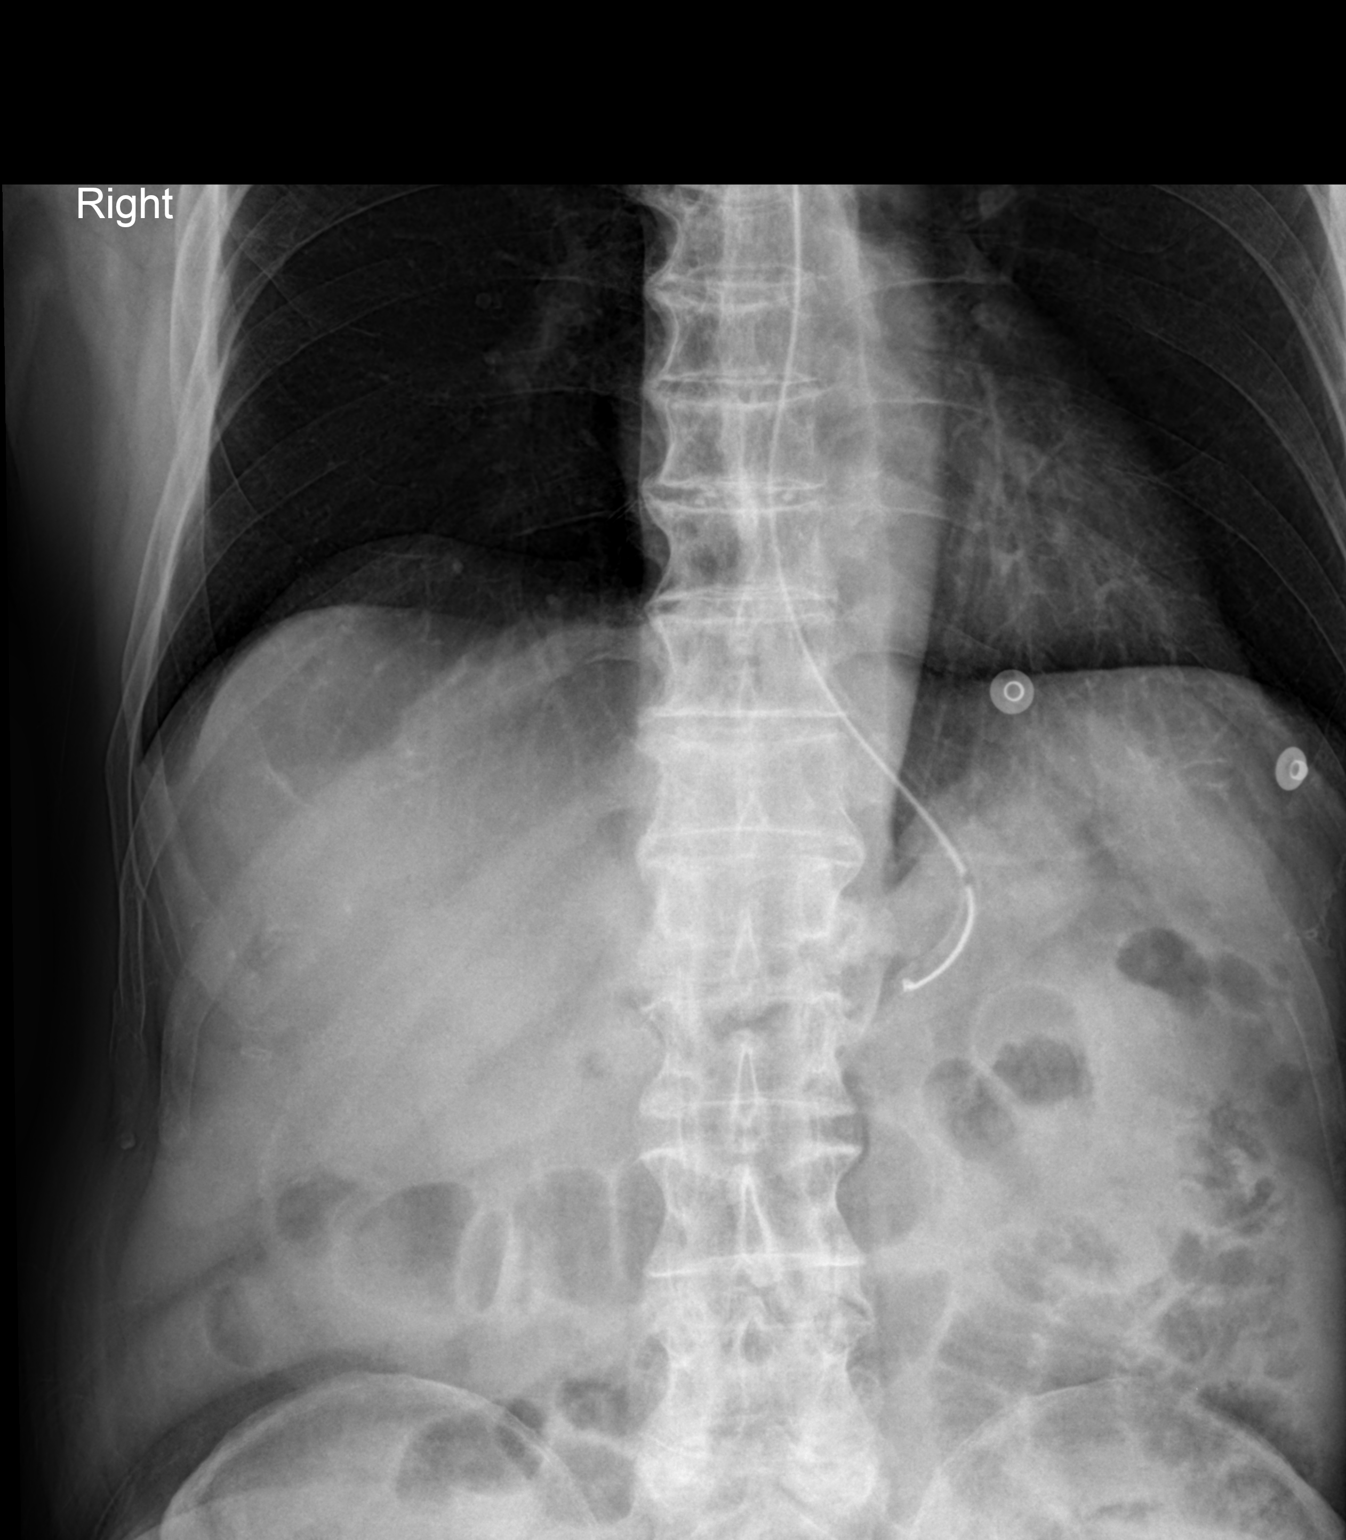

[1 of 1 positions shown; findings below may reference images not displayed]

FINDINGS: There has been placement of a nasogastric tube which courses into
the region of the stomach with tip in the region of the body of the
stomach and side-port in the region of the gastric fundus just below
the expected level of the gastroesophageal junction. This could be
advanced another 5 cm. Remainder the exam is unchanged.
IMPRESSION: Nasogastric tube with tip over the stomach in the left upper
quadrant. Side port just below the gastroesophageal junction as the
tube could be advanced another 5 cm.

## 2015-07-24 IMAGING — CR DG ABDOMEN ACUTE W/ 1V CHEST
3 series · 3 of 3 positions shown · non-contrast
Comparison: CTs of 06/02/14.

CLINICAL DATA: Abdominal pain.  Nausea.  Bloating.  COPD.

EXAM:
ACUTE ABDOMEN SERIES (ABDOMEN 2 VIEW & CHEST 1 VIEW)

[w chest pa]
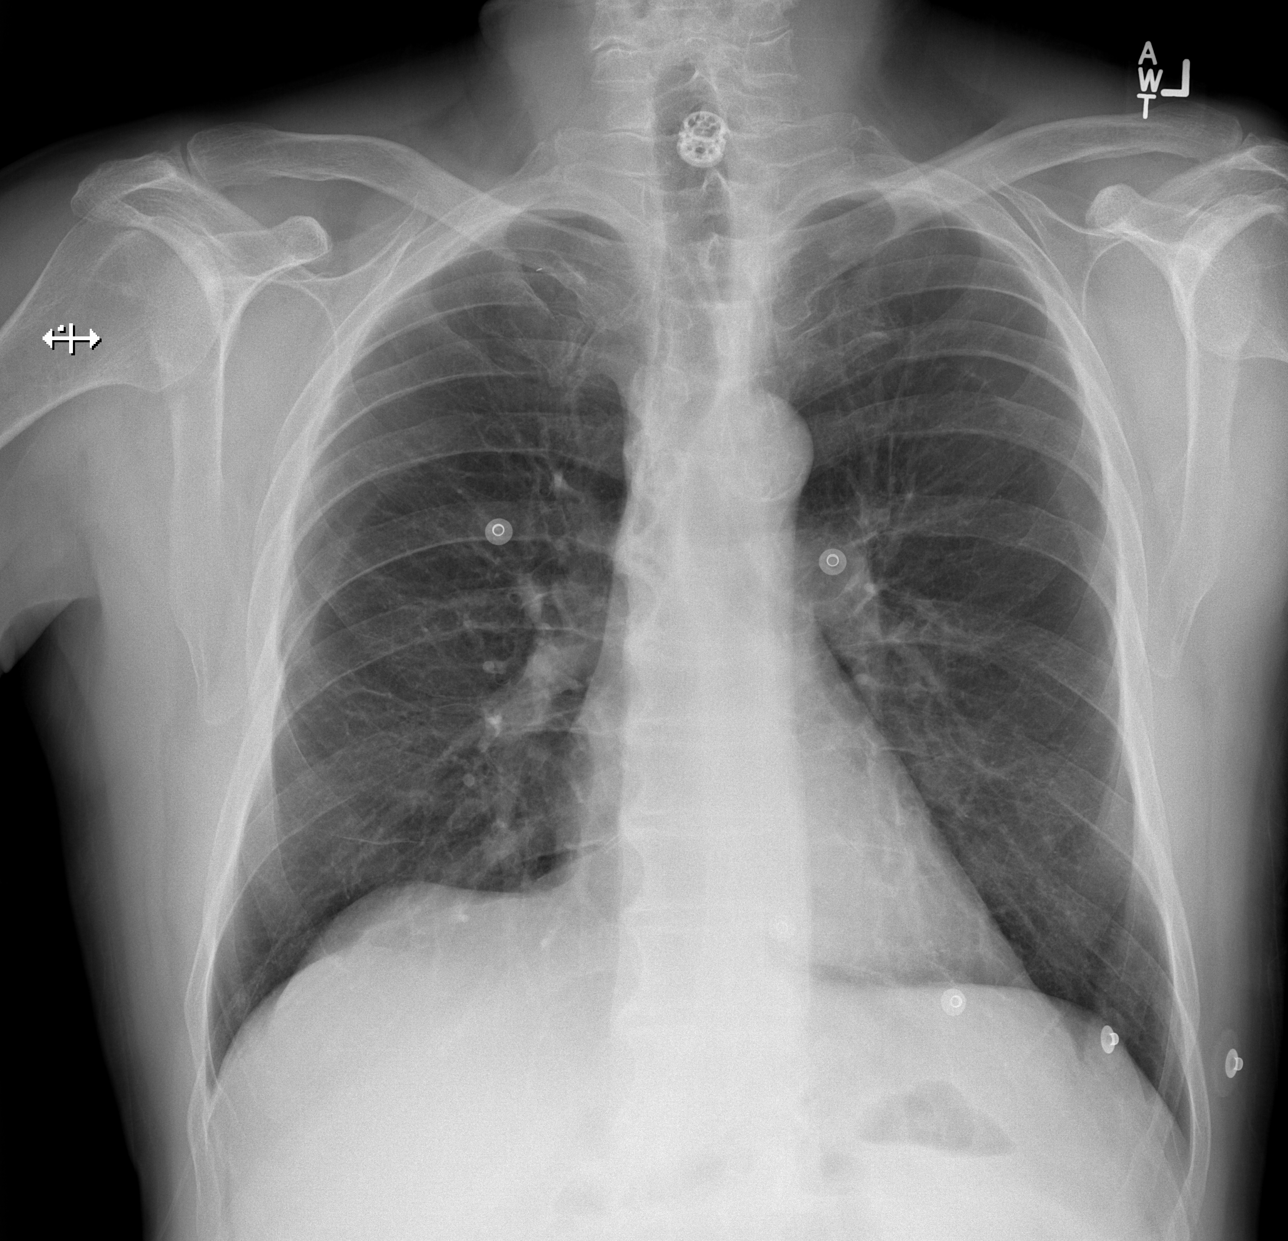

[w abdomen upright]
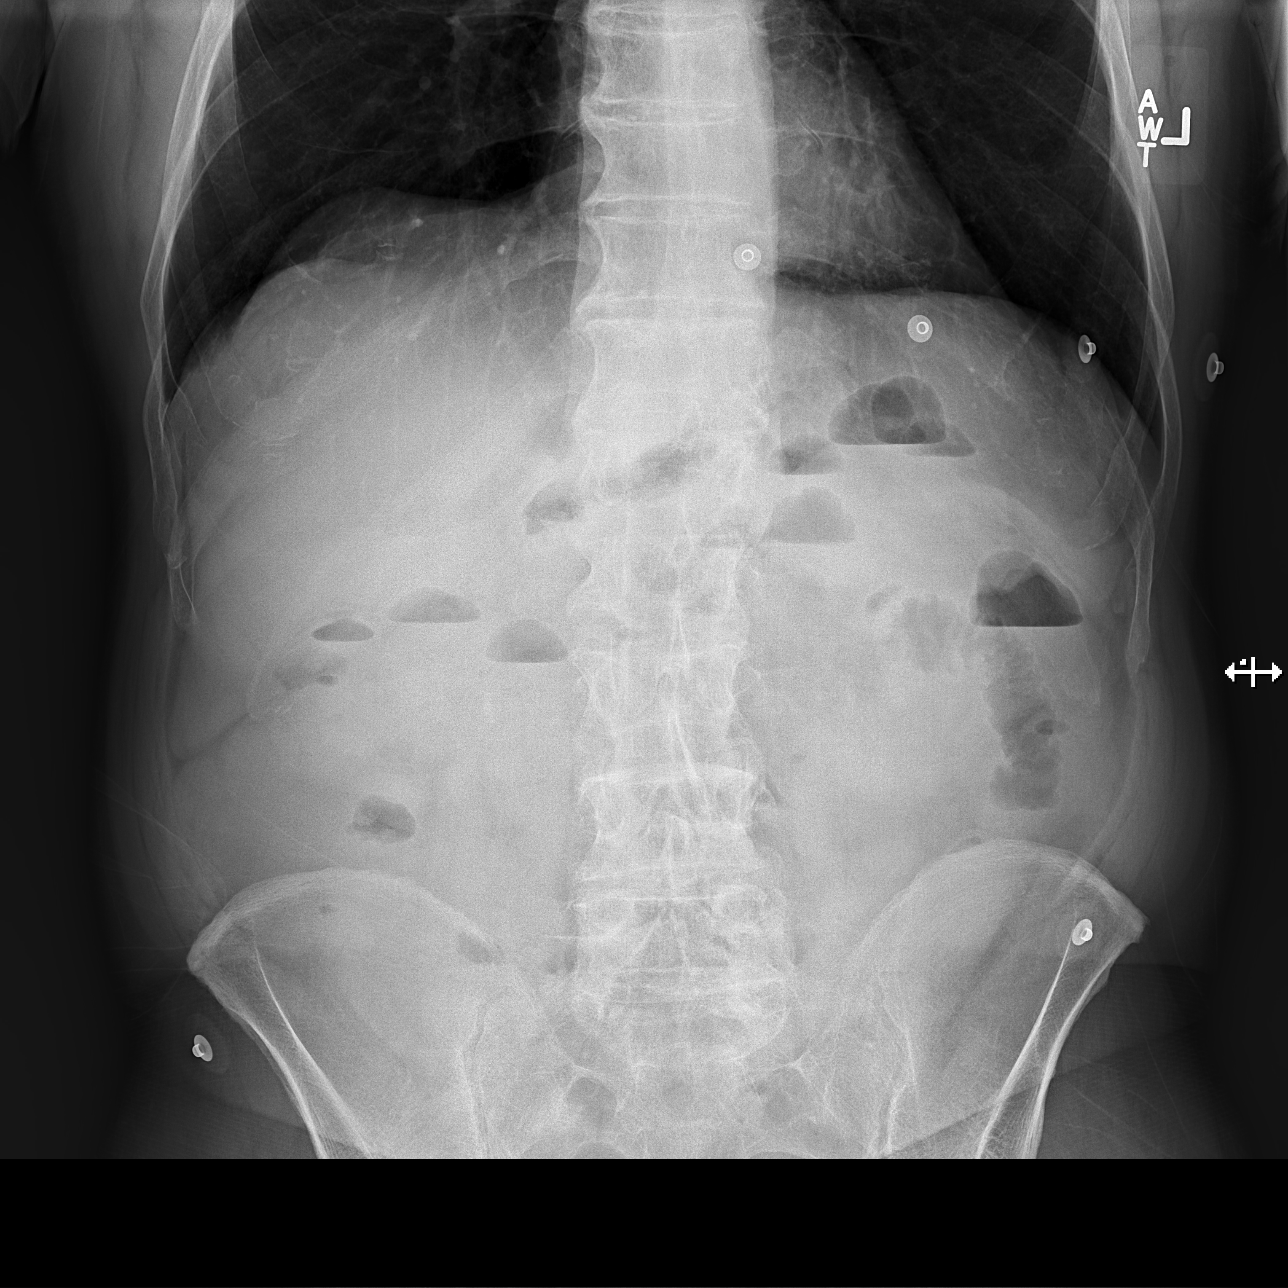

[t abdomen supine]
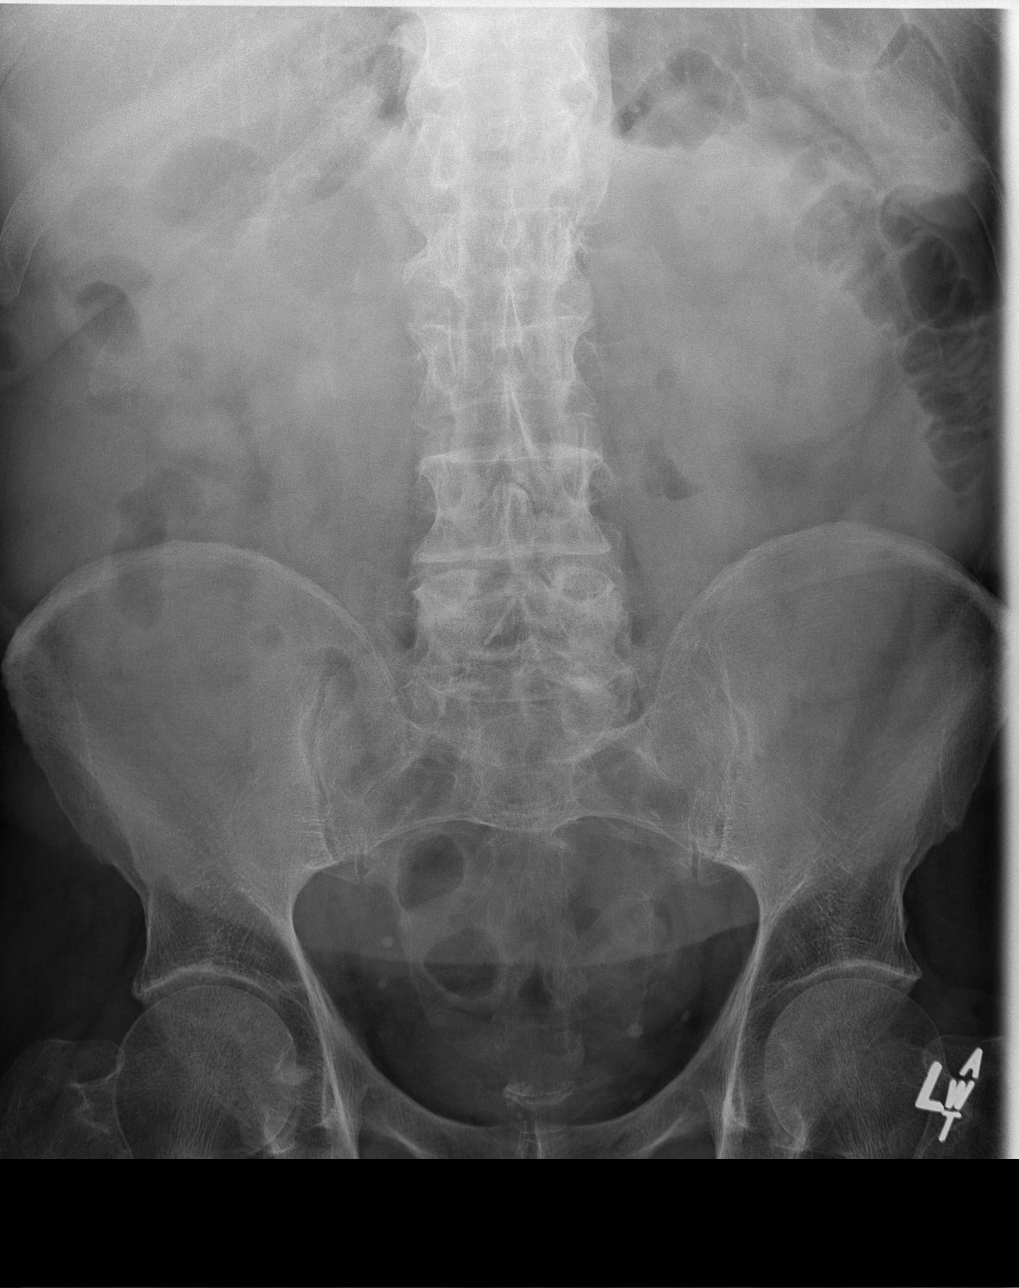

[3 of 3 positions shown; findings below may reference images not displayed]

FINDINGS: Frontal view of the chest demonstrates presumed external artifact
projecting over the trachea. Midline trachea. Normal heart size,
with atherosclerosis in the transverse aorta. No pleural effusion or
pneumothorax.

Right base scarring head.

Abdominal films demonstrate small bowel air-fluid levels throughout
the upper abdomen on upright positioning. No free intraperitoneal
air.

Supine imaging demonstrates gas within mildly dilated small bowel in
left upper quadrant. 3.2 cm. Paucity of mid abdominal small bowel
loops. There is likely sigmoid colonic gas. Phleboliths.
IMPRESSION: No acute cardiopulmonary disease.

Small bowel air-fluid levels with mild small bowel dilatation.
Suspicious for partial small bowel obstruction. Adynamic ileus could
look similar. No free intraperitoneal air or other acute
complication identified.

Atherosclerosis.

## 2015-07-25 ENCOUNTER — Telehealth: Payer: Self-pay | Admitting: *Deleted

## 2015-07-25 NOTE — Telephone Encounter (Signed)
-----   Message from Ladell Pier, MD sent at 07/25/2015  2:44 PM EDT ----- Please call patient, Cts negative for cancer

## 2015-07-25 NOTE — Telephone Encounter (Signed)
Called pt with CT report: Negative for cancer, per Dr. Benay Spice. He voiced understanding.

## 2015-07-25 NOTE — Telephone Encounter (Signed)
Oncology Nurse Navigator Documentation  Oncology Nurse Navigator Flowsheets 07/25/2015  Navigator Encounter Type Telephone  Treatment Phase Routine follow up  Barriers/Navigation Needs No barriers at this time  Matthew [name] aware that his CT scan was negative for cancer. He reports he is doing well and has no unmet needs at this time.

## 2015-08-09 DIAGNOSIS — F4312 Post-traumatic stress disorder, chronic: Secondary | ICD-10-CM | POA: Diagnosis not present

## 2015-08-10 ENCOUNTER — Other Ambulatory Visit: Payer: Self-pay | Admitting: Oncology

## 2015-08-10 ENCOUNTER — Telehealth: Payer: Self-pay | Admitting: *Deleted

## 2015-08-10 ENCOUNTER — Other Ambulatory Visit: Payer: Self-pay | Admitting: *Deleted

## 2015-08-10 DIAGNOSIS — C189 Malignant neoplasm of colon, unspecified: Secondary | ICD-10-CM

## 2015-08-10 NOTE — Telephone Encounter (Signed)
Per request; notified pt that Dr. Benay Spice put in request for port removal to [facility].  Pt verbalized understanding and expressed appreciation for call.

## 2015-08-13 ENCOUNTER — Telehealth: Payer: Self-pay | Admitting: Oncology

## 2015-08-13 ENCOUNTER — Other Ambulatory Visit: Payer: Self-pay | Admitting: General Surgery

## 2015-08-13 NOTE — Telephone Encounter (Signed)
Referral noted for port removal and inbasket sent to [facility] schedule for dr Barry Dienes

## 2015-08-25 IMAGING — CR DG CHEST 1V PORT
1 series · 1 of 1 positions shown · non-contrast
Comparison: 06/29/2014

CLINICAL DATA: Port-A-Cath insertion

EXAM:
PORTABLE CHEST - 1 VIEW

[view not recorded]
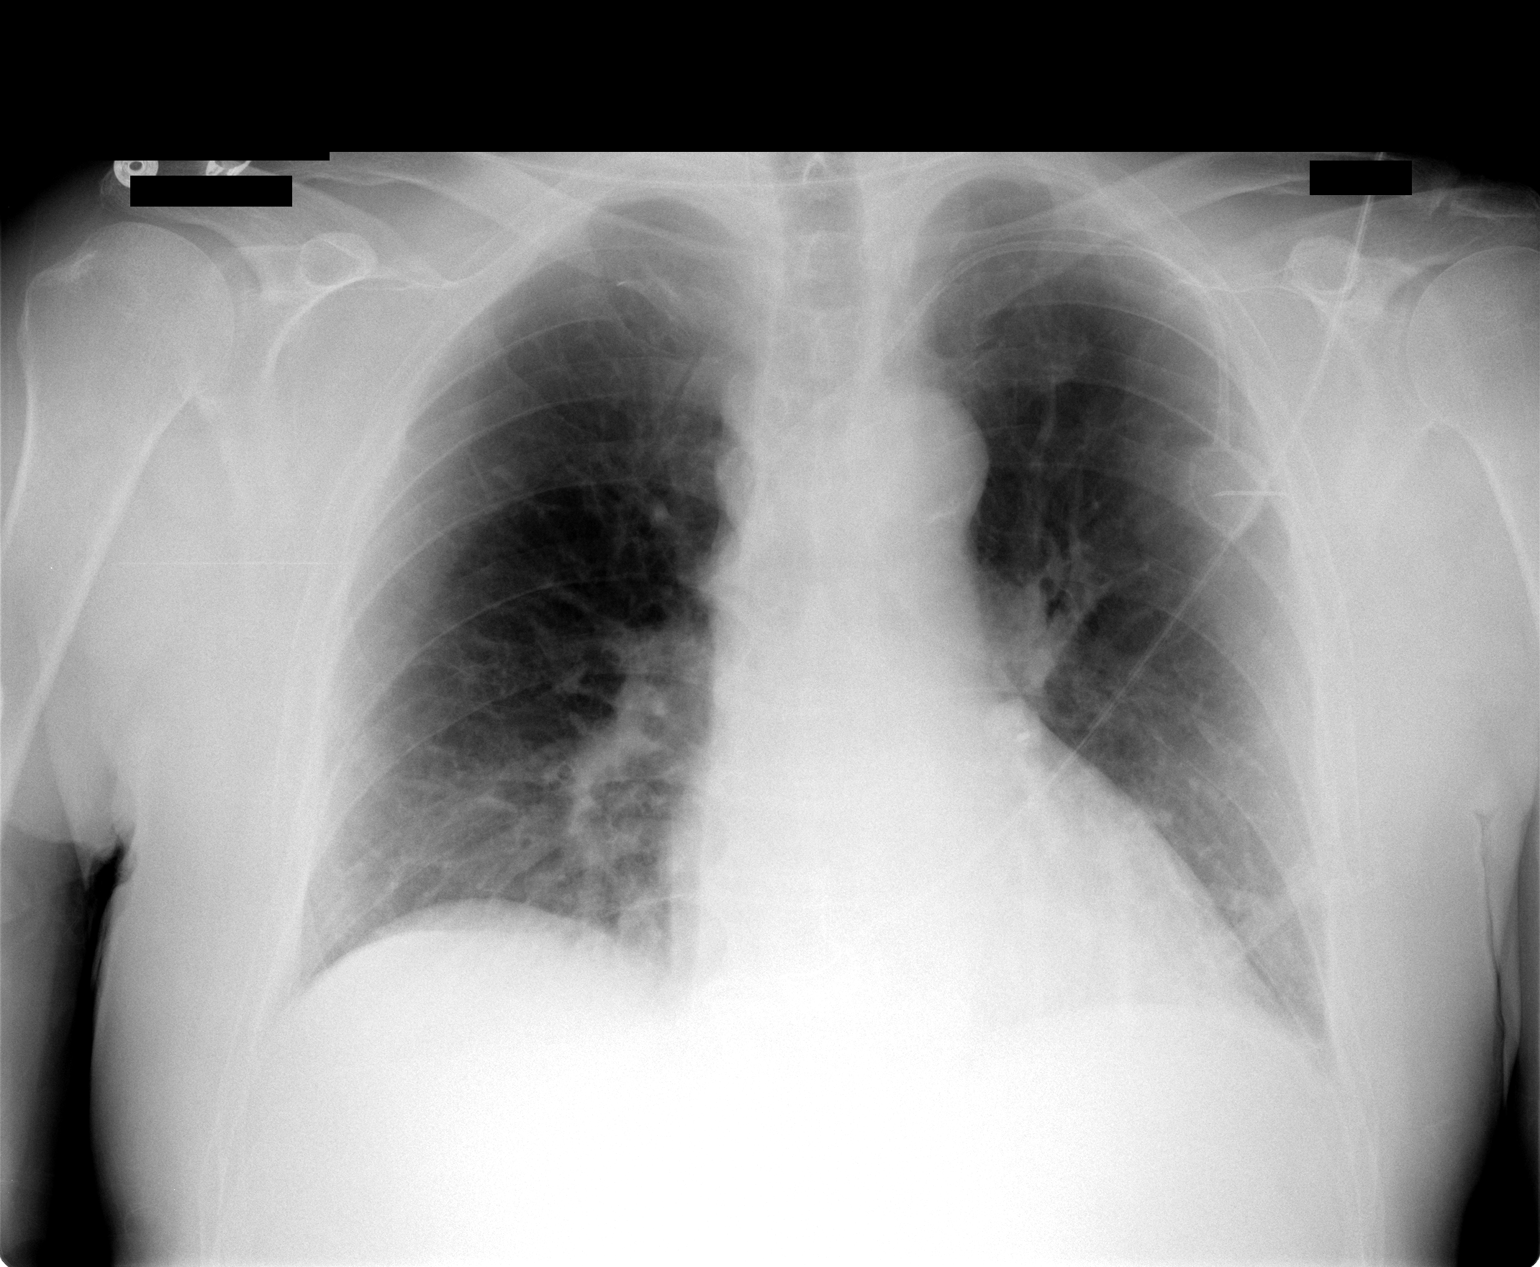

[1 of 1 positions shown; findings below may reference images not displayed]

FINDINGS: Cardiomediastinal silhouette is stable. No acute infiltrate or
pleural effusion. No pulmonary edema. There is subclavian
Port-A-Cath with tip in SVC. No pneumothorax.
IMPRESSION: No active disease.  Left subclavian Port-A-Cath in place.

## 2015-08-29 ENCOUNTER — Other Ambulatory Visit: Payer: Self-pay | Admitting: General Surgery

## 2015-08-29 DIAGNOSIS — Z95828 Presence of other vascular implants and grafts: Secondary | ICD-10-CM | POA: Diagnosis not present

## 2015-08-29 NOTE — H&P (Signed)
Matthew [name] 08/29/2015 9:18 AM Location: [facility] Patient #: [id number] DOB: 12-20-1945 Married / Language: English / Race: White Male History of Present Illness Matthew Ruff MD; 9/35/7017 9:50 AM) Patient words: discuss port removal.  The patient is a 69 year old male who presents with colorectal cancer. Pt is s/p lap colectomy ~1 year ago. He had a port placed for this. He is now done with his chemotherapy. Repeat CT scans and colonoscopy are negative for any signs of recurrence. He denies any complications with this port. Problem List/Past Medical Matthew Ruff, MD; 7/93/9030 9:51 AM) ABDOMINAL PAIN (R10.9) MALIGNANT NEOPLASM OF ASCENDING COLON (C18.2) Follow up in 6 months PORT-A-CATH IN PLACE (S92.330)  Other Problems Matthew Ruff, MD; 0/76/2263 9:51 AM) Back Pain Colon Cancer Anxiety Disorder Arthritis Gastroesophageal Reflux Disease  Past Surgical History Matthew Ruff, MD; 3/35/4562 9:51 AM) Vasectomy Spinal Surgery - Neck Colon Removal - Partial Spinal Surgery - Lower Back Colon Polyp Removal - Colonoscopy Colon Polyp Removal - Open  Diagnostic Studies History Matthew Ruff, MD; 5/63/8937 9:51 AM) Colonoscopy within last year  Allergies Matthew [name], CMA; 08/29/2015 9:19 AM) Ibuprofen *ANALGESICS - ANTI-INFLAMMATORY* Matthew [name] *Biologicals Misc  Medication History (Matthew [name], CMA; 08/29/2015 9:20 AM) SEROquel XR (400MG  Tablet ER 24HR, Oral) Active. Citalopram Hydrobromide (40MG  Tablet, Oral) Active. Colace (100MG  Capsule, Oral) Active. OxyCODONE HCl (15MG  Tablet, Oral as needed) Active. Cyclobenzaprine HCl (10MG  Tablet, Oral daily) Active. EPINEPHrine (0.3MG /0.3ML Soln Auto-inj, Injection as needed) Active. Lisinopril (5MG  Tablet, Oral daily) Active. Omeprazole (20MG  Capsule DR, Oral as needed) Active. Pravastatin Sodium (40MG  Tablet, Oral daily) Active. Tamsulosin HCl (0.4MG  Capsule, Oral  daily) Active. Medications Reconciled  Social History Matthew Ruff, MD; 3/42/8768 9:51 AM) Caffeine use Carbonated beverages, Coffee, Tea. Tobacco use Former smoker.     Review of Systems Matthew Ruff MD; 12/29/7260 9:50 AM) General Present- Fatigue. Not Present- Appetite Loss, Chills, Fever, Night Sweats, Weight Gain and Weight Loss. Skin Not Present- Change in Wart/Mole, Dryness, Hives, Jaundice, New Lesions, Non-Healing Wounds, Rash and Ulcer. HEENT Present- Wears glasses/contact lenses. Not Present- Earache, Hearing Loss, Hoarseness, Nose Bleed, Oral Ulcers, Ringing in the Ears, Seasonal Allergies, Sinus Pain, Sore Throat, Visual Disturbances and Yellow Eyes. Respiratory Not Present- Bloody sputum, Chronic Cough, Difficulty Breathing, Snoring and Wheezing. Breast Not Present- Breast Mass, Breast Pain, Nipple Discharge and Skin Changes. Cardiovascular Not Present- Chest Pain, Difficulty Breathing Lying Down, Leg Cramps, Palpitations, Rapid Heart Rate, Shortness of Breath and Swelling of Extremities. Gastrointestinal Present- Abdominal Pain. Not Present- Bloating, Bloody Stool, Change in Bowel Habits, Chronic diarrhea, Constipation, Difficulty Swallowing, Excessive gas, Gets full quickly at meals, Hemorrhoids, Indigestion, Nausea, Rectal Pain and Vomiting. Male Genitourinary Not Present- Blood in Urine, Change in Urinary Stream, Frequency, Impotence, Nocturia, Painful Urination, Urgency and Urine Leakage. Musculoskeletal Present- Back Pain and Joint Pain. Not Present- Joint Stiffness, Muscle Pain, Muscle Weakness and Swelling of Extremities. Neurological Not Present- Decreased Memory, Fainting, Headaches, Numbness, Seizures, Tingling, Tremor, Trouble walking and Weakness. Psychiatric Present- Bipolar. Not Present- Anxiety, Change in Sleep Pattern, Depression, Fearful and Frequent crying. Endocrine Present- Cold Intolerance. Not Present- Excessive Hunger, Hair Changes, Heat Intolerance  and New Diabetes. Hematology Present- Easy Bruising. Not Present- Excessive bleeding, Gland problems, HIV and Persistent Infections.  Vitals (Matthew [name] CMA; 08/29/2015 9:18 AM) 08/29/2015 9:18 AM Weight: 205.4 lb Height: 69in Body Surface Area: 2.13 m Body Mass Index: 30.33 kg/m Temp.: 97.33F(Oral)  Pulse: 84 (Regular)  BP: 112/80 (Sitting, Left Arm, Standard)  Physical Exam Matthew Ruff MD; 5/70/1779 9:51 AM)  General Mental Status-Alert. General Appearance-Consistent with stated age. Hydration-Well hydrated. Voice-Normal.  Head and Neck Head-normocephalic, atraumatic with no lesions or palpable masses. Trachea-midline. Thyroid Gland Characteristics - normal size and consistency.  Eye Eyeball - Bilateral-Extraocular movements intact. Sclera/Conjunctiva - Bilateral-No scleral icterus.  Chest and Lung Exam Chest and lung exam reveals -quiet, even and easy respiratory effort with no use of accessory muscles and on auscultation, normal breath sounds, no adventitious sounds and normal vocal resonance. Inspection Chest Wall - Normal. Back - normal.  Cardiovascular Cardiovascular examination reveals -normal heart sounds, regular rate and rhythm with no murmurs and normal pedal pulses bilaterally.  Abdomen Inspection Inspection of the abdomen reveals - No Hernias. Palpation/Percussion Palpation and Percussion of the abdomen reveal - Soft, Non Tender, No Rebound tenderness, No Rigidity (guarding) and No hepatosplenomegaly. Auscultation Auscultation of the abdomen reveals - Bowel sounds normal.  Neurologic Neurologic evaluation reveals -alert and oriented x 3 with no impairment of recent or remote memory. Mental Status-Normal.  Musculoskeletal Global Assessment -Note:no gross deformities.  Normal Exam - Left-Upper Extremity Strength Normal and Lower Extremity Strength Normal. Normal Exam - Right-Upper Extremity  Strength Normal and Lower Extremity Strength Normal.    Assessment & Plan Matthew Ruff MD; 3/90/3009 9:49 AM)  Charlann Lange IN PLACE (Q33.007) Impression: We will schedule removal of this in the operating room with minimal sedation. We discussed the risk of the procedure which are bleeding and infection. He has agreed to proceed.

## 2015-09-04 DIAGNOSIS — F4312 Post-traumatic stress disorder, chronic: Secondary | ICD-10-CM | POA: Diagnosis not present

## 2015-09-06 ENCOUNTER — Ambulatory Visit (HOSPITAL_BASED_OUTPATIENT_CLINIC_OR_DEPARTMENT_OTHER): Payer: Medicare Other

## 2015-09-06 VITALS — BP 141/85 | HR 98 | Temp 98.0°F | Resp 16

## 2015-09-06 DIAGNOSIS — Z452 Encounter for adjustment and management of vascular access device: Secondary | ICD-10-CM

## 2015-09-06 DIAGNOSIS — C180 Malignant neoplasm of cecum: Secondary | ICD-10-CM

## 2015-09-06 DIAGNOSIS — Z95828 Presence of other vascular implants and grafts: Secondary | ICD-10-CM

## 2015-09-06 MED ORDER — SODIUM CHLORIDE 0.9 % IJ SOLN
10.0000 mL | INTRAMUSCULAR | Status: DC | PRN
  Administered 2015-09-06: 10 mL via INTRAVENOUS
  Filled 2015-09-06: qty 10

## 2015-09-06 MED ORDER — HEPARIN SOD (PORK) LOCK FLUSH 100 UNIT/ML IV SOLN
500.0000 [IU] | Freq: Once | INTRAVENOUS | Status: AC
  Administered 2015-09-06: 500 [IU] via INTRAVENOUS
  Filled 2015-09-06: qty 5

## 2015-09-06 NOTE — Patient Instructions (Signed)

## 2015-10-10 ENCOUNTER — Encounter (HOSPITAL_BASED_OUTPATIENT_CLINIC_OR_DEPARTMENT_OTHER): Payer: Self-pay | Admitting: *Deleted

## 2015-10-10 NOTE — Progress Notes (Signed)
NPO AFTER MN.  ARRIVE AT [id number].  NEEDS HG. WILL TAKE AM MEDS W/ SIPS OF WATER AND IF NEEDED TAKE OXYCODONE.

## 2015-10-11 ENCOUNTER — Ambulatory Visit (HOSPITAL_BASED_OUTPATIENT_CLINIC_OR_DEPARTMENT_OTHER)
Admission: RE | Admit: 2015-10-11 | Discharge: 2015-10-11 | Disposition: A | Discharge status: Home / Self Care | Payer: Medicare Other | Source: Ambulatory Visit | Attending: General Surgery | Admitting: General Surgery

## 2015-10-11 ENCOUNTER — Ambulatory Visit (HOSPITAL_BASED_OUTPATIENT_CLINIC_OR_DEPARTMENT_OTHER): Payer: Medicare Other | Admitting: Anesthesiology

## 2015-10-11 ENCOUNTER — Encounter (HOSPITAL_BASED_OUTPATIENT_CLINIC_OR_DEPARTMENT_OTHER): Payer: Self-pay | Admitting: *Deleted

## 2015-10-11 ENCOUNTER — Encounter (HOSPITAL_BASED_OUTPATIENT_CLINIC_OR_DEPARTMENT_OTHER)
Admission: RE | Disposition: A | Discharge status: Home / Self Care | Payer: Self-pay | Source: Ambulatory Visit | Attending: General Surgery

## 2015-10-11 DIAGNOSIS — C189 Malignant neoplasm of colon, unspecified: Secondary | ICD-10-CM | POA: Diagnosis not present

## 2015-10-11 DIAGNOSIS — J449 Chronic obstructive pulmonary disease, unspecified: Secondary | ICD-10-CM | POA: Diagnosis not present

## 2015-10-11 DIAGNOSIS — M1990 Unspecified osteoarthritis, unspecified site: Secondary | ICD-10-CM | POA: Insufficient documentation

## 2015-10-11 DIAGNOSIS — Z79899 Other long term (current) drug therapy: Secondary | ICD-10-CM | POA: Insufficient documentation

## 2015-10-11 DIAGNOSIS — Z9221 Personal history of antineoplastic chemotherapy: Secondary | ICD-10-CM | POA: Diagnosis not present

## 2015-10-11 DIAGNOSIS — Z79891 Long term (current) use of opiate analgesic: Secondary | ICD-10-CM | POA: Diagnosis not present

## 2015-10-11 DIAGNOSIS — Z452 Encounter for adjustment and management of vascular access device: Secondary | ICD-10-CM | POA: Diagnosis not present

## 2015-10-11 DIAGNOSIS — K219 Gastro-esophageal reflux disease without esophagitis: Secondary | ICD-10-CM | POA: Diagnosis not present

## 2015-10-11 DIAGNOSIS — Z85038 Personal history of other malignant neoplasm of large intestine: Secondary | ICD-10-CM | POA: Insufficient documentation

## 2015-10-11 DIAGNOSIS — Z87891 Personal history of nicotine dependence: Secondary | ICD-10-CM | POA: Diagnosis not present

## 2015-10-11 HISTORY — DX: Emphysema, unspecified: J43.9

## 2015-10-11 HISTORY — DX: Personal history of other mental and behavioral disorders: Z86.59

## 2015-10-11 HISTORY — DX: Malignant neoplasm of colon, unspecified: C18.9

## 2015-10-11 HISTORY — DX: Cough, unspecified: R05.9

## 2015-10-11 HISTORY — PX: PORT-A-CATH REMOVAL: SHX5289

## 2015-10-11 HISTORY — DX: Diverticulosis of large intestine without perforation or abscess without bleeding: K57.30

## 2015-10-11 HISTORY — DX: Personal history of peptic ulcer disease: Z87.11

## 2015-10-11 HISTORY — DX: Dorsalgia, unspecified: M54.9

## 2015-10-11 HISTORY — DX: Other chronic pain: G89.29

## 2015-10-11 HISTORY — DX: Personal history of colonic polyps: Z86.010

## 2015-10-11 HISTORY — DX: Personal history of other diseases of the digestive system: Z87.19

## 2015-10-11 HISTORY — DX: Cough: R05

## 2015-10-11 HISTORY — DX: Post-traumatic stress disorder, unspecified: F43.10

## 2015-10-11 SURGERY — REMOVAL PORT-A-CATH
Anesthesia: Monitor Anesthesia Care | Site: Chest | Laterality: Left

## 2015-10-11 MED ORDER — FENTANYL CITRATE (PF) 100 MCG/2ML IJ SOLN
25.0000 ug | INTRAMUSCULAR | Status: DC | PRN
  Filled 2015-10-11: qty 1

## 2015-10-11 MED ORDER — MIDAZOLAM HCL 2 MG/2ML IJ SOLN
INTRAMUSCULAR | Status: AC
  Filled 2015-10-11: qty 2

## 2015-10-11 MED ORDER — 0.9 % SODIUM CHLORIDE (POUR BTL) OPTIME
TOPICAL | Status: DC | PRN
  Administered 2015-10-11: 500 mL

## 2015-10-11 MED ORDER — SODIUM CHLORIDE 0.9 % IJ SOLN
3.0000 mL | INTRAMUSCULAR | Status: DC | PRN
  Filled 2015-10-11: qty 3

## 2015-10-11 MED ORDER — PROPOFOL 500 MG/50ML IV EMUL
INTRAVENOUS | Status: DC | PRN
  Administered 2015-10-11: 25 ug/kg/min via INTRAVENOUS

## 2015-10-11 MED ORDER — LACTATED RINGERS IV SOLN
INTRAVENOUS | Status: DC
  Administered 2015-10-11: 09:00:00 via INTRAVENOUS
  Filled 2015-10-11: qty 1000

## 2015-10-11 MED ORDER — CEFAZOLIN SODIUM-DEXTROSE 2-3 GM-% IV SOLR
2.0000 g | INTRAVENOUS | Status: DC
  Filled 2015-10-11: qty 50

## 2015-10-11 MED ORDER — MIDAZOLAM HCL 5 MG/5ML IJ SOLN
INTRAMUSCULAR | Status: DC | PRN
  Administered 2015-10-11: 2 mg via INTRAVENOUS

## 2015-10-11 MED ORDER — LIDOCAINE HCL (CARDIAC) 20 MG/ML IV SOLN
INTRAVENOUS | Status: DC | PRN
  Administered 2015-10-11: 50 mg via INTRAVENOUS

## 2015-10-11 MED ORDER — CEFAZOLIN SODIUM-DEXTROSE 2-3 GM-% IV SOLR
INTRAVENOUS | Status: AC
  Filled 2015-10-11: qty 50

## 2015-10-11 MED ORDER — FENTANYL CITRATE (PF) 100 MCG/2ML IJ SOLN
INTRAMUSCULAR | Status: DC | PRN
  Administered 2015-10-11 (×2): 25 ug via INTRAVENOUS

## 2015-10-11 MED ORDER — SODIUM CHLORIDE 0.9 % IJ SOLN
3.0000 mL | Freq: Two times a day (BID) | INTRAMUSCULAR | Status: DC
  Filled 2015-10-11: qty 3

## 2015-10-11 MED ORDER — ACETAMINOPHEN 500 MG PO TABS
1000.0000 mg | ORAL_TABLET | Freq: Four times a day (QID) | ORAL | Status: DC
  Filled 2015-10-11: qty 2

## 2015-10-11 MED ORDER — FENTANYL CITRATE (PF) 100 MCG/2ML IJ SOLN
INTRAMUSCULAR | Status: AC
  Filled 2015-10-11: qty 2

## 2015-10-11 MED ORDER — CEFAZOLIN SODIUM-DEXTROSE 2-3 GM-% IV SOLR
2.0000 g | INTRAVENOUS | Status: AC
  Administered 2015-10-11: 2 g via INTRAVENOUS
  Filled 2015-10-11: qty 50

## 2015-10-11 MED ORDER — BUPIVACAINE HCL (PF) 0.25 % IJ SOLN
INTRAMUSCULAR | Status: DC | PRN
  Administered 2015-10-11: 9 mL

## 2015-10-11 MED ORDER — PROPOFOL 10 MG/ML IV BOLUS
INTRAVENOUS | Status: DC | PRN
  Administered 2015-10-11: 20 mg via INTRAVENOUS

## 2015-10-11 MED ORDER — MEPERIDINE HCL 25 MG/ML IJ SOLN
6.2500 mg | INTRAMUSCULAR | Status: DC | PRN
  Filled 2015-10-11: qty 1

## 2015-10-11 MED ORDER — PROMETHAZINE HCL 25 MG/ML IJ SOLN
6.2500 mg | INTRAMUSCULAR | Status: DC | PRN
  Filled 2015-10-11: qty 1

## 2015-10-11 MED ORDER — SODIUM CHLORIDE 0.9 % IV SOLN
250.0000 mL | INTRAVENOUS | Status: DC | PRN
  Filled 2015-10-11: qty 250

## 2015-10-11 SURGICAL SUPPLY — 34 items
BLADE HEX COATED 2.75 (ELECTRODE) ×3 IMPLANT
BLADE SURG 15 STRL LF DISP TIS (BLADE) ×1 IMPLANT
BLADE SURG 15 STRL SS (BLADE) ×2
CHLORAPREP W/TINT 26ML (MISCELLANEOUS) ×3 IMPLANT
COVER BACK TABLE 60X90IN (DRAPES) ×3 IMPLANT
COVER MAYO STAND STRL (DRAPES) ×3 IMPLANT
DECANTER SPIKE VIAL GLASS SM (MISCELLANEOUS) IMPLANT
DRAPE PED LAPAROTOMY (DRAPES) ×3 IMPLANT
DRAPE UTILITY XL STRL (DRAPES) ×3 IMPLANT
ELECT REM PT RETURN 9FT ADLT (ELECTROSURGICAL) ×3
ELECTRODE REM PT RTRN 9FT ADLT (ELECTROSURGICAL) ×1 IMPLANT
GLOVE BIO SURGEON STRL SZ 6.5 (GLOVE) ×2 IMPLANT
GLOVE BIO SURGEONS STRL SZ 6.5 (GLOVE) ×1
GLOVE BIOGEL PI IND STRL 7.0 (GLOVE) ×1 IMPLANT
GLOVE BIOGEL PI INDICATOR 7.0 (GLOVE) ×2
GOWN STRL REUS W/ TWL LRG LVL3 (GOWN DISPOSABLE) ×1 IMPLANT
GOWN STRL REUS W/TWL 2XL LVL3 (GOWN DISPOSABLE) ×3 IMPLANT
GOWN STRL REUS W/TWL LRG LVL3 (GOWN DISPOSABLE) ×2
LIQUID BAND (GAUZE/BANDAGES/DRESSINGS) ×3 IMPLANT
NEEDLE HYPO 25X1 1.5 SAFETY (NEEDLE) ×3 IMPLANT
NS IRRIG 500ML POUR BTL (IV SOLUTION) ×3 IMPLANT
PACK BASIN DAY SURGERY FS (CUSTOM PROCEDURE TRAY) ×3 IMPLANT
PENCIL BUTTON HOLSTER BLD 10FT (ELECTRODE) ×3 IMPLANT
SLEEVE SCD COMPRESS KNEE MED (MISCELLANEOUS) ×3 IMPLANT
SPONGE LAP 4X18 X RAY DECT (DISPOSABLE) ×3 IMPLANT
SUT VIC AB 2-0 SH 18 (SUTURE) ×3 IMPLANT
SUT VIC AB 3-0 SH 27 (SUTURE) ×2
SUT VIC AB 3-0 SH 27X BRD (SUTURE) ×1 IMPLANT
SUT VICRYL 4-0 PS2 18IN ABS (SUTURE) ×3 IMPLANT
SYR CONTROL 10ML LL (SYRINGE) ×3 IMPLANT
TOWEL OR 17X24 6PK STRL BLUE (TOWEL DISPOSABLE) ×6 IMPLANT
TUBE CONNECTING 12'X1/4 (SUCTIONS) ×1
TUBE CONNECTING 12X1/4 (SUCTIONS) ×2 IMPLANT
YANKAUER SUCT BULB TIP NO VENT (SUCTIONS) ×3 IMPLANT

## 2015-10-11 NOTE — Anesthesia Preprocedure Evaluation (Signed)
Anesthesia Evaluation  Patient identified by MRN, date of birth, ID band Patient awake    Reviewed: Allergy & Precautions, NPO status , Patient's Chart, lab work & pertinent test results  Airway Mallampati: II  TM Distance: >3 FB Neck ROM: Full    Dental no notable dental hx.    Pulmonary neg pulmonary ROS, COPD, former smoker,    Pulmonary exam normal breath sounds clear to auscultation       Cardiovascular negative cardio ROS Normal cardiovascular exam Rhythm:Regular Rate:Normal     Neuro/Psych negative neurological ROS  negative psych ROS   GI/Hepatic Neg liver ROS, GERD  Medicated and Controlled,  Endo/Other  negative endocrine ROS  Renal/GU negative Renal ROS  negative genitourinary   Musculoskeletal negative musculoskeletal ROS (+)   Abdominal   Peds negative pediatric ROS (+)  Hematology negative hematology ROS (+)   Anesthesia Other Findings   Reproductive/Obstetrics negative OB ROS                             Anesthesia Physical Anesthesia Plan  ASA: II  Anesthesia Plan: General   Post-op Pain Management:    Induction: Intravenous  Airway Management Planned: LMA  Additional Equipment:   Intra-op Plan:   Post-operative Plan: Extubation in OR  Informed Consent: I have reviewed the patients History and Physical, chart, labs and discussed the procedure including the risks, benefits and alternatives for the proposed anesthesia with the patient or authorized representative who has indicated his/her understanding and acceptance.   Dental advisory given  Plan Discussed with: CRNA  Anesthesia Plan Comments:         Anesthesia Quick Evaluation

## 2015-10-11 NOTE — H&P (Signed)
The patient is a 69 year old [name] who presents with colorectal cancer. Pt is s/p lap colectomy ~1 year ago. He had a port placed for this. He is now done with his chemotherapy. Repeat CT scans and colonoscopy are negative for any signs of recurrence. He denies any complications with this port. Problem List/Past Medical Matthew Ruff, MD; 1/61/0960 9:51 AM)   ABDOMINAL PAIN (R10.9) MALIGNANT NEOPLASM OF ASCENDING COLON (C18.2)  PORT-A-CATH IN PLACE (A54.098)  Other Problems Matthew Ruff, MD; 01/02/1477 9:51 AM) Back Pain Colon Cancer Anxiety Disorder Arthritis Gastroesophageal Reflux Disease  Past Surgical History Matthew Ruff, MD; 2/95/6213 9:51 AM) Vasectomy Spinal Surgery - Neck Colon Removal - Partial Spinal Surgery - Lower Back Colon Polyp Removal - Colonoscopy Colon Polyp Removal - Open  Diagnostic Studies History Matthew Ruff, MD; 0/86/5784 9:51 AM) Colonoscopy within last year  Allergies Matthew [name], CMA; 08/29/2015 9:19 AM) Ibuprofen *ANALGESICS - ANTI-INFLAMMATORY* [employer] Venom *Biologicals Misc  Medication History (Matthew [name], CMA; 08/29/2015 9:20 AM) SEROquel XR (400MG  Tablet ER 24HR, Oral) Active. Citalopram Hydrobromide (40MG  Tablet, Oral) Active. Colace (100MG  Capsule, Oral) Active. OxyCODONE HCl (15MG  Tablet, Oral as needed) Active. Cyclobenzaprine HCl (10MG  Tablet, Oral daily) Active. EPINEPHrine (0.3MG /0.3ML Soln Auto-inj, Injection as needed) Active. Lisinopril (5MG  Tablet, Oral daily) Active. Omeprazole (20MG  Capsule DR, Oral as needed) Active. Pravastatin Sodium (40MG  Tablet, Oral daily) Active. Tamsulosin HCl (0.4MG  Capsule, Oral daily) Active. Medications Reconciled  Social History Matthew Ruff, MD; 6/96/2952 9:51 AM) Caffeine use Carbonated beverages, Coffee, Tea. Tobacco use Former smoker.     Review of Systems Matthew Ruff MD; 8/41/3244 9:50 AM) General Present- Fatigue. Not Present-  Appetite Loss, Chills, Fever, Night Sweats, Weight Gain and Weight Loss. Skin Not Present- Change in Wart/Mole, Dryness, Hives, Jaundice, New Lesions, Non-Healing Wounds, Rash and Ulcer. HEENT Present- Wears glasses/contact lenses. Not Present- Earache, Hearing Loss, Hoarseness, Nose Bleed, Oral Ulcers, Ringing in the Ears, Seasonal Allergies, Sinus Pain, Sore Throat, Visual Disturbances and Yellow Eyes. Respiratory Not Present- Bloody sputum, Chronic Cough, Difficulty Breathing, Snoring and Wheezing. Breast Not Present- Breast Mass, Breast Pain, Nipple Discharge and Skin Changes. Cardiovascular Not Present- Chest Pain, Difficulty Breathing Lying Down, Leg Cramps, Palpitations, Rapid Heart Rate, Shortness of Breath and Swelling of Extremities. Gastrointestinal Present- Abdominal Pain. Not Present- Bloating, Bloody Stool, Change in Bowel Habits, Chronic diarrhea, Constipation, Difficulty Swallowing, Excessive gas, Gets full quickly at meals, Hemorrhoids, Indigestion, Nausea, Rectal Pain and Vomiting. [name] Genitourinary Not Present- Blood in Urine, Change in Urinary Stream, Frequency, Impotence, Nocturia, Painful Urination, Urgency and Urine Leakage. Musculoskeletal Present- Back Pain and Joint Pain. Not Present- Joint Stiffness, Muscle Pain, Muscle Weakness and Swelling of Extremities. Neurological Not Present- Decreased Memory, Fainting, Headaches, Numbness, Seizures, Tingling, Tremor, Trouble walking and Weakness. Psychiatric Present- Bipolar. Not Present- Anxiety, Change in Sleep Pattern, Depression, Fearful and Frequent crying. Endocrine Present- Cold Intolerance. Not Present- Excessive Hunger, Hair Changes, Heat Intolerance and New Diabetes. Hematology Present- Easy Bruising. Not Present- Excessive bleeding, Gland problems, HIV and Persistent Infections.  BP 139/87 mmHg  Pulse 113  Temp(Src) 97.5 F (36.4 C) (Oral)  Resp 16  Ht 5\' 9"  (1.753 m)  Wt 92.987 kg (205 lb)  BMI Matthew.26 kg/m2  SpO2  95%     Physical Exam  General Mental Status-Alert. General Appearance-Consistent with stated age. Hydration-Well hydrated. Voice-Normal.  Head and Neck Head-normocephalic, atraumatic with no lesions or palpable masses. Trachea-midline. Thyroid Gland Characteristics - normal size and consistency.  Eye Eyeball - Bilateral-Extraocular movements intact. Sclera/Conjunctiva -  Bilateral-No scleral icterus.  Chest and Lung Exam Chest and lung exam reveals -quiet, even and easy respiratory effort with no use of accessory muscles and on auscultation, normal breath sounds, no adventitious sounds and normal vocal resonance. Inspection Chest Wall - Normal. Back - normal.  Cardiovascular Cardiovascular examination reveals -normal heart sounds, regular rate and rhythm with no murmurs and normal pedal pulses bilaterally.  Abdomen Inspection Inspection of the abdomen reveals - No Hernias. Palpation/Percussion Palpation and Percussion of the abdomen reveal - Soft, Non Tender, No Rebound tenderness, No Rigidity (guarding) and No hepatosplenomegaly. Auscultation Auscultation of the abdomen reveals - Bowel sounds normal.  Neurologic Neurologic evaluation reveals -alert and oriented x 3 with no impairment of recent or remote memory. Mental Status-Normal.  Musculoskeletal Global Assessment -Note:no gross deformities.  Normal Exam - Left-Upper Extremity Strength Normal and Lower Extremity Strength Normal. Normal Exam - Right-Upper Extremity Strength Normal and Lower Extremity Strength Normal.    Assessment & Plan   PORT-A-CATH IN PLACE (K86.381) Impression: We will schedule removal of this in the operating room with minimal sedation. We discussed the risk of the procedure which are bleeding and infection. He has agreed to proceed.

## 2015-10-11 NOTE — Discharge Instructions (Addendum)
GENERAL SURGERY: POST OP INSTRUCTIONS  1. DIET: Follow a light bland diet the first 24 hours after arrival home, such as soup, liquids, crackers, etc.  Be sure to include lots of fluids daily.  Avoid fast food or heavy meals as your are more likely to get nauseated.   2. Take your usually prescribed home medications unless otherwise directed. 3. PAIN CONTROL: a. Pain is best controlled by a usual combination of three different methods TOGETHER: i. Ice/Heat ii. Over the counter pain medication  b. Most patients will experience some swelling and bruising around the incisions.  Ice packs or heating pads (30-60 minutes up to 6 times a day) will help. Use ice for the first few days to help decrease swelling and bruising, then switch to heat to help relax tight/sore spots and speed recovery.  Some people prefer to use ice alone, heat alone, alternating between ice & heat.  Experiment to what works for you.  Swelling and bruising can take several weeks to resolve.   c. It is helpful to take an over-the-counter pain medication regularly for the first few weeks.  Choose one of the following that works best for you: i. Naproxen (Aleve, etc)  Two 220mg  tabs twice a day ii. Ibuprofen (Advil, etc) Three 200mg  tabs four times a day (every meal & bedtime) iii. If you are having problems/concerns with the prescription medicine (does not control pain, nausea, vomiting, rash, itching, etc), please call us [phone] to see if we need to switch you to a different pain medicine that will work better for you and/or control your side effect better.  4. Avoid getting constipated.  Between the surgery and the pain medications, it is common to experience some constipation.  Increasing fluid intake and taking a fiber supplement (such as Metamucil, Citrucel, FiberCon, MiraLax, etc) 1-2 times a day regularly will usually help prevent this problem from occurring.  A mild laxative (prune juice, Milk of Magnesia, MiraLax, etc)  should be taken according to package directions if there are no bowel movements after 48 hours.  Do not strain for the first 48 hours after surgery  5. Wash / shower every day.  You may shower over the dressings as they are waterproof.  Continue to shower over incision(s) after the dressing is off. 6.  You may leave the incision open to air.  You may place a dressing/Band-Aid to cover the incision for comfort if you wish.  7. ACTIVITIES as tolerated:   a. You may resume regular (light) daily activities beginning the next day--such as daily self-care, walking, climbing stairs--gradually increasing activities as tolerated.  If you can walk 30 minutes without difficulty, it is safe to try more intense activity such as jogging, treadmill, bicycling, low-impact aerobics, swimming, etc. b. Save the most intensive and strenuous activity for last such as sit-ups, heavy lifting, contact sports, etc  Refrain from any heavy lifting or straining until you are off narcotics for pain control.   c. DO NOT PUSH THROUGH PAIN.  Let pain be your guide: If it hurts to do something, don't do it.  Pain is your body warning you to avoid that activity for another week until the pain goes down. d. You may drive when you are no longer taking prescription pain medication, you can comfortably wear a seatbelt, and you can safely maneuver your car and apply brakes. e. Dennis Bast may have sexual intercourse when it is comfortable.  8. FOLLOW UP in our office as needed for wound  issues a. Please call [facility] at [phone] to set up an appointment. b. Make sure that you call for this appointment the day you arrive home to insure a convenient appointment time. 9. IF YOU HAVE DISABILITY OR FAMILY LEAVE FORMS, BRING THEM TO THE OFFICE FOR PROCESSING.  DO NOT GIVE THEM TO YOUR DOCTOR.   WHEN TO CALL us [phone]: 1. Poor pain control 2. Reactions / problems with new medications (rash/itching, nausea, etc)  3. Fever over 101.5 F (38.5  C) 4. Worsening swelling or bruising 5. Continued bleeding from incision. 6. Increased pain, redness, or drainage from the incision   The clinic staff is available to answer your questions during regular business hours (8:30am-5pm).  Please dont hesitate to call and ask to speak to one of our nurses for clinical concerns.   If you have a medical emergency, go to the nearest emergency room or call 911.  A surgeon from [facility] is always on call at the [facility], [address], [address], [place], [place]  [address] ? MAIN: [phone] ? TOLL FREE: [phone] ?  FAX [phone] [email or web address]     Post Anesthesia Home Care Instructions  Activity: Get plenty of rest for the remainder of the day. A responsible adult should stay with you for 24 hours following the procedure.  For the next 24 hours, DO NOT: -Drive a car -Paediatric nurse -Drink alcoholic beverages -Take any medication unless instructed by your physician -Make any legal decisions or sign important papers.  Meals: Start with liquid foods such as gelatin or soup. Progress to regular foods as tolerated. Avoid greasy, spicy, heavy foods. If nausea and/or vomiting occur, drink only clear liquids until the nausea and/or vomiting subsides. Call your physician if vomiting continues.  Special Instructions/Symptoms: Your throat may feel dry or sore from the anesthesia or the breathing tube placed in your throat during surgery. If this causes discomfort, gargle with warm salt water. The discomfort should disappear within 24 hours.  If you had a scopolamine patch placed behind your ear for the management of post- operative nausea and/or vomiting:  1. The medication in the patch is effective for 72 hours, after which it should be removed.  Wrap patch in a tissue and discard in the trash. Wash hands thoroughly with soap and water. 2. You may remove the  patch earlier than 72 hours if you experience unpleasant side effects which may include dry mouth, dizziness or visual disturbances. 3. Avoid touching the patch. Wash your hands with soap and water after contact with the patch.

## 2015-10-11 NOTE — Transfer of Care (Signed)
Last Vitals:  Filed Vitals:   10/11/15 0801  BP: 139/87  Pulse: 113  Temp: 36.4 C  Resp: 16   Immediate Anesthesia Transfer of Care Note  Patient: Matthew [name]  Procedure(s) Performed: Procedure(s) (LRB): REMOVAL PORT-A-CATH (Left)  Patient Location: PACU  Anesthesia Type: MAC  Level of Consciousness: awake, alert  and oriented  Airway & Oxygen Therapy: Patient Spontanous Breathing and Patient connected to nasal cannula oxygen  Post-op Assessment: Report given to PACU RN and Post -op Vital signs reviewed and stable  Post vital signs: Reviewed and stable  Complications: No apparent anesthesia complications

## 2015-10-11 NOTE — Op Note (Signed)
10/11/2015  10:33 AM  PATIENT:  Matthew [name]  69 y.o. male  Patient Care Team: Cleophas Dunker, MD as PCP - General (Nurse Practitioner)  PRE-OPERATIVE DIAGNOSIS:  stage III colon cancer  POST-OPERATIVE DIAGNOSIS:  stage III colon cancer  PROCEDURE:  Procedure(s): REMOVAL PORT-A-CATH  SURGEON:  Surgeon(s): [name], MD  ASSISTANT: none   ANESTHESIA:   local and MAC  EBL:     DRAINS: none   SPECIMEN:  Source of Specimen:  port a cath (gross only)  DISPOSITION OF SPECIMEN:  PATHOLOGY  COUNTS:  YES  PLAN OF CARE: Discharge to home after PACU  PATIENT DISPOSITION:  PACU - hemodynamically stable.  INDICATION: 69 y.o. M with colorectal cancer.  He has completed his therapy and is here for port removal.     OR FINDINGS: Normal appearing port a cath, no signs of infection  DESCRIPTION: the patient was identified in the preoperative holding area and taken to the OR where they were laid supine on the operating room table.  MAC anesthesia was induced without difficulty. SCDs were also noted to be in place prior to the initiation of anesthesia.  The patient was then prepped and draped in the usual sterile fashion.   A surgical timeout was performed indicating the correct patient, procedure, positioning and need for preoperative antibiotics. A field block was performed using 1% Marcaine.  The previous scar was incised using a 15 blade scalpel. Dissection was carried down through the subcutaneous tissues using electrocautery. The port was identified and the capsule was divided using electrocautery. The 3 sutures were cut using scissors. The port was then removed from the subcutaneous pocket. A figure-of-eight suture was placed around the port entrance site using a 2-0 Vicryl suture. The patient was placed in Trendelenburg and the port was removed. The suture was tied over the opening. The capsule was then closed using a running 2-0 Vicryl suture. The skin was closed using a  running 4-0 subcuticular suture. Dermabond was placed over this. The patient was then awakened from anesthesia and sent to the postanesthesia care unit in stable condition. All counts were correct per operating room staff.

## 2015-10-11 NOTE — Anesthesia Procedure Notes (Signed)
Procedure Name: MAC Performed by: Mechele Claude Pre-anesthesia Checklist: Patient identified, Timeout performed, Emergency Drugs available, Suction available and Patient being monitored Patient Re-evaluated:Patient Re-evaluated prior to inductionOxygen Delivery Method: Nasal cannula Placement Confirmation: positive ETCO2

## 2015-10-11 NOTE — Anesthesia Postprocedure Evaluation (Signed)
  Anesthesia Post-op Note  Patient: Matthew [name]  Procedure(s) Performed: Procedure(s) (LRB): REMOVAL PORT-A-CATH (Left)  Patient Location: PACU  Anesthesia Type: MAC  Level of Consciousness: awake and alert   Airway and Oxygen Therapy: Patient Spontanous Breathing  Post-op Pain: mild  Post-op Assessment: Post-op Vital signs reviewed, Patient's Cardiovascular Status Stable, Respiratory Function Stable, Patent Airway and No signs of Nausea or vomiting  Last Vitals:  Filed Vitals:   10/11/15 1135  BP: 117/77  Pulse: 90  Temp: 36.6 C  Resp: 16    Post-op Vital Signs: stable   Complications: No apparent anesthesia complications

## 2015-10-12 ENCOUNTER — Encounter (HOSPITAL_BASED_OUTPATIENT_CLINIC_OR_DEPARTMENT_OTHER): Payer: Self-pay | Admitting: General Surgery

## 2015-10-31 ENCOUNTER — Telehealth: Payer: Self-pay | Admitting: Oncology

## 2015-10-31 NOTE — Telephone Encounter (Signed)
returned call and lvm to confirm that all flush appt cx due to port being taken out

## 2015-11-06 DIAGNOSIS — F4312 Post-traumatic stress disorder, chronic: Secondary | ICD-10-CM | POA: Diagnosis not present

## 2015-11-21 DIAGNOSIS — F4312 Post-traumatic stress disorder, chronic: Secondary | ICD-10-CM | POA: Diagnosis not present

## 2015-11-30 ENCOUNTER — Telehealth: Payer: Self-pay | Admitting: *Deleted

## 2015-11-30 NOTE — Telephone Encounter (Signed)
Pt called requesting to speak with either Lattie Haw, NP or Dr. Benay Spice.  Pt stated he is VN Vet, and is working with the [place] about his cancer condition could be causing by agent orange from previous exposure. Pt would like a letter from Dr. Benay Spice stating that his cancer could be related to exposure to agent orange.  Message sent to md for review. Pt's  Phone    [phone].

## 2015-12-02 NOTE — Telephone Encounter (Signed)
He can request records be sent to [facility] They determine which cancers are potentially related to agent orange  I am not aware of an association

## 2015-12-04 NOTE — Telephone Encounter (Signed)
Spoke with pt and informed him of Dr. Gearldine Shown instructions.   Pt voiced understanding.

## 2015-12-05 DIAGNOSIS — F4312 Post-traumatic stress disorder, chronic: Secondary | ICD-10-CM | POA: Diagnosis not present

## 2015-12-27 ENCOUNTER — Ambulatory Visit: Payer: Medicare Other | Admitting: Oncology

## 2015-12-27 ENCOUNTER — Other Ambulatory Visit: Payer: Medicare Other

## 2015-12-27 DIAGNOSIS — F4312 Post-traumatic stress disorder, chronic: Secondary | ICD-10-CM | POA: Diagnosis not present

## 2016-01-28 DIAGNOSIS — F4312 Post-traumatic stress disorder, chronic: Secondary | ICD-10-CM | POA: Diagnosis not present

## 2016-01-29 DIAGNOSIS — Z85038 Personal history of other malignant neoplasm of large intestine: Secondary | ICD-10-CM | POA: Diagnosis not present

## 2016-02-22 ENCOUNTER — Other Ambulatory Visit: Payer: Self-pay | Admitting: *Deleted

## 2016-02-25 ENCOUNTER — Telehealth: Payer: Self-pay | Admitting: Oncology

## 2016-02-25 NOTE — Telephone Encounter (Signed)
lvm for pt regarding to missed appt....advised to call back to sched

## 2016-02-27 DIAGNOSIS — F4312 Post-traumatic stress disorder, chronic: Secondary | ICD-10-CM | POA: Diagnosis not present

## 2016-03-13 ENCOUNTER — Encounter: Payer: Self-pay | Admitting: Oncology

## 2016-08-13 DIAGNOSIS — Z85038 Personal history of other malignant neoplasm of large intestine: Secondary | ICD-10-CM | POA: Diagnosis not present

## 2016-08-14 ENCOUNTER — Other Ambulatory Visit: Payer: Self-pay | Admitting: General Surgery

## 2016-08-14 DIAGNOSIS — Z85038 Personal history of other malignant neoplasm of large intestine: Secondary | ICD-10-CM

## 2016-08-26 ENCOUNTER — Ambulatory Visit
Admission: RE | Admit: 2016-08-26 | Discharge: 2016-08-26 | Disposition: A | Discharge status: Home / Self Care | Payer: Medicare Other | Source: Ambulatory Visit | Attending: General Surgery | Admitting: General Surgery

## 2016-08-26 DIAGNOSIS — C189 Malignant neoplasm of colon, unspecified: Secondary | ICD-10-CM | POA: Diagnosis not present

## 2016-08-26 DIAGNOSIS — Z85038 Personal history of other malignant neoplasm of large intestine: Secondary | ICD-10-CM

## 2016-08-26 MED ORDER — IOPAMIDOL (ISOVUE-300) INJECTION 61%
100.0000 mL | Freq: Once | INTRAVENOUS | Status: AC | PRN
  Administered 2016-08-26: 100 mL via INTRAVENOUS

## 2016-12-11 ENCOUNTER — Other Ambulatory Visit: Payer: Self-pay | Admitting: Nurse Practitioner

## 2017-02-10 DIAGNOSIS — Z85038 Personal history of other malignant neoplasm of large intestine: Secondary | ICD-10-CM | POA: Diagnosis not present

## 2017-06-08 DIAGNOSIS — L814 Other melanin hyperpigmentation: Secondary | ICD-10-CM | POA: Diagnosis not present

## 2017-06-08 DIAGNOSIS — L821 Other seborrheic keratosis: Secondary | ICD-10-CM | POA: Diagnosis not present

## 2017-06-08 DIAGNOSIS — D225 Melanocytic nevi of trunk: Secondary | ICD-10-CM | POA: Diagnosis not present

## 2017-07-17 ENCOUNTER — Encounter (HOSPITAL_COMMUNITY): Payer: Self-pay | Admitting: *Deleted

## 2017-07-17 ENCOUNTER — Ambulatory Visit (HOSPITAL_COMMUNITY)
Admission: EM | Admit: 2017-07-17 | Discharge: 2017-07-17 | Disposition: A | Discharge status: Home / Self Care | Payer: Medicare Other | Attending: Emergency Medicine | Admitting: Emergency Medicine

## 2017-07-17 DIAGNOSIS — H538 Other visual disturbances: Secondary | ICD-10-CM

## 2017-07-17 DIAGNOSIS — H04123 Dry eye syndrome of bilateral lacrimal glands: Secondary | ICD-10-CM | POA: Diagnosis not present

## 2017-07-17 LAB — POCT I-STAT, CHEM 8
BUN: 28 mg/dL — AB (ref 6–20)
CALCIUM ION: 1.18 mmol/L (ref 1.15–1.40)
CHLORIDE: 105 mmol/L (ref 101–111)
CREATININE: 1.3 mg/dL — AB (ref 0.61–1.24)
Glucose, Bld: 163 mg/dL — ABNORMAL HIGH (ref 65–99)
HCT: 43 % (ref 39.0–52.0)
Hemoglobin: 14.6 g/dL (ref 13.0–17.0)
Potassium: 4.5 mmol/L (ref 3.5–5.1)
SODIUM: 140 mmol/L (ref 135–145)
TCO2: 24 mmol/L (ref 0–100)

## 2017-07-17 MED ORDER — TETRACAINE HCL 0.5 % OP SOLN
OPHTHALMIC | Status: AC
  Filled 2017-07-17: qty 4

## 2017-07-17 NOTE — ED Triage Notes (Signed)
19  Days  Ago  Pt  Was   Working  Outside  And       Got       Weak    And   Developed  Blurred   Vision   Still  Has   Some  Blurred  Vision  Seeing   Double  In l  [employer]  As   Well  Both  Eyes     Alert  And  Oriented       Some  Photo  Sensitivity

## 2017-07-17 NOTE — ED Provider Notes (Signed)
HPI  SUBJECTIVE:  Matthew [name] is a 71 y.o. male who presents with bilateral constant blurry vision described as double vision. States it started acutely while he was [place] 6 days ago. States that he became very hot, had an episode of emesis followed by this visual change. States the left eye is worse than the right. He reports mild photophobia. No face or arm, leg weakness, slurred speech, aphasia, visual loss. No nausea, vomiting, fevers, headaches. No antipyretic in past 6-8 hours. No eye pain. No pain with extraocular movements. He has never had symptoms like this before. He does not wear glasses or contacts but uses readers. He is unsure what his most recent visual acuity is but states that his vision is "good". There are no alleviating factors. He has not tried anything for this. Symptoms are worse with exposure leg, trying to focus. Past medical history of diabetes states that glucose 96-200 is normal for him. Also colon cancer, hypertension, chronic back pain. No history stroke, aneurysm, eye problems, kidney disease. Family history negative for stroke per chart. WSF:KCLEXNT, Vickii Chafe, MD   Past Medical History:  Diagnosis Date  . Anxiety   . Arthritis   . Bipolar 1 disorder ([place])   . Chronic back pain   . 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 Complication of anesthesia    "I GET REAL COLD AND CAN'T URINATE"---  urinary retention  . COPD with emphysema ([place])   . Cough   . Diverticulosis of colon   . GERD (gastroesophageal reflux disease)   . History of adenomatous polyp of colon   . History of bleeding peptic ulcer    2000  . History of diverticulitis of colon   . History of panic attacks   . Hyperlipidemia   . Osteoporosis   . PTSD (post-traumatic stress disorder)    [place]  . Rectus diastasis   . Wears dentures    upper    Past Surgical History:  Procedure Laterality Date  . ANTERIOR CERVICAL DECOMP/DISCECTOMY FUSION  04-18-2003   C5 -- 6  . APPENDECTOMY  yrs ago  . BACK SURGERY  [phone]   lumbar lam and fusions  . CIRCUMCISION  03-05-2005  . COLONOSCOPY  [id number]  . LAPAROSCOPIC PARTIAL COLECTOMY N/A 06/30/2014   Procedure: LAPAROSCOPIC right COLECTOMY;  Surgeon: [name], MD;  Location: [facility];  Service: General;  Laterality: N/A;  . PORT-A-CATH REMOVAL Left 10/11/2015   Procedure: REMOVAL PORT-A-CATH;  Surgeon: [name], MD;  [facility];  Service: General;  Laterality: Left;  . PORTACATH PLACEMENT Left 07/31/2014   Procedure: INSERTION PORT-A-CATH;  Surgeon: Stark Klein, MD;  Location: [facility];  Service: General;  Laterality: Left;  . UPPER GI ENDOSCOPY  2015    Family History  Problem Relation Age of Onset  . Heart disease Mother     Social History  Substance Use Topics  . Smoking status: Former Smoker    Packs/day: 1.00    Years: 30.00    Types: Cigarettes    Quit date: 06/23/2004  . Smokeless tobacco: Never Used  . Alcohol use No    No current facility-administered medications for this encounter.   Current Outpatient Prescriptions:  .  albuterol (PROVENTIL HFA;VENTOLIN HFA) 108 (90 BASE) MCG/ACT inhaler, Inhale into the lungs every 6 (six) hours as needed for wheezing or  shortness of breath., Disp: , Rfl:  .  citalopram (CELEXA) 40 MG tablet, Take 40 mg by mouth at bedtime., Disp: , Rfl:  .  cyclobenzaprine (FLEXERIL) 10 MG tablet, Take 10 mg by mouth 3 (three) times daily. , Disp: , Rfl:  .  DM-Phenylephrine-Acetaminophen 10-5-325 MG/15ML LIQD, Take by mouth as needed., Disp: , Rfl:  .  docusate sodium (COLACE) 100 MG capsule, Take 200 mg by mouth at bedtime., Disp: , Rfl:  .  EPINEPHrine 0.3 mg/0.3 mL IJ SOAJ injection, Inject 0.3 mg into the muscle daily as needed (allergic reaction)., Disp: , Rfl:  .  ergocalciferol  (VITAMIN D2) 50000 UNITS capsule, Take 50,000 Units by mouth 2 (two) times a week., Disp: , Rfl:  .  gabapentin (NEURONTIN) 300 MG capsule, Take 300 mg by mouth 3 (three) times daily., Disp: , Rfl:  .  Multiple Vitamin (MULTIVITAMIN) tablet, Take 1 tablet by mouth daily., Disp: , Rfl:  .  omeprazole (PRILOSEC) 20 MG capsule, Take 20 mg by mouth daily with breakfast. , Disp: , Rfl:  .  oxyCODONE (ROXICODONE) 15 MG immediate release tablet, Take 15 mg by mouth 3 (three) times daily as needed., Disp: , Rfl:  .  polyethylene glycol (MIRALAX / GLYCOLAX) packet, Take 17 g by mouth daily as needed. , Disp: , Rfl:  .  pravastatin (PRAVACHOL) 40 MG tablet, Take 20 mg by mouth at bedtime. , Disp: , Rfl:  .  prochlorperazine (COMPAZINE) 10 MG tablet, Take 1 tablet (10 mg total) by mouth every 6 (six) hours as needed for nausea., Disp: 60 tablet, Rfl: 1 .  QUEtiapine (SEROQUEL) 400 MG tablet, Take 400 mg by mouth at bedtime., Disp: , Rfl:  .  vitamin B-12 (CYANOCOBALAMIN) 1000 MCG tablet, Take 1,000 mcg by mouth daily., Disp: , Rfl:   Allergies  Allergen Reactions  . Bee Venom Anaphylaxis  . Ibuprofen Shortness Of Breath and Swelling     ROS  As noted in HPI.   Physical Exam  BP 118/83 (BP Location: Right Arm)   Pulse 88   Temp 98.6 F (37 C) (Oral)   Resp 18   SpO2 100%   Constitutional: Well developed, well nourished, no acute distress Eyes:  PERRLA no direct or consensual photophobia EOMI, conjunctiva normal bilaterally.  Funduscopic no hemorrhage. Optic disks appear sharp Visual fields intact to confrontation in all quadrants bilaterally Intraocular pressure left eye 15            Right eye 14  Visual acuity left eye 20/50, right eye 20/70.  HENT: Normocephalic, atraumatic,mucus membranes moist Respiratory: Normal inspiratory effort Cardiovascular: Normal rate GI: nondistended skin: No rash, skin intact Musculoskeletal: no deformities Neurologic: Alert & oriented x 3, cranial  nerves II through XII intact as tested, grip strength equal bilaterally. No pronator drift. No arm or leg weakness. No facial droop. Steady gait. Speech fluent. Psychiatric: Speech and behavior appropriate   ED Course   Medications - No data to display  Orders Placed This Encounter  Procedures  . Visual acuity screening    Standing Status:   Standing    Number of Occurrences:   1  . I-STAT, chem 8    Standing Status:   Standing    Number of Occurrences:   1    Results for orders placed or performed during the hospital encounter of 07/17/17 (from the past 24 hour(s))  I-STAT, chem 8     Status: Abnormal   Collection Time: 07/17/17 12:34 PM  Result Value Ref Range   Sodium 140 135 - 145 mmol/L   Potassium 4.5 3.5 - 5.1 mmol/L   Chloride 105 101 - 111 mmol/L   BUN 28 (H) 6 - 20 mg/dL   Creatinine, Ser 1.30 (H) 0.61 - 1.24 mg/dL   Glucose, Bld 163 (H) 65 - 99 mg/dL   Calcium, Ion 1.18 1.15 - 1.40 mmol/L   TCO2 24 0 - 100 mmol/L   Hemoglobin 14.6 13.0 - 17.0 g/dL   HCT 43.0 39.0 - 52.0 %   No results found.  ED Clinical Impression  Blurry vision, bilateral   ED Assessment/Plan  stroke and mass is in the differential, but feel that this is less likely because he is completely neurologically intact and has no obvious visual field deficits.  Glucose is not significantly elevated. BUN creatinine slightly elevated but there are no previous labs for comparison here or in care everywhere. Suspect that this is his baseline. Did discuss these labs with him though. Advised that he will need to have this rechecked by his primary care physician at some point.  1334 d.w Dr. Manuella Ghazi at [facility]. States that he will see him right now. We'll send patient immediately over to the clinic.  Discussed rationale for going to the ophthalmologist office immediately. Patient agrees with plan.   No orders of the defined types were placed in this encounter.   *This clinic note was  created using Dragon dictation software. Therefore, there may be occasional mistakes despite careful proofreading.  ?   Melynda Ripple, MD 07/17/17 [phone]

## 2017-07-17 NOTE — Discharge Instructions (Signed)
Go to [facility] at [employer]. Tell the front office that the doctor said that he will work you in. I spoke with Dr. Manuella Ghazi.

## 2017-07-17 NOTE — ED Notes (Signed)
20 70  Left   20 /  50  Right  Visual  acuity

## 2018-05-06 DIAGNOSIS — C44319 Basal cell carcinoma of skin of other parts of face: Secondary | ICD-10-CM | POA: Diagnosis not present

## 2018-05-24 DIAGNOSIS — C44319 Basal cell carcinoma of skin of other parts of face: Secondary | ICD-10-CM | POA: Diagnosis not present

## 2018-07-05 DIAGNOSIS — Z85828 Personal history of other malignant neoplasm of skin: Secondary | ICD-10-CM | POA: Diagnosis not present

## 2018-07-05 DIAGNOSIS — L905 Scar conditions and fibrosis of skin: Secondary | ICD-10-CM | POA: Diagnosis not present

## 2018-09-02 DIAGNOSIS — Z23 Encounter for immunization: Secondary | ICD-10-CM | POA: Diagnosis not present

## 2018-10-05 DIAGNOSIS — D225 Melanocytic nevi of trunk: Secondary | ICD-10-CM | POA: Diagnosis not present

## 2018-10-05 DIAGNOSIS — L814 Other melanin hyperpigmentation: Secondary | ICD-10-CM | POA: Diagnosis not present

## 2018-10-05 DIAGNOSIS — L821 Other seborrheic keratosis: Secondary | ICD-10-CM | POA: Diagnosis not present

## 2018-10-05 DIAGNOSIS — Z85828 Personal history of other malignant neoplasm of skin: Secondary | ICD-10-CM | POA: Diagnosis not present

## 2018-10-05 DIAGNOSIS — D1801 Hemangioma of skin and subcutaneous tissue: Secondary | ICD-10-CM | POA: Diagnosis not present

## 2018-10-08 DIAGNOSIS — R1310 Dysphagia, unspecified: Secondary | ICD-10-CM | POA: Diagnosis not present

## 2018-10-08 DIAGNOSIS — Z85038 Personal history of other malignant neoplasm of large intestine: Secondary | ICD-10-CM | POA: Diagnosis not present

## 2018-11-08 DIAGNOSIS — C4441 Basal cell carcinoma of skin of scalp and neck: Secondary | ICD-10-CM | POA: Diagnosis not present

## 2018-11-08 DIAGNOSIS — C44319 Basal cell carcinoma of skin of other parts of face: Secondary | ICD-10-CM | POA: Diagnosis not present

## 2018-11-19 DIAGNOSIS — Z85038 Personal history of other malignant neoplasm of large intestine: Secondary | ICD-10-CM | POA: Diagnosis not present

## 2018-11-19 DIAGNOSIS — Z980 Intestinal bypass and anastomosis status: Secondary | ICD-10-CM | POA: Diagnosis not present

## 2018-11-19 DIAGNOSIS — B9681 Helicobacter pylori [H. pylori] as the cause of diseases classified elsewhere: Secondary | ICD-10-CM | POA: Diagnosis not present

## 2018-11-19 DIAGNOSIS — D123 Benign neoplasm of transverse colon: Secondary | ICD-10-CM | POA: Diagnosis not present

## 2018-11-19 DIAGNOSIS — D124 Benign neoplasm of descending colon: Secondary | ICD-10-CM | POA: Diagnosis not present

## 2018-11-19 DIAGNOSIS — D125 Benign neoplasm of sigmoid colon: Secondary | ICD-10-CM | POA: Diagnosis not present

## 2018-11-19 DIAGNOSIS — K5730 Diverticulosis of large intestine without perforation or abscess without bleeding: Secondary | ICD-10-CM | POA: Diagnosis not present

## 2018-11-19 DIAGNOSIS — R1310 Dysphagia, unspecified: Secondary | ICD-10-CM | POA: Diagnosis not present

## 2018-11-19 DIAGNOSIS — K228 Other specified diseases of esophagus: Secondary | ICD-10-CM | POA: Diagnosis not present

## 2018-11-19 DIAGNOSIS — K2930 Chronic superficial gastritis without bleeding: Secondary | ICD-10-CM | POA: Diagnosis not present

## 2018-11-24 DIAGNOSIS — K228 Other specified diseases of esophagus: Secondary | ICD-10-CM | POA: Diagnosis not present

## 2018-11-24 DIAGNOSIS — D125 Benign neoplasm of sigmoid colon: Secondary | ICD-10-CM | POA: Diagnosis not present

## 2018-11-24 DIAGNOSIS — D124 Benign neoplasm of descending colon: Secondary | ICD-10-CM | POA: Diagnosis not present

## 2018-11-24 DIAGNOSIS — K2930 Chronic superficial gastritis without bleeding: Secondary | ICD-10-CM | POA: Diagnosis not present

## 2018-12-17 DIAGNOSIS — C4441 Basal cell carcinoma of skin of scalp and neck: Secondary | ICD-10-CM | POA: Diagnosis not present

## 2019-01-17 DIAGNOSIS — A048 Other specified bacterial intestinal infections: Secondary | ICD-10-CM | POA: Diagnosis not present

## 2019-01-20 ENCOUNTER — Ambulatory Visit (INDEPENDENT_AMBULATORY_CARE_PROVIDER_SITE_OTHER): Payer: Medicare Other | Admitting: Internal Medicine

## 2019-01-20 ENCOUNTER — Encounter: Payer: Self-pay | Admitting: Internal Medicine

## 2019-01-20 DIAGNOSIS — K219 Gastro-esophageal reflux disease without esophagitis: Secondary | ICD-10-CM | POA: Diagnosis not present

## 2019-01-20 DIAGNOSIS — F319 Bipolar disorder, unspecified: Secondary | ICD-10-CM | POA: Diagnosis not present

## 2019-01-20 DIAGNOSIS — N400 Enlarged prostate without lower urinary tract symptoms: Secondary | ICD-10-CM | POA: Diagnosis not present

## 2019-01-20 DIAGNOSIS — F411 Generalized anxiety disorder: Secondary | ICD-10-CM | POA: Diagnosis not present

## 2019-01-20 DIAGNOSIS — C189 Malignant neoplasm of colon, unspecified: Secondary | ICD-10-CM

## 2019-01-20 DIAGNOSIS — E785 Hyperlipidemia, unspecified: Secondary | ICD-10-CM | POA: Insufficient documentation

## 2019-01-20 DIAGNOSIS — Z8619 Personal history of other infectious and parasitic diseases: Secondary | ICD-10-CM | POA: Diagnosis not present

## 2019-01-20 DIAGNOSIS — E119 Type 2 diabetes mellitus without complications: Secondary | ICD-10-CM | POA: Diagnosis not present

## 2019-01-20 DIAGNOSIS — K279 Peptic ulcer, site unspecified, unspecified as acute or chronic, without hemorrhage or perforation: Secondary | ICD-10-CM | POA: Diagnosis not present

## 2019-01-20 DIAGNOSIS — M1990 Unspecified osteoarthritis, unspecified site: Secondary | ICD-10-CM | POA: Insufficient documentation

## 2019-01-20 DIAGNOSIS — E7800 Pure hypercholesterolemia, unspecified: Secondary | ICD-10-CM

## 2019-01-20 DIAGNOSIS — M159 Polyosteoarthritis, unspecified: Secondary | ICD-10-CM

## 2019-01-20 DIAGNOSIS — M150 Primary generalized (osteo)arthritis: Secondary | ICD-10-CM

## 2019-01-20 DIAGNOSIS — F4310 Post-traumatic stress disorder, unspecified: Secondary | ICD-10-CM | POA: Insufficient documentation

## 2019-01-20 DIAGNOSIS — J410 Simple chronic bronchitis: Secondary | ICD-10-CM | POA: Diagnosis not present

## 2019-01-20 DIAGNOSIS — J449 Chronic obstructive pulmonary disease, unspecified: Secondary | ICD-10-CM | POA: Insufficient documentation

## 2019-01-20 NOTE — Assessment & Plan Note (Signed)
Continue Pravastatin Encouraged him to consume a low saturated fat diet and try to increase aerobic exercise

## 2019-01-20 NOTE — Assessment & Plan Note (Signed)
Stable on Celexa and Seroquel He will continue to follow with psychiatry at the [facility]

## 2019-01-20 NOTE — Assessment & Plan Note (Signed)
Chronic He is taking Oxycodone, Flexeril and Gabapentin as prescribed by the [place]

## 2019-01-20 NOTE — Assessment & Plan Note (Signed)
Continue Acyclovir as needed

## 2019-01-20 NOTE — Assessment & Plan Note (Signed)
Stable on Celexa He will continue to follow with psychiatry at the [facility]

## 2019-01-20 NOTE — Assessment & Plan Note (Signed)
In remission He will continue to follow with oncology

## 2019-01-20 NOTE — Progress Notes (Signed)
HPI  Pt presents to the clinic today to establish care and for management of the conditions listed below. He is transferring care from his PCP in [place], [place].  Anxiety/Bipolar/ PTSD: He served in [place]. He is talking Citalopram and Seroquel as prescribed. He follows with psychiatry. He does not see a therapist.  Osteoarthritis: Mainly in her back. He has had multiple lower back surgeries, 1 neck surgery.  He takes Oxycodone 6 x day, Gabapentin and Flexeril as prescribed by the [place].  Hx of Colon Cancer: s/p right hemicolectomy and chemotherapy. He still sees oncology yearly.  COPD: He has a slight of cough but denies shortness of breath. He uses Albuterol as needed with good relief. He no longer smokes. He does not see pulmonology.  GERD with PUD: Triggered by cancer. He takes Omeprazole as prescribed. He has had an upper endoscopy 10/2018.  HLD: He denies myalgias on Pravastatin. He tries to consume a low fat diet.  DM 2: His last A1C was 7.4%. His sugars range 106- 245. He is taking Metformin and Glipizide as prescribed. He is on Lisinopril for renal protection. His last eye exam was 1 month ago. He checks his feet daily.  BPH: He denies urinary symptoms on Flomax.  Hx of Cold Sores: He takes Acyclovir as needed with good relief.  Flu: 11/2018 Tetanus: unsure Pneumovax: never Prevanr: 08/2018 Shingrix: 2019 PSA Screening: unsure Colon Screening: 10/2018 Vision Screening: annually Dentist: as needed, dentures  Past Medical History:  Diagnosis Date  . Anxiety   . Arthritis   . Bipolar 1 disorder ([place])   . Chronic back pain   . 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 Complication of anesthesia    "I GET REAL COLD AND CAN'T URINATE"---  urinary retention  . COPD with emphysema ([place])   . Cough   . Diverticulosis of colon   .  GERD (gastroesophageal reflux disease)   . History of adenomatous polyp of colon   . History of bleeding peptic ulcer    2000  . History of diverticulitis of colon   . History of panic attacks   . Hyperlipidemia   . Osteoporosis   . PTSD (post-traumatic stress disorder)    [place]  . Rectus diastasis   . Wears dentures    upper    Current Outpatient Medications  Medication Sig Dispense Refill  . albuterol (PROVENTIL HFA;VENTOLIN HFA) 108 (90 BASE) MCG/ACT inhaler Inhale into the lungs every 6 (six) hours as needed for wheezing or shortness of breath.    . citalopram (CELEXA) 40 MG tablet Take 40 mg by mouth at bedtime.    . cyclobenzaprine (FLEXERIL) 10 MG tablet Take 10 mg by mouth 3 (three) times daily.     Marland Kitchen DM-Phenylephrine-Acetaminophen 10-5-325 MG/15ML LIQD Take by mouth as needed.    . docusate sodium (COLACE) 100 MG capsule Take 200 mg by mouth at bedtime.    Marland Kitchen EPINEPHrine 0.3 mg/0.3 mL IJ SOAJ injection Inject 0.3 mg into the muscle daily as needed (allergic reaction).    Marland Kitchen ergocalciferol (VITAMIN D2) 50000 UNITS capsule Take 50,000 Units by mouth 2 (two) times a week.    Marland Kitchen FLUAD 0.5 ML SUSY PHARMACIST ADMINISTERED IMMUNIZATION ADMINISTERED AT TIME OF DISPENSING    . gabapentin (NEURONTIN) 300 MG capsule Take 300 mg by mouth 3 (three) times daily.    Marland Kitchen  glipiZIDE (GLUCOTROL) 10 MG tablet Take 1 tablet by mouth 2 (two) times daily.    Marland Kitchen lisinopril (PRINIVIL,ZESTRIL) 5 MG tablet     . metFORMIN (GLUCOPHAGE) 1000 MG tablet Take 1 tablet by mouth 2 (two) times daily.    . Multiple Vitamin (MULTIVITAMIN) tablet Take 1 tablet by mouth daily.    Marland Kitchen omeprazole (PRILOSEC) 20 MG capsule Take 20 mg by mouth daily with breakfast.     . oxyCODONE (ROXICODONE) 15 MG immediate release tablet Take 15 mg by mouth 3 (three) times daily as needed.    . polyethylene glycol (MIRALAX / GLYCOLAX) packet Take 17 g by mouth daily as needed.     . pravastatin (PRAVACHOL) 40 MG tablet Take 20 mg by  mouth at bedtime.     . prochlorperazine (COMPAZINE) 10 MG tablet Take 1 tablet (10 mg total) by mouth every 6 (six) hours as needed for nausea. 60 tablet 1  . QUEtiapine (SEROQUEL) 400 MG tablet Take 400 mg by mouth at bedtime.    . vitamin B-12 (CYANOCOBALAMIN) 1000 MCG tablet Take 1,000 mcg by mouth daily.     No current facility-administered medications for this visit.     Allergies  Allergen Reactions  . Bee Venom Anaphylaxis  . Ibuprofen Shortness Of Breath and Swelling    Family History  Problem Relation Age of Onset  . Heart disease Mother     Social History   Socioeconomic History  . Marital status: Married    Spouse name: Caren Griffins  . Number of children: 2  . Years of education: Not on file  . Highest education level: Not on file  Occupational History  . Occupation: retired    Comment: trucking  Social Needs  . Financial resource strain: Not on file  . Food insecurity:    Worry: Not on file    Inability: Not on file  . Transportation needs:    Medical: Not on file    Non-medical: Not on file  Tobacco Use  . Smoking status: Former Smoker    Packs/day: 1.00    Years: 30.00    Pack years: 30.00    Types: Cigarettes    Last attempt to quit: 06/23/2004    Years since quitting: 14.5  . Smokeless tobacco: Never Used  Substance and Sexual Activity  . Alcohol use: No  . Drug use: No  . Sexual activity: Not on file  Lifestyle  . Physical activity:    Days per week: Not on file    Minutes per session: Not on file  . Stress: Not on file  Relationships  . Social connections:    Talks on phone: Not on file    Gets together: Not on file    Attends religious service: Not on file    Active member of club or organization: Not on file    Attends meetings of clubs or organizations: Not on file    Relationship status: Not on file  . Intimate partner violence:    Fear of current or ex partner: Not on file    Emotionally abused: Not on file    Physically abused: Not  on file    Forced sexual activity: Not on file  Other Topics Concern  . Not on file  Social History Narrative   Married to wife, Caren Griffins for 45+ years   #2 grown children: Otila Kluver and Antony Haste   #3 grandchildren- youngest 20 years   Retired trucking-local     ROS:  Constitutional: Denies  fever, malaise, fatigue, headache or abrupt weight changes.  HEENT: Denies eye pain, eye redness, ear pain, ringing in the ears, wax buildup, runny nose, nasal congestion, bloody nose, or sore throat. Respiratory: Pt reports cough. Denies difficulty breathing, shortness of breath or sputum production.   Cardiovascular: Denies chest pain, chest tightness, palpitations or swelling in the hands or feet.  Gastrointestinal: Pt reports loose stools. Denies abdominal pain, bloating, constipation, diarrhea or blood in the stool.  GU: Denies frequency, urgency, pain with urination, blood in urine, odor or discharge. Musculoskeletal: Pt reports chronic joint pain. Denies decrease in range of motion, difficulty with gait, muscle pain or joint pain and swelling.  Skin: Denies redness, rashes, lesions or ulcercations.  Neurological: Denies dizziness, difficulty with memory, difficulty with speech or problems with balance and coordination.  Psych: Pt has a history of anxiety and depression. Denies SI/HI.  No other specific complaints in a complete review of systems (except as listed in HPI above).  PE:  BP 124/72   Pulse (!) 108   Temp 97.8 F (36.6 C) (Oral)   Wt 194 lb (88 kg)   SpO2 96%   BMI 27.84 kg/m   Wt Readings from Last 3 Encounters:  10/11/15 205 lb (93 kg)  07/12/15 201 lb 4.8 oz (91.3 kg)  01/10/15 183 lb 6.4 oz (83.2 kg)    General: Appears his stated age, well developed, well nourished in NAD. Neck: Neck supple, trachea midline. No masses, lumps or thyromegaly present.  Cardiovascular: Normal rate and rhythm. S1,S2 noted.  No murmur, rubs or gallops noted. No JVD or BLE edema.   Pulmonary/Chest: Normal effort and positive vesicular breath sounds. No respiratory distress. No wheezes, rales or ronchi noted.  Abdomen: Soft and nontender. Normal bowel sounds. Musculoskeletal: Gait slow but steady without device. Neurological: Alert and oriented.  Psychiatric: Mood and affect normal. Behavior is normal. Judgment and thought content normal.    BMET    Component Value Date/Time   NA 140 07/17/2017 1234   NA 138 07/12/2015 1005   K 4.5 07/17/2017 1234   K 4.6 07/12/2015 1005   CL 105 07/17/2017 1234   CO2 24 07/12/2015 1005   GLUCOSE 163 (H) 07/17/2017 1234   GLUCOSE 124 07/12/2015 1005   BUN 28 (H) 07/17/2017 1234   BUN 19.3 07/12/2015 1005   CREATININE 1.30 (H) 07/17/2017 1234   CREATININE 1.1 07/12/2015 1005   CALCIUM 9.2 07/12/2015 1005   GFRNONAA >90 07/03/2014 0505   GFRAA >90 07/03/2014 0505    Lipid Panel  No results found for: CHOL, TRIG, HDL, CHOLHDL, VLDL, LDLCALC  CBC    Component Value Date/Time   WBC 5.3 07/12/2015 1006   WBC 10.5 07/03/2014 0505   RBC 3.89 (L) 07/12/2015 1006   RBC 3.04 (L) 07/03/2014 0505   HGB 14.6 07/17/2017 1234   HGB 13.4 07/12/2015 1006   HCT 43.0 07/17/2017 1234   HCT 38.7 07/12/2015 1006   PLT 160 07/12/2015 1006   MCV 99.4 (H) 07/12/2015 1006   MCH 34.4 (H) 07/12/2015 1006   MCH 32.2 07/03/2014 0505   MCHC 34.6 07/12/2015 1006   MCHC 34.5 07/03/2014 0505   RDW 13.5 07/12/2015 1006   LYMPHSABS 1.6 07/12/2015 1006   MONOABS 0.6 07/12/2015 1006   EOSABS 0.2 07/12/2015 1006   BASOSABS 0.0 07/12/2015 1006    Hgb A1C Lab Results  Component Value Date   HGBA1C 6.0 (H) 06/29/2014     Assessment and Plan:

## 2019-01-20 NOTE — Assessment & Plan Note (Signed)
Managed by the [facility] Continue to monitor sugars Continue Metformin and Glipizide On lisinopril for renal protection Encouraged him to consume a low carb diet and exercise for weight loss [facility] Encouraged yearly eye exams

## 2019-01-20 NOTE — Assessment & Plan Note (Signed)
Continue oOeprazole as prescribed We not indicated given history of PUD

## 2019-01-20 NOTE — Assessment & Plan Note (Signed)
Continue Omeprazole as prescribed We not indicated

## 2019-01-20 NOTE — Assessment & Plan Note (Signed)
Continue Flomax as prescribed We will monitor

## 2019-01-20 NOTE — Patient Instructions (Signed)
Carbohydrate Counting for Diabetes Mellitus, Adult  Carbohydrate counting is a method of keeping track of how many carbohydrates you eat. Eating carbohydrates naturally increases the amount of sugar (glucose) in the blood. Counting how many carbohydrates you eat helps keep your blood glucose within normal limits, which helps you manage your diabetes (diabetes mellitus). It is important to know how many carbohydrates you can safely have in each meal. This is different for every person. A diet and nutrition specialist (registered dietitian) can help you make a meal plan and calculate how many carbohydrates you should have at each meal and snack. Carbohydrates are found in the following foods:  Grains, such as breads and cereals.  Dried beans and soy products.  Starchy vegetables, such as potatoes, peas, and corn.  Fruit and fruit juices.  Milk and yogurt.  Sweets and snack foods, such as cake, cookies, candy, chips, and soft drinks. How do I count carbohydrates? There are two ways to count carbohydrates in food. You can use either of the methods or a combination of both. Reading "Nutrition Facts" on packaged food The "Nutrition Facts" list is included on the labels of almost all packaged foods and beverages in the [place] It includes:  The serving size.  Information about nutrients in each serving, including the grams (g) of carbohydrate per serving. To use the "Nutrition Facts":  Decide how many servings you will have.  Multiply the number of servings by the number of carbohydrates per serving.  The resulting number is the total amount of carbohydrates that you will be having. Learning standard serving sizes of other foods When you eat carbohydrate foods that are not packaged or do not include "Nutrition Facts" on the label, you need to measure the servings in order to count the amount of carbohydrates:  Measure the foods that you will eat with a food scale or measuring cup, if needed.   Decide how many standard-size servings you will eat.  Multiply the number of servings by 15. Most carbohydrate-rich foods have about 15 g of carbohydrates per serving. ? For example, if you eat 8 oz (170 g) of strawberries, you will have eaten 2 servings and 30 g of carbohydrates (2 servings x 15 g = 30 g).  For foods that have more than one food mixed, such as soups and casseroles, you must count the carbohydrates in each food that is included. The following list contains standard serving sizes of common carbohydrate-rich foods. Each of these servings has about 15 g of carbohydrates:   hamburger bun or  English muffin.   oz (15 mL) syrup.   oz (14 g) jelly.  1 slice of bread.  1 six-inch tortilla.  3 oz (85 g) cooked rice or pasta.  4 oz (113 g) cooked dried beans.  4 oz (113 g) starchy vegetable, such as peas, corn, or potatoes.  4 oz (113 g) hot cereal.  4 oz (113 g) mashed potatoes or  of a large baked potato.  4 oz (113 g) canned or frozen fruit.  4 oz (120 mL) fruit juice.  4-6 crackers.  6 chicken nuggets.  6 oz (170 g) unsweetened dry cereal.  6 oz (170 g) plain fat-free yogurt or yogurt sweetened with artificial sweeteners.  8 oz (240 mL) milk.  8 oz (170 g) fresh fruit or one small piece of fruit.  24 oz (680 g) popped popcorn. Example of carbohydrate counting Sample meal  3 oz (85 g) chicken breast.  6 oz (170 g)   brown rice.  4 oz (113 g) corn.  8 oz (240 mL) milk.  8 oz (170 g) strawberries with sugar-free whipped topping. Carbohydrate calculation 1. Identify the foods that contain carbohydrates: ? Rice. ? Corn. ? Milk. ? Strawberries. 2. Calculate how many servings you have of each food: ? 2 servings rice. ? 1 serving corn. ? 1 serving milk. ? 1 serving strawberries. 3. Multiply each number of servings by 15 g: ? 2 servings rice x 15 g = 30 g. ? 1 serving corn x 15 g = 15 g. ? 1 serving milk x 15 g = 15 g. ? 1 serving  strawberries x 15 g = 15 g. 4. Add together all of the amounts to find the total grams of carbohydrates eaten: ? 30 g + 15 g + 15 g + 15 g = 75 g of carbohydrates total. Summary  Carbohydrate counting is a method of keeping track of how many carbohydrates you eat.  Eating carbohydrates naturally increases the amount of sugar (glucose) in the blood.  Counting how many carbohydrates you eat helps keep your blood glucose within normal limits, which helps you manage your diabetes.  A diet and nutrition specialist (registered dietitian) can help you make a meal plan and calculate how many carbohydrates you should have at each meal and snack. This information is not intended to replace advice given to you by your health care provider. Make sure you discuss any questions you have with your health care provider. Document Released: 12/01/2005 Document Revised: 06/10/2017 Document Reviewed: 05/14/2016 Elsevier Interactive Patient Education  2019 Elsevier Inc.  

## 2019-01-20 NOTE — Assessment & Plan Note (Signed)
Continue Albuterol as needed ?We will monitor ?

## 2019-01-20 NOTE — Assessment & Plan Note (Signed)
Stable on Celexa and Seroquel He will continue to follow with psychiatry at the [facility]

## 2019-03-04 DIAGNOSIS — L821 Other seborrheic keratosis: Secondary | ICD-10-CM | POA: Diagnosis not present

## 2019-03-04 DIAGNOSIS — L814 Other melanin hyperpigmentation: Secondary | ICD-10-CM | POA: Diagnosis not present

## 2019-03-04 DIAGNOSIS — D229 Melanocytic nevi, unspecified: Secondary | ICD-10-CM | POA: Diagnosis not present

## 2019-03-04 DIAGNOSIS — Z85828 Personal history of other malignant neoplasm of skin: Secondary | ICD-10-CM | POA: Diagnosis not present

## 2019-07-13 ENCOUNTER — Other Ambulatory Visit: Payer: Self-pay

## 2019-07-27 ENCOUNTER — Encounter: Payer: Self-pay | Admitting: Family Medicine

## 2019-07-27 ENCOUNTER — Ambulatory Visit (INDEPENDENT_AMBULATORY_CARE_PROVIDER_SITE_OTHER): Payer: Medicare Other | Admitting: Family Medicine

## 2019-07-27 VITALS — Wt 192.7 lb

## 2019-07-27 DIAGNOSIS — K529 Noninfective gastroenteritis and colitis, unspecified: Secondary | ICD-10-CM

## 2019-07-27 MED ORDER — ONDANSETRON 8 MG PO TBDP
8.0000 mg | ORAL_TABLET | Freq: Three times a day (TID) | ORAL | 0 refills | Status: DC | PRN
Start: 2019-07-27 — End: 2023-01-19

## 2019-07-27 NOTE — Progress Notes (Signed)
Virtual Visit via Video Note  I connected with Matthew [name] on 07/27/19 at  9:12 AM EDT by a video enabled telemedicine application and verified that I am speaking with the correct person using two identifiers.  Location: Patient: In his home, wife present for the call.  Provider: [place]   I discussed the limitations of evaluation and management by telemedicine and the availability of in person appointments. The patient expressed understanding and agreed to proceed.  History of Present Illness: Chief Complaint  Patient presents with  . Nausea    Nausea, vomiting, diarrhea, cough, fever, headache and dizziness. Fever 98-99. Pt states that he has not been able to keep any food down since Monday. Pt not sure if exposed to anyone with Covid.    This is a 73 yo male who presents today for virtual visit for the above symptoms.  He ate at a restaurant Monday night and later that night began having diarrhea and vomiting.  Diarrhea and vomiting persisted through yesterday.  He has not vomited in greater than 24 hours.  He has had continued diarrhea with 3 bowel movements so far today.  He has been taking Imodium right ear which has helped slow down his bowel movements.  He has not had any blood in his bowel movements.  He is sore in his rib cage from vomiting.  Otherwise, he is not experiencing abdominal pain.  He is urinating in small quantities.  His blood sugar yesterday was 211.  He had some dizziness but this is improved today.  He has been able to keep down some [facility] today.  He has had an occasional dry cough and some runny nose.  No shortness of breath or wheeze.  He has had no known sick contacts.  His wife has not been ill.   Past Medical History:  Diagnosis Date  . Anxiety   . Arthritis   . Bipolar 1 disorder ([place])   . Chronic back pain   . Colon cancer [facility]) oncologist-  dr Benay Spice   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--  s/p  right  hemicolectomy 06-30-2014  and chemotherapy complete 01-10-2015  . Complication of anesthesia    "I GET REAL COLD AND CAN'T URINATE"---  urinary retention  . COPD with emphysema ([place])   . Cough   . Diverticulosis of colon   . GERD (gastroesophageal reflux disease)   . History of adenomatous polyp of colon   . History of bleeding peptic ulcer    2000  . History of diverticulitis of colon   . History of panic attacks   . Hyperlipidemia   . Osteoporosis   . PTSD (post-traumatic stress disorder)    [place]  . Rectus diastasis   . Wears dentures    upper   Past Surgical History:  Procedure Laterality Date  . ANTERIOR CERVICAL DECOMP/DISCECTOMY FUSION  04-18-2003   C5 -- 6  . APPENDECTOMY  yrs ago  . BACK SURGERY  [phone]   lumbar lam and fusions  . CIRCUMCISION  03-05-2005  . COLONOSCOPY  [id number]  . LAPAROSCOPIC PARTIAL COLECTOMY N/A 06/30/2014   Procedure: LAPAROSCOPIC right COLECTOMY;  Surgeon: [name], MD;  Location: [facility];  Service: General;  Laterality: N/A;  . PORT-A-CATH REMOVAL Left 10/11/2015   Procedure: REMOVAL PORT-A-CATH;  Surgeon: [name], MD;  Location: [facility];  Service: General;  Laterality: Left;  . PORTACATH PLACEMENT Left 07/31/2014   Procedure: INSERTION PORT-A-CATH;  Surgeon:  Stark Klein, MD;  Location: [place];  Service: General;  Laterality: Left;  . UPPER GI ENDOSCOPY  2015   Family History  Problem Relation Age of Onset  . Heart disease Mother    Social History   Tobacco Use  . Smoking status: Former Smoker    Packs/day: 1.00    Years: 30.00    Pack years: 30.00    Types: Cigarettes    Quit date: 06/23/2004    Years since quitting: 15.1  . Smokeless tobacco: Never Used  Substance Use Topics  . Alcohol use: No  . Drug use: No      Observations/Objective: The patient is alert and answers questions appropriately.  He looks mildly ill.  He was ambulating around his home without difficulty.   He is normally conversive without shortness of breath, audible wheeze or witnessed cough.  His mood and affect are appropriate. Wt 192 lb 11.2 oz (87.4 kg)   BMI 27.65 kg/m  Wt Readings from Last 3 Encounters:  07/27/19 192 lb 11.2 oz (87.4 kg)  01/20/19 194 lb (88 kg)  10/11/15 205 lb (93 kg)    Assessment and Plan: 1. Gastroenteritis -He has had some improvement today, discussed sending in medication for him for nausea and he agreed. -Reviewed brat diet with him and encouraged him to introduce solids gradually. -Discussed importance of oral rehydration and encouraged him to drink 1 to 2 ounces every 15 minutes until he is making more urine and it is light in color. -Strict RTC/ER/UC precautions given. - ondansetron (ZOFRAN-ODT) 8 MG disintegrating tablet; Take 1 tablet (8 mg total) by mouth every 8 (eight) hours as needed for nausea.  Dispense: 12 tablet; Refill: 0 -Needs follow-up with PCP, encouraged him to schedule follow-up appointment  Matthew Reamer, FNP-BC  [facility]  07/27/2019 10:34 AM   Follow Up Instructions:    I discussed the assessment and treatment plan with the patient. The patient was provided an opportunity to ask questions and all were answered. The patient agreed with the plan and demonstrated an understanding of the instructions.   The patient was advised to call back or seek an in-person evaluation if the symptoms worsen or if the condition fails to improve as anticipated.    Elby Beck, FNP

## 2019-10-11 DIAGNOSIS — L814 Other melanin hyperpigmentation: Secondary | ICD-10-CM | POA: Diagnosis not present

## 2019-10-11 DIAGNOSIS — L821 Other seborrheic keratosis: Secondary | ICD-10-CM | POA: Diagnosis not present

## 2019-10-11 DIAGNOSIS — D1801 Hemangioma of skin and subcutaneous tissue: Secondary | ICD-10-CM | POA: Diagnosis not present

## 2019-10-11 DIAGNOSIS — L400 Psoriasis vulgaris: Secondary | ICD-10-CM | POA: Diagnosis not present

## 2019-10-11 DIAGNOSIS — D225 Melanocytic nevi of trunk: Secondary | ICD-10-CM | POA: Diagnosis not present

## 2019-10-11 DIAGNOSIS — Z85828 Personal history of other malignant neoplasm of skin: Secondary | ICD-10-CM | POA: Diagnosis not present

## 2019-11-08 ENCOUNTER — Encounter: Payer: Self-pay | Admitting: Internal Medicine

## 2019-11-08 ENCOUNTER — Ambulatory Visit (INDEPENDENT_AMBULATORY_CARE_PROVIDER_SITE_OTHER): Payer: Medicare Other | Admitting: Internal Medicine

## 2019-11-08 ENCOUNTER — Other Ambulatory Visit: Payer: Self-pay

## 2019-11-08 VITALS — BP 126/80 | HR 96 | Temp 97.7°F | Ht 68.5 in | Wt 195.0 lb

## 2019-11-08 DIAGNOSIS — E7800 Pure hypercholesterolemia, unspecified: Secondary | ICD-10-CM | POA: Diagnosis not present

## 2019-11-08 DIAGNOSIS — G620 Drug-induced polyneuropathy: Secondary | ICD-10-CM | POA: Insufficient documentation

## 2019-11-08 DIAGNOSIS — K219 Gastro-esophageal reflux disease without esophagitis: Secondary | ICD-10-CM

## 2019-11-08 DIAGNOSIS — Z8619 Personal history of other infectious and parasitic diseases: Secondary | ICD-10-CM | POA: Diagnosis not present

## 2019-11-08 DIAGNOSIS — Z0000 Encounter for general adult medical examination without abnormal findings: Secondary | ICD-10-CM | POA: Diagnosis not present

## 2019-11-08 DIAGNOSIS — Z23 Encounter for immunization: Secondary | ICD-10-CM | POA: Diagnosis not present

## 2019-11-08 DIAGNOSIS — E119 Type 2 diabetes mellitus without complications: Secondary | ICD-10-CM

## 2019-11-08 DIAGNOSIS — F4310 Post-traumatic stress disorder, unspecified: Secondary | ICD-10-CM

## 2019-11-08 DIAGNOSIS — M8949 Other hypertrophic osteoarthropathy, multiple sites: Secondary | ICD-10-CM

## 2019-11-08 DIAGNOSIS — F411 Generalized anxiety disorder: Secondary | ICD-10-CM

## 2019-11-08 DIAGNOSIS — J410 Simple chronic bronchitis: Secondary | ICD-10-CM

## 2019-11-08 DIAGNOSIS — F319 Bipolar disorder, unspecified: Secondary | ICD-10-CM

## 2019-11-08 DIAGNOSIS — N400 Enlarged prostate without lower urinary tract symptoms: Secondary | ICD-10-CM

## 2019-11-08 DIAGNOSIS — C189 Malignant neoplasm of colon, unspecified: Secondary | ICD-10-CM

## 2019-11-08 DIAGNOSIS — Z125 Encounter for screening for malignant neoplasm of prostate: Secondary | ICD-10-CM | POA: Diagnosis not present

## 2019-11-08 DIAGNOSIS — K279 Peptic ulcer, site unspecified, unspecified as acute or chronic, without hemorrhage or perforation: Secondary | ICD-10-CM

## 2019-11-08 DIAGNOSIS — M159 Polyosteoarthritis, unspecified: Secondary | ICD-10-CM

## 2019-11-08 NOTE — Patient Instructions (Signed)
Health Maintenance After Age 71 After age 71, you are at a higher risk for certain long-term diseases and infections as well as injuries from falls. Falls are a major cause of broken bones and head injuries in people who are older than age 71. Getting regular preventive care can help to keep you healthy and well. Preventive care includes getting regular testing and making lifestyle changes as recommended by your health care provider. Talk with your health care provider about:  Which screenings and tests you should have. A screening is a test that checks for a disease when you have no symptoms.  A diet and exercise plan that is right for you. What should I know about screenings and tests to prevent falls? Screening and testing are the best ways to find a health problem early. Early diagnosis and treatment give you the best chance of managing medical conditions that are common after age 71. Certain conditions and lifestyle choices may make you more likely to have a fall. Your health care provider may recommend:  Regular vision checks. Poor vision and conditions such as cataracts can make you more likely to have a fall. If you wear glasses, make sure to get your prescription updated if your vision changes.  Medicine review. Work with your health care provider to regularly review all of the medicines you are taking, including over-the-counter medicines. Ask your health care provider about any side effects that may make you more likely to have a fall. Tell your health care provider if any medicines that you take make you feel dizzy or sleepy.  Osteoporosis screening. Osteoporosis is a condition that causes the bones to get weaker. This can make the bones weak and cause them to break more easily.  Blood pressure screening. Blood pressure changes and medicines to control blood pressure can make you feel dizzy.  Strength and balance checks. Your health care provider may recommend certain tests to check your  strength and balance while standing, walking, or changing positions.  Foot health exam. Foot pain and numbness, as well as not wearing proper footwear, can make you more likely to have a fall.  Depression screening. You may be more likely to have a fall if you have a fear of falling, feel emotionally low, or feel unable to do activities that you used to do.  Alcohol use screening. Using too much alcohol can affect your balance and may make you more likely to have a fall. What actions can I take to lower my risk of falls? General instructions  Talk with your health care provider about your risks for falling. Tell your health care provider if: ? You fall. Be sure to tell your health care provider about all falls, even ones that seem minor. ? You feel dizzy, sleepy, or off-balance.  Take over-the-counter and prescription medicines only as told by your health care provider. These include any supplements.  Eat a healthy diet and maintain a healthy weight. A healthy diet includes low-fat dairy products, low-fat (lean) meats, and fiber from whole grains, beans, and lots of fruits and vegetables. Home safety  Remove any tripping hazards, such as rugs, cords, and clutter.  Install safety equipment such as grab bars in bathrooms and safety rails on stairs.  Keep rooms and walkways well-lit. Activity   Follow a regular exercise program to stay fit. This will help you maintain your balance. Ask your health care provider what types of exercise are appropriate for you.  If you need a cane or   walker, use it as recommended by your health care provider.  Wear supportive shoes that have nonskid soles. Lifestyle  Do not drink alcohol if your health care provider tells you not to drink.  If you drink alcohol, limit how much you have: ? 0-1 drink a day for women. ? 0-2 drinks a day for men.  Be aware of how much alcohol is in your drink. In the [place] one drink equals one typical bottle of beer (12  oz), one-half glass of wine (5 oz), or one shot of hard liquor (1 oz).  Do not use any products that contain nicotine or tobacco, such as cigarettes and e-cigarettes. If you need help quitting, ask your health care provider. Summary  Having a healthy lifestyle and getting preventive care can help to protect your health and wellness after age 71.  Screening and testing are the best way to find a health problem early and help you avoid having a fall. Early diagnosis and treatment give you the best chance for managing medical conditions that are more common for people who are older than age 71.  Falls are a major cause of broken bones and head injuries in people who are older than age 71. Take precautions to prevent a fall at home.  Work with your health care provider to learn what changes you can make to improve your health and wellness and to prevent falls. This information is not intended to replace advice given to you by your health care provider. Make sure you discuss any questions you have with your health care provider. Document Released: 10/14/2017 Document Revised: 03/24/2019 Document Reviewed: 10/14/2017 Elsevier Patient Education  2020 Elsevier Inc.  

## 2019-11-08 NOTE — Assessment & Plan Note (Addendum)
Will monitor

## 2019-11-08 NOTE — Assessment & Plan Note (Addendum)
Continue with Omeprazole as prescribed CBC and CMET today Will monitor

## 2019-11-08 NOTE — Assessment & Plan Note (Signed)
Denies any new issues or concerns.  Last visit with psychiatry was 2 months ago Controlled on Citalopram and Seroquel.  Continue follow up with psychiatry

## 2019-11-08 NOTE — Assessment & Plan Note (Addendum)
Controlled on Citalopram and Seroquel.  Continue follow up with psychiatry CMET today

## 2019-11-08 NOTE — Assessment & Plan Note (Addendum)
Continue with Omeprazole as prescribed Will monitor

## 2019-11-08 NOTE — Assessment & Plan Note (Addendum)
Continue to take Acyclovir as needed Will monitor

## 2019-11-08 NOTE — Progress Notes (Signed)
HPI:  Pt presents to the clinic today for his subsequent annual Medicare Wellness Exam. He is also due for follow up of chronic conditions.  Anxiety/Bipolar/PTSD: He served in [place]. Controlled on Citalopram and Seroquel. He follows with psychiatry. He does not see a therapist. He denies SI/HI.  Osteoarthritis: Mainly in his lower back and shoulders. He has had multiple lower back surgeries and 1 neck surgery. He takes Oxycodone 6 x a day, Gabapentin and Flexeril as prescribed by the [place].   Hx of Colon Cancer: s/p right hemicolectomy and chemotherapy. Initially followed with oncology yearly but has not gone to see them lately.   COPD: He reports slight cough at times but denies shortness of breath. He uses Albuterol as needed with good relief. He no longer smokes. He does not see pulmonology.   GERD with PUD: Triggered by colon cancer. He takes Omeprazole as prescribed. He has had an upper GI on 10/2018.   HLD: No recent HDL on file.  He deneies myalgias on Prevastatin. He tries to consume a low fat diet.   DM2: His last A1C was 6.0%, 06/2014. His sugars range 140-200. He is taking Metformin and Glipizide as prescribed. He is on Lisinopril for renal protection. His last eye exam was a year ago. He checks his feet daily.   BPH: He denies urinary symptoms on Flomax. He is not following with urology.  Hx of Cold Sores: He takes Acyclovir as needed with good relief.   Past Medical History:  Diagnosis Date  . Anxiety   . Arthritis   . Bipolar 1 disorder ([place])   . Chronic back pain   . Colon cancer [facility]) oncologist-  dr Benay Spice   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--  s/p  right hemicolectomy 06-30-2014  and chemotherapy complete 01-10-2015  . Complication of anesthesia    "I GET REAL COLD AND CAN'T URINATE"---  urinary retention  . COPD with emphysema ([place])   . Cough   . Diverticulosis of colon   . GERD (gastroesophageal reflux disease)   . History of  adenomatous polyp of colon   . History of bleeding peptic ulcer    2000  . History of diverticulitis of colon   . History of panic attacks   . Hyperlipidemia   . Osteoporosis   . PTSD (post-traumatic stress disorder)    [place]  . Rectus diastasis   . Wears dentures    upper    Current Outpatient Medications  Medication Sig Dispense Refill  . acyclovir (ZOVIRAX) 200 MG capsule Take 200 mg by mouth 3 times/day as needed-between meals & bedtime.    Marland Kitchen albuterol (PROVENTIL HFA;VENTOLIN HFA) 108 (90 BASE) MCG/ACT inhaler Inhale into the lungs every 6 (six) hours as needed for wheezing or shortness of breath.    . citalopram (CELEXA) 40 MG tablet Take 40 mg by mouth at bedtime.    . cyclobenzaprine (FLEXERIL) 10 MG tablet Take 10 mg by mouth 3 (three) times daily.     Marland Kitchen DM-Phenylephrine-Acetaminophen 10-5-325 MG/15ML LIQD Take by mouth as needed.    . docusate sodium (COLACE) 100 MG capsule Take 200 mg by mouth at bedtime.    Marland Kitchen EPINEPHrine 0.3 mg/0.3 mL IJ SOAJ injection Inject 0.3 mg into the muscle daily as needed (allergic reaction).    Marland Kitchen ergocalciferol (VITAMIN D2) 50000 UNITS capsule Take 50,000 Units by mouth 2 (two) times a week.    Marland Kitchen FLUAD 0.5 ML SUSY PHARMACIST ADMINISTERED IMMUNIZATION ADMINISTERED AT TIME  OF DISPENSING    . gabapentin (NEURONTIN) 300 MG capsule Take 300 mg by mouth 3 (three) times daily.    Marland Kitchen glipiZIDE (GLUCOTROL) 10 MG tablet Take 1 tablet by mouth 2 (two) times daily.    Marland Kitchen lisinopril (PRINIVIL,ZESTRIL) 5 MG tablet     . metFORMIN (GLUCOPHAGE) 1000 MG tablet Take 1 tablet by mouth 2 (two) times daily.    . Multiple Vitamin (MULTIVITAMIN) tablet Take 1 tablet by mouth daily.    . Omega-3 Fatty Acids (FISH OIL) 1000 MG CAPS Take by mouth.    Marland Kitchen omeprazole (PRILOSEC) 20 MG capsule Take 20 mg by mouth daily with breakfast.     . ondansetron (ZOFRAN-ODT) 8 MG disintegrating tablet Take 1 tablet (8 mg total) by mouth every 8 (eight) hours as needed for nausea. 12  tablet 0  . oxyCODONE (ROXICODONE) 15 MG immediate release tablet Take 15 mg by mouth 3 (three) times daily as needed.    . polyethylene glycol (MIRALAX / GLYCOLAX) packet Take 17 g by mouth daily as needed.     . pravastatin (PRAVACHOL) 40 MG tablet Take 20 mg by mouth at bedtime.     Marland Kitchen QUEtiapine (SEROQUEL) 400 MG tablet Take 400 mg by mouth at bedtime.    . tamsulosin (FLOMAX) 0.4 MG CAPS capsule Take 0.4 mg by mouth 2 (two) times daily.    . vitamin B-12 (CYANOCOBALAMIN) 1000 MCG tablet Take 1,000 mcg by mouth daily.     No current facility-administered medications for this visit.     Allergies  Allergen Reactions  . Bee Venom Anaphylaxis  . Ibuprofen Shortness Of Breath and Swelling    Family History  Problem Relation Age of Onset  . Heart disease Mother     Social History   Socioeconomic History  . Marital status: Married    Spouse name: Caren Griffins  . Number of children: 2  . Years of education: Not on file  . Highest education level: Not on file  Occupational History  . Occupation: retired    Comment: trucking  Social Needs  . Financial resource strain: Not on file  . Food insecurity    Worry: Not on file    Inability: Not on file  . Transportation needs    Medical: Not on file    Non-medical: Not on file  Tobacco Use  . Smoking status: Former Smoker    Packs/day: 1.00    Years: 30.00    Pack years: 30.00    Types: Cigarettes    Quit date: 06/23/2004    Years since quitting: 15.3  . Smokeless tobacco: Never Used  Substance and Sexual Activity  . Alcohol use: No  . Drug use: No  . Sexual activity: Not on file  Lifestyle  . Physical activity    Days per week: Not on file    Minutes per session: Not on file  . Stress: Not on file  Relationships  . Social Herbalist on phone: Not on file    Gets together: Not on file    Attends religious service: Not on file    Active member of club or organization: Not on file    Attends meetings of clubs or  organizations: Not on file    Relationship status: Not on file  . Intimate partner violence    Fear of current or ex partner: Not on file    Emotionally abused: Not on file    Physically abused: Not on file  Forced sexual activity: Not on file  Other Topics Concern  . Not on file  Social History Narrative   Married to wife, Caren Griffins for 45+ years   #2 grown children: Otila Kluver and Antony Haste   #3 grandchildren- youngest 12 years   Retired trucking-local     Hospitiliaztions: None  Health Maintenance:    Flu: 10/2019  Tetanus: unsure  Pneumovax: 10/2019  Prevnar: 08/2018  Shingrix: 2020, 2019  PSA: unsure  Colon Screening: 10/2018  Eye Doctor: annually  Dental Exam: as needed   Providers:   PCP:  Webb Silversmith, NP  Dermatologist: Mali Rund   Gastroenterologist: Ronnette Juniper ([place])     I have personally reviewed and have noted:  1. The patient's medical and social history 2. Their use of alcohol, tobacco or illicit drugs 3. Their current medications and supplements 4. The patient's functional ability including ADL's, fall risks, home safety risks and hearing or visual impairment. 5. Diet and physical activities 6. Evidence for depression or mood disorder  Subjective:   Review of Systems:   Constitutional: Denies fever, malaise, fatigue, headache or abrupt weight changes.  HEENT: Denies eye pain, eye redness, ear pain, ringing in the ears, wax buildup, runny nose, nasal congestion, bloody nose, or sore throat. Respiratory: Pt reports cough. Denies difficulty breathing, shortness of breath, or sputum production.   Cardiovascular: Denies chest pain, chest tightness, palpitations or swelling in the hands or feet.  Gastrointestinal: Denies abdominal pain, bloating, constipation, diarrhea or blood in the stool.  GU: Denies urgency, frequency, pain with urination, burning sensation, blood in urine, odor or discharge. Musculoskeletal: Pt reports intermittent joint pain.  Denies decrease in range of motion, difficulty with gait, muscle pain or joint swelling.  Skin: Denies redness, rashes, lesions or ulcercations.  Neurological: Denies dizziness, difficulty with memory, difficulty with speech or problems with balance and coordination.  Psych: Pt has a history of anxiety. Denies depression, SI/HI.  No other specific complaints in a complete review of systems (except as listed in HPI above).  Objective:  PE:   BP 126/80   Pulse 96   Temp 97.7 F (36.5 C) (Temporal)   Ht 5' 8.5" (1.74 m)   Wt 195 lb (88.5 kg)   SpO2 96%   BMI 29.22 kg/m   Wt Readings from Last 3 Encounters:  07/27/19 192 lb 11.2 oz (87.4 kg)  01/20/19 194 lb (88 kg)  10/11/15 205 lb (93 kg)    General: Appears his stated age, well developed, well nourished in NAD. Skin: Warm, dry and intact. No rashes noted. HEENT: Head: normal shape and size; Eyes: sclera white, no icterus, conjunctiva pink and EOMs intact  Neck: Neck supple, trachea midline. No masses, lumps or thyromegaly present.  Cardiovascular: Normal rate and rhythm. S1,S2 noted.  No murmur, rubs or gallops noted. No JVD or BLE edema. No carotid bruits noted. Pulmonary/Chest: Normal effort and positive vesicular breath sounds. No respiratory distress. No wheezes, rales or ronchi noted.  Abdomen: Soft and nontender. Normal bowel sounds. No distention or masses noted. Liver, spleen and kidneys non palpable. Musculoskeletal: Strength 5/5 BUE/BLE. No difficulty with gait.  Neurological: Alert and oriented. Cranial nerves II-XII grossly intact. Coordination normal.  Psychiatric: Mood and affect normal. Behavior is normal. Judgment and thought content normal.     BMET    Component Value Date/Time   NA 140 07/17/2017 1234   NA 138 07/12/2015 1005   K 4.5 07/17/2017 1234   K 4.6 07/12/2015 1005  CL 105 07/17/2017 1234   CO2 24 07/12/2015 1005   GLUCOSE 163 (H) 07/17/2017 1234   GLUCOSE 124 07/12/2015 1005   BUN 28 (H)  07/17/2017 1234   BUN 19.3 07/12/2015 1005   CREATININE 1.30 (H) 07/17/2017 1234   CREATININE 1.1 07/12/2015 1005   CALCIUM 9.2 07/12/2015 1005   GFRNONAA >90 07/03/2014 0505   GFRAA >90 07/03/2014 0505    Lipid Panel  No results found for: CHOL, TRIG, HDL, CHOLHDL, VLDL, LDLCALC  CBC    Component Value Date/Time   WBC 5.3 07/12/2015 1006   WBC 10.5 07/03/2014 0505   RBC 3.89 (L) 07/12/2015 1006   RBC 3.04 (L) 07/03/2014 0505   HGB 14.6 07/17/2017 1234   HGB 13.4 07/12/2015 1006   HCT 43.0 07/17/2017 1234   HCT 38.7 07/12/2015 1006   PLT 160 07/12/2015 1006   MCV 99.4 (H) 07/12/2015 1006   MCH 34.4 (H) 07/12/2015 1006   MCH 32.2 07/03/2014 0505   MCHC 34.6 07/12/2015 1006   MCHC 34.5 07/03/2014 0505   RDW 13.5 07/12/2015 1006   LYMPHSABS 1.6 07/12/2015 1006   MONOABS 0.6 07/12/2015 1006   EOSABS 0.2 07/12/2015 1006   BASOSABS 0.0 07/12/2015 1006    Hgb A1C Lab Results  Component Value Date   HGBA1C 6.0 (H) 06/29/2014      Assessment and Plan:   Medicare Annual Wellness Visit:  Diet: He eats veggies and fruits. He tries to consume a low-carb diet.He drinks mostly water and diet tea. Physical activity: Does some walking and outside garden work Depression/mood screen: Negative, PHQ score of 9 Hearing: Intact to whispered voice Visual acuity: Grossly normal, performs annual eye exam  ADLs: Capable Fall risk: None Home safety: Good Cognitive evaluation: Intact to orientation, naming, recall and repetition EOL planning: Adv directives, full code/ I agree  Preventative Medicine: Flu shot today. He declines tetanus for financial reasons. Pneumovax today. Prevnar and Shingrix UTD. Colon screening UTD. Encouraged him to consume a balanced diet and exercise regimen. Advised him to see an eye doctor and dentist annually. Will check CBC, CMET, Lipid, PSA and A1C today.   Next appointment: 6 months, follow up chronic conditions.   Webb Silversmith, NP

## 2019-11-08 NOTE — Assessment & Plan Note (Addendum)
Continue with Flomax as prescribed Will monitor

## 2019-11-08 NOTE — Assessment & Plan Note (Addendum)
Continue Albuterol prn Will monitor

## 2019-11-08 NOTE — Assessment & Plan Note (Addendum)
Controlled on Citalopram and Seroquel.  Continue follow up with psychiatry

## 2019-11-08 NOTE — Assessment & Plan Note (Addendum)
CMET and lipid profile today Encouraged to consume a low fat diet.  Will continue to monitor.

## 2019-11-08 NOTE — Assessment & Plan Note (Addendum)
Continue with Oxycodoen, Gabapentin and Flexeril as prescribed- managed by [facility] Will monitor

## 2019-11-08 NOTE — Assessment & Plan Note (Addendum)
A1C today Encouraged her to consume a low carb diet and exercise for weight loss Continue Metformin, Glipizide and Lisinopril as prescribed Last eye exam was in 2019 Foot exam today Will monitor

## 2019-11-09 LAB — CBC
HCT: 40.8 % (ref 39.0–52.0)
Hemoglobin: 13.6 g/dL (ref 13.0–17.0)
MCHC: 33.4 g/dL (ref 30.0–36.0)
MCV: 97.4 fl (ref 78.0–100.0)
Platelets: 154 10*3/uL (ref 150.0–400.0)
RBC: 4.19 Mil/uL — ABNORMAL LOW (ref 4.22–5.81)
RDW: 13.9 % (ref 11.5–15.5)
WBC: 7.8 10*3/uL (ref 4.0–10.5)

## 2019-11-09 LAB — HEMOGLOBIN A1C: Hgb A1c MFr Bld: 8.2 % — ABNORMAL HIGH (ref 4.6–6.5)

## 2019-11-09 LAB — COMPREHENSIVE METABOLIC PANEL
ALT: 57 U/L — ABNORMAL HIGH (ref 0–53)
AST: 36 U/L (ref 0–37)
Albumin: 4 g/dL (ref 3.5–5.2)
Alkaline Phosphatase: 89 U/L (ref 39–117)
BUN: 21 mg/dL (ref 6–23)
CO2: 23 mEq/L (ref 19–32)
Calcium: 9.2 mg/dL (ref 8.4–10.5)
Chloride: 105 mEq/L (ref 96–112)
Creatinine, Ser: 1.02 mg/dL (ref 0.40–1.50)
GFR: 71.85 mL/min (ref >60.00)
Glucose, Bld: 132 mg/dL — ABNORMAL HIGH (ref 70–99)
Potassium: 4.5 mEq/L (ref 3.5–5.1)
Sodium: 140 mEq/L (ref 135–145)
Total Bilirubin: 0.5 mg/dL (ref 0.2–1.2)
Total Protein: 6.8 g/dL (ref 6.0–8.3)

## 2019-11-09 LAB — LIPID PANEL
Cholesterol: 128 mg/dL (ref 0–200)
HDL: 37.2 mg/dL — ABNORMAL LOW (ref >39.00)
NonHDL: 91.28
Total CHOL/HDL Ratio: 3
Triglycerides: 279 mg/dL — ABNORMAL HIGH (ref 0.0–149.0)
VLDL: 55.8 mg/dL — ABNORMAL HIGH (ref 0.0–40.0)

## 2019-11-09 LAB — LDL CHOLESTEROL, DIRECT: Direct LDL: 53 mg/dL

## 2019-11-09 LAB — PSA, MEDICARE: PSA: 1.65 ng/ml (ref 0.10–4.00)

## 2019-11-14 NOTE — Assessment & Plan Note (Signed)
Continue Gabapentin

## 2019-11-14 NOTE — Addendum Note (Signed)
Addended by: Lurlean Nanny on: 11/14/2019 11:27 AM   Modules accepted: Orders

## 2020-09-07 ENCOUNTER — Telehealth: Payer: Self-pay

## 2020-09-07 NOTE — Telephone Encounter (Signed)
[facility] - Client TELEPHONE ADVICE RECORD [facility] Patient Name: BON DOWIS Gender: Male DOB: 06-Dec-1946 Age: 74 Y 16 M 21 D Return Phone Number: [phone] (Primary), [phone] (Secondary) Address[place] [address] Client [place] [facility] - Client Client Site [facility] - Day Physician Webb Silversmith - NP Contact Type Call Who Is Calling Patient / Member / Family / Caregiver Call Type Triage / Clinical Relationship To Patient Self Return Phone Number [phone] (Primary) Chief Complaint Abdominal Pain Reason for Call Symptomatic / Request for Health Information Initial Comment Transferred from answering service. PT thinks he has kidney pain, pain level 8-9. Pain is on left side belt line. Translation No Nurse Assessment Nurse: Derrel Nip, RN, Santiago Glad Date/Time Eilene Ghazi Time): 09/07/2020 2:01:10 PM Confirm and document reason for call. If symptomatic, describe symptoms. ---Caller states that he is having pain on the left side towards the back above the belt line - the pain started with in the past two weeks and has been getting worse - the pain level is 8-9/10 consistently Does the patient have any new or worsening symptoms? ---Yes Will a triage be completed? ---Yes Related visit to physician within the last 2 weeks? ---No Does the PT have any chronic conditions? (i.e. diabetes, asthma, this includes High risk factors for pregnancy, etc.) ---Yes List chronic conditions. ---Prostate, HTN, Cholesterol, Arthritis Is this a behavioral health or substance abuse call? ---No Guidelines Guideline Title Affirmed Question Affirmed Notes Nurse Date/Time (Eastern Time) Back Pain High-risk adult (e.g., history of cancer, HIV, or IV drug use) Derrel Nip, RN, Santiago Glad 09/07/2020 2:05:11 PM Disp. Time Eilene Ghazi Time) Disposition Final User 09/07/2020 2:10:02 PM See PCP within 24 Hours Yes Derrel Nip, RN,  [place] Pellant Disagree/Comply Comply PLEASE NOTE: All timestamps contained within this report are represented as [place] Standard Time. CONFIDENTIALTY NOTICE: This fax transmission is intended only for the addressee. It contains information that is legally privileged, confidential or otherwise protected from use or disclosure. If you are not the intended recipient, you are strictly prohibited from reviewing, disclosing, copying using or disseminating any of this information or taking any action in reliance on or regarding this information. If you have received this fax in error, please notify us immediately by telephone so that we can arrange for its return to [place]. Phone: [phone], Toll-Free: [phone], Fax: [phone] Page: 2 of 2 Call Id: [id number] [place] Understands Yes PreDisposition Call Doctor Care Advice Given Per Guideline SEE PCP WITHIN 24 HOURS: NOTE TO TRIAGER: * Use nurse judgment to select the most appropriate source of care. * IF OFFICE WILL BE CLOSED: You need to be seen within the next 24 hours. A clinic or an urgent care center is often a good source of care if your doctor's office is closed or you can't get an appointment. PAIN MEDICINES: * ACETAMINOPHEN - REGULAR STRENGTH TYLENOL: Take 650 mg (two 325 mg pills) by mouth every 4 to 6 hours as needed. Each Regular Strength Tylenol pill has 325 mg of acetaminophen. The most you should take each day is 3,250 mg (10 pills a day). * IBUPROFEN (E.G., MOTRIN, ADVIL): Take 400 mg (two 200 mg pills) by mouth every 6 hours. The most you should take each day is 1,200 mg (six 200 mg pills), unless your doctor has told you to take more. CALL BACK IF: * You become worse CARE ADVICE given per Back Pain (Adult) guideline. Referrals Warm transfer to backline

## 2020-09-07 NOTE — Telephone Encounter (Signed)
Unable to reach pt or pts wife by phone and left v/m for pt.

## 2020-09-10 ENCOUNTER — Encounter: Payer: Self-pay | Admitting: Internal Medicine

## 2020-09-10 ENCOUNTER — Other Ambulatory Visit: Payer: Self-pay

## 2020-09-10 ENCOUNTER — Ambulatory Visit (INDEPENDENT_AMBULATORY_CARE_PROVIDER_SITE_OTHER): Payer: Medicare Other | Admitting: Internal Medicine

## 2020-09-10 VITALS — BP 130/80 | HR 87 | Temp 96.9°F | Wt 196.0 lb

## 2020-09-10 DIAGNOSIS — M545 Low back pain, unspecified: Secondary | ICD-10-CM

## 2020-09-10 DIAGNOSIS — Z23 Encounter for immunization: Secondary | ICD-10-CM

## 2020-09-10 LAB — POC URINALSYSI DIPSTICK (AUTOMATED)
Bilirubin, UA: NEGATIVE
Blood, UA: NEGATIVE
Glucose, UA: POSITIVE — AB
Leukocytes, UA: NEGATIVE
Nitrite, UA: NEGATIVE
Protein, UA: NEGATIVE
Spec Grav, UA: 1.02 (ref 1.010–1.025)
Urobilinogen, UA: 0.2 E.U./dL
pH, UA: 6 (ref 5.0–8.0)

## 2020-09-10 MED ORDER — KETOROLAC TROMETHAMINE 30 MG/ML IJ SOLN
30.0000 mg | Freq: Once | INTRAMUSCULAR | Status: AC
  Administered 2020-09-10: 30 mg via INTRAMUSCULAR

## 2020-09-10 MED ORDER — CYCLOBENZAPRINE HCL 10 MG PO TABS: 10.0000 mg | ORAL_TABLET | Freq: Every day | ORAL | 0 refills | Status: DC

## 2020-09-10 NOTE — Addendum Note (Signed)
Addended by: Lurlean Nanny on: 09/10/2020 11:15 AM   Modules accepted: Orders

## 2020-09-10 NOTE — Progress Notes (Signed)
Subjective:    Patient ID: Matthew [name], male    DOB: 1946/03/09, 74 y.o.   MRN: [id number]  HPI  Pt presents to the clinic today with c/o left lower back pain. This started 2 weeks ago. He describes the pain as sharp. The pain does not radiate. He denies numbness, tingling or weakness of his left lower extremity. He denies abdominal pain, urinary frequency, urgency, dysuria or blood in his urine. He denies testicular pain or swelling. He denies any injury to the area. He has had 3 back surgeries in the past. He has taken Excedrin OTC with minimal relief of symptoms.   Review of Systems      Past Medical History:  Diagnosis Date  . Anxiety   . Arthritis   . Bipolar 1 disorder ([place])   . Chronic back pain   . 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 Complication of anesthesia    "I GET REAL COLD AND CAN'T URINATE"---  urinary retention  . COPD with emphysema ([place])   . Cough   . Diverticulosis of colon   . GERD (gastroesophageal reflux disease)   . History of adenomatous polyp of colon   . History of bleeding peptic ulcer    2000  . History of diverticulitis of colon   . History of panic attacks   . Hyperlipidemia   . Osteoporosis   . PTSD (post-traumatic stress disorder)    [place]  . Rectus diastasis   . Wears dentures    upper    Current Outpatient Medications  Medication Sig Dispense Refill  . acyclovir (ZOVIRAX) 200 MG capsule Take 200 mg by mouth 3 times/day as needed-between meals & bedtime.    Marland Kitchen albuterol (PROVENTIL HFA;VENTOLIN HFA) 108 (90 BASE) MCG/ACT inhaler Inhale into the lungs every 6 (six) hours as needed for wheezing or shortness of breath.    . citalopram (CELEXA) 40 MG tablet Take 40 mg by mouth at bedtime.    . cyclobenzaprine (FLEXERIL) 10 MG tablet Take 10 mg by mouth 3 (three) times daily.     Marland Kitchen  DM-Phenylephrine-Acetaminophen 10-5-325 MG/15ML LIQD Take by mouth as needed.    . docusate sodium (COLACE) 100 MG capsule Take 200 mg by mouth at bedtime.    Marland Kitchen EPINEPHrine 0.3 mg/0.3 mL IJ SOAJ injection Inject 0.3 mg into the muscle daily as needed (allergic reaction).    Marland Kitchen ergocalciferol (VITAMIN D2) 50000 UNITS capsule Take 50,000 Units by mouth 2 (two) times a week.    . gabapentin (NEURONTIN) 300 MG capsule Take 300 mg by mouth 3 (three) times daily.    Marland Kitchen glipiZIDE (GLUCOTROL) 10 MG tablet Take 1 tablet by mouth 2 (two) times daily.    Marland Kitchen JARDIANCE 25 MG TABS tablet Take 25 mg by mouth daily.    Marland Kitchen levothyroxine (SYNTHROID) 50 MCG tablet Take 50 mcg by mouth daily before breakfast.    . lisinopril (PRINIVIL,ZESTRIL) 5 MG tablet     . metFORMIN (GLUCOPHAGE) 1000 MG tablet Take 1 tablet by mouth 2 (two) times daily.    . Multiple Vitamin (MULTIVITAMIN) tablet Take 1 tablet by mouth daily.    . Omega-3 Fatty Acids (FISH OIL) 1000 MG CAPS Take by mouth.    Marland Kitchen omeprazole (PRILOSEC) 20 MG capsule Take 20 mg by mouth daily with breakfast.     . ondansetron (ZOFRAN-ODT)  8 MG disintegrating tablet Take 1 tablet (8 mg total) by mouth every 8 (eight) hours as needed for nausea. 12 tablet 0  . oxyCODONE (ROXICODONE) 15 MG immediate release tablet Take 15 mg by mouth 3 (three) times daily as needed.    . polyethylene glycol (MIRALAX / GLYCOLAX) packet Take 17 g by mouth daily as needed.     . pravastatin (PRAVACHOL) 40 MG tablet Take 20 mg by mouth at bedtime.     Marland Kitchen QUEtiapine (SEROQUEL) 400 MG tablet Take 400 mg by mouth at bedtime.    . tamsulosin (FLOMAX) 0.4 MG CAPS capsule Take 0.4 mg by mouth 2 (two) times daily.    . vitamin B-12 (CYANOCOBALAMIN) 1000 MCG tablet Take 1,000 mcg by mouth daily.     No current facility-administered medications for this visit.    Allergies  Allergen Reactions  . Bee Venom Anaphylaxis  . Ibuprofen Shortness Of Breath and Swelling    Family History  Problem  Relation Age of Onset  . Heart disease Mother     Social History   Socioeconomic History  . Marital status: Married    Spouse name: Caren Griffins  . Number of children: 2  . Years of education: Not on file  . Highest education level: Not on file  Occupational History  . Occupation: retired    Comment: trucking  Tobacco Use  . Smoking status: Former Smoker    Packs/day: 1.00    Years: 30.00    Pack years: 30.00    Types: Cigarettes    Quit date: 06/23/2004    Years since quitting: 16.2  . Smokeless tobacco: Never Used  Substance and Sexual Activity  . Alcohol use: No  . Drug use: No  . Sexual activity: Not on file  Other Topics Concern  . Not on file  Social History Narrative   Married to wife, Caren Griffins for 45+ years   #2 grown children: Otila Kluver and Antony Haste   #3 grandchildren- youngest 76 years   Retired Advice worker    Social Determinants of Radio broadcast assistant Strain:   . Difficulty of Paying Living Expenses: Not on file  Food Insecurity:   . Worried About Charity fundraiser in the Last Year: Not on file  . Ran Out of Food in the Last Year: Not on file  Transportation Needs:   . Lack of Transportation (Medical): Not on file  . Lack of Transportation (Non-Medical): Not on file  Physical Activity:   . Days of Exercise per Week: Not on file  . Minutes of Exercise per Session: Not on file  Stress:   . Feeling of Stress : Not on file  Social Connections:   . Frequency of Communication with Friends and Family: Not on file  . Frequency of Social Gatherings with Friends and Family: Not on file  . Attends Religious Services: Not on file  . Active Member of Clubs or Organizations: Not on file  . Attends Archivist Meetings: Not on file  . Marital Status: Not on file  Intimate Partner Violence:   . Fear of Current or Ex-Partner: Not on file  . Emotionally Abused: Not on file  . Physically Abused: Not on file  . Sexually Abused: Not on file      Constitutional: Denies fever, malaise, fatigue, headache or abrupt weight changes.  Respiratory: Denies difficulty breathing, shortness of breath, cough or sputum production.   Cardiovascular: Denies chest pain, chest tightness, palpitations or swelling in the hands  or feet.  Gastrointestinal: Denies abdominal pain, bloating, constipation, diarrhea or blood in the stool.  GU: Denies urgency, frequency, pain with urination, burning sensation, blood in urine, odor or discharge. Musculoskeletal: Pt reports left lower back pain. Denies decrease in range of motion, difficulty with gait, or joint pain or swelling.  Skin: Denies redness, rashes, lesions or ulcercations.  Neurological: Denies numbness, tingling, weakness or problems with balance and coordination.   No other specific complaints in a complete review of systems (except as listed in HPI above).  Objective:   Physical Exam   Wt Readings from Last 3 Encounters:  11/08/19 195 lb (88.5 kg)  07/27/19 192 lb 11.2 oz (87.4 kg)  01/20/19 194 lb (88 kg)    General: Appears his stated age, obese, in NAD. Skin: Warm, dry and intact. No rashes noted. Cardiovascular: Normal rate and rhythm. S1,S2 noted.  No murmur, rubs or gallops noted.  Pulmonary/Chest: Normal effort and positive vesicular breath sounds. No respiratory distress. No wheezes, rales or ronchi noted.  Abdomen: No CVA tenderness noted. Musculoskeletal: Decreased flexion, extension and rotation of the spine due to prior surgeries. No bony tenderness noted over the lumbar spine. Pain with palpation just above the posterior iliac crest on the left. Normal abduction, adduction, internal and external rotation of the left hip. No difficulty with gait. Neurological: Alert and oriented.    BMET    Component Value Date/Time   NA 140 11/08/2019 1540   NA 138 07/12/2015 1005   K 4.5 11/08/2019 1540   K 4.6 07/12/2015 1005   CL 105 11/08/2019 1540   CO2 23 11/08/2019 1540    CO2 24 07/12/2015 1005   GLUCOSE 132 (H) 11/08/2019 1540   GLUCOSE 124 07/12/2015 1005   BUN 21 11/08/2019 1540   BUN 19.3 07/12/2015 1005   CREATININE 1.02 11/08/2019 1540   CREATININE 1.1 07/12/2015 1005   CALCIUM 9.2 11/08/2019 1540   CALCIUM 9.2 07/12/2015 1005   GFRNONAA >90 07/03/2014 0505   GFRAA >90 07/03/2014 0505    Lipid Panel     Component Value Date/Time   CHOL 128 11/08/2019 1540   TRIG 279.0 (H) 11/08/2019 1540   HDL 37.20 (L) 11/08/2019 1540   CHOLHDL 3 11/08/2019 1540   VLDL 55.8 (H) 11/08/2019 1540    CBC    Component Value Date/Time   WBC 7.8 11/08/2019 1540   RBC 4.19 (L) 11/08/2019 1540   HGB 13.6 11/08/2019 1540   HGB 13.4 07/12/2015 1006   HCT 40.8 11/08/2019 1540   HCT 38.7 07/12/2015 1006   PLT 154.0 11/08/2019 1540   PLT 160 07/12/2015 1006   MCV 97.4 11/08/2019 1540   MCV 99.4 (H) 07/12/2015 1006   MCH 34.4 (H) 07/12/2015 1006   MCH 32.2 07/03/2014 0505   MCHC 33.4 11/08/2019 1540   RDW 13.9 11/08/2019 1540   RDW 13.5 07/12/2015 1006   LYMPHSABS 1.6 07/12/2015 1006   MONOABS 0.6 07/12/2015 1006   EOSABS 0.2 07/12/2015 1006   BASOSABS 0.0 07/12/2015 1006    Hgb A1C Lab Results  Component Value Date   HGBA1C 8.2 (H) 11/08/2019           Assessment & Plan:   Left Lower Back Pain:  Urinalysis: 3+ glucose (hx of DM 2) No need to send urine culture This seems muscular, not kidney related to me  Toradol 30 mg IM today Flexeril 10 mg PO QHS prn- sedation caution given Encouraged heat and stretching  Return precautions discussed  Webb Silversmith, NP This visit occurred during the SARS-CoV-2 public health emergency.  Safety protocols were in place, including screening questions prior to the visit, additional usage of staff PPE, and extensive cleaning of exam room while observing appropriate contact time as indicated for disinfecting solutions.

## 2020-09-10 NOTE — Patient Instructions (Signed)

## 2020-09-11 NOTE — Addendum Note (Signed)
Addended by: Lurlean Nanny on: 09/11/2020 12:14 PM   Modules accepted: Orders

## 2021-02-12 DIAGNOSIS — L821 Other seborrheic keratosis: Secondary | ICD-10-CM | POA: Diagnosis not present

## 2021-02-12 DIAGNOSIS — D225 Melanocytic nevi of trunk: Secondary | ICD-10-CM | POA: Diagnosis not present

## 2021-02-12 DIAGNOSIS — L820 Inflamed seborrheic keratosis: Secondary | ICD-10-CM | POA: Diagnosis not present

## 2021-02-12 DIAGNOSIS — L218 Other seborrheic dermatitis: Secondary | ICD-10-CM | POA: Diagnosis not present

## 2021-02-12 DIAGNOSIS — L570 Actinic keratosis: Secondary | ICD-10-CM | POA: Diagnosis not present

## 2021-02-12 DIAGNOSIS — L308 Other specified dermatitis: Secondary | ICD-10-CM | POA: Diagnosis not present

## 2021-04-26 ENCOUNTER — Emergency Department (HOSPITAL_COMMUNITY): Payer: No Typology Code available for payment source

## 2021-04-26 ENCOUNTER — Emergency Department (HOSPITAL_COMMUNITY)
Admission: EM | Admit: 2021-04-26 | Discharge: 2021-04-26 | Disposition: A | Discharge status: Home / Self Care | Payer: No Typology Code available for payment source | Attending: Emergency Medicine | Admitting: Emergency Medicine

## 2021-04-26 ENCOUNTER — Other Ambulatory Visit: Payer: Self-pay

## 2021-04-26 ENCOUNTER — Encounter (HOSPITAL_COMMUNITY): Payer: Self-pay | Admitting: *Deleted

## 2021-04-26 DIAGNOSIS — Z87891 Personal history of nicotine dependence: Secondary | ICD-10-CM | POA: Diagnosis not present

## 2021-04-26 DIAGNOSIS — Z85038 Personal history of other malignant neoplasm of large intestine: Secondary | ICD-10-CM | POA: Diagnosis not present

## 2021-04-26 DIAGNOSIS — Z79899 Other long term (current) drug therapy: Secondary | ICD-10-CM | POA: Diagnosis not present

## 2021-04-26 DIAGNOSIS — R002 Palpitations: Secondary | ICD-10-CM | POA: Insufficient documentation

## 2021-04-26 DIAGNOSIS — J449 Chronic obstructive pulmonary disease, unspecified: Secondary | ICD-10-CM | POA: Insufficient documentation

## 2021-04-26 DIAGNOSIS — R000 Tachycardia, unspecified: Secondary | ICD-10-CM | POA: Diagnosis not present

## 2021-04-26 DIAGNOSIS — R059 Cough, unspecified: Secondary | ICD-10-CM | POA: Diagnosis not present

## 2021-04-26 DIAGNOSIS — Z7984 Long term (current) use of oral hypoglycemic drugs: Secondary | ICD-10-CM | POA: Insufficient documentation

## 2021-04-26 DIAGNOSIS — Z9221 Personal history of antineoplastic chemotherapy: Secondary | ICD-10-CM | POA: Diagnosis not present

## 2021-04-26 LAB — CBC WITH DIFFERENTIAL/PLATELET
Abs Immature Granulocytes: 0.05 10*3/uL (ref 0.00–0.07)
Basophils Absolute: 0 10*3/uL (ref 0.0–0.1)
Basophils Relative: 0 %
Eosinophils Absolute: 0.1 10*3/uL (ref 0.0–0.5)
Eosinophils Relative: 1 %
HCT: 43 % (ref 39.0–52.0)
Hemoglobin: 14.1 g/dL (ref 13.0–17.0)
Immature Granulocytes: 1 %
Lymphocytes Relative: 15 %
Lymphs Abs: 1.1 10*3/uL (ref 0.7–4.0)
MCH: 33.2 pg (ref 26.0–34.0)
MCHC: 32.8 g/dL (ref 30.0–36.0)
MCV: 101.2 fL — ABNORMAL HIGH (ref 80.0–100.0)
Monocytes Absolute: 0.5 10*3/uL (ref 0.1–1.0)
Monocytes Relative: 7 %
Neutro Abs: 5.5 10*3/uL (ref 1.7–7.7)
Neutrophils Relative %: 76 %
Platelets: 146 10*3/uL — ABNORMAL LOW (ref 150–400)
RBC: 4.25 MIL/uL (ref 4.22–5.81)
RDW: 13.8 % (ref 11.5–15.5)
WBC: 7.3 10*3/uL (ref 4.0–10.5)
nRBC: 0 % (ref 0.0–0.2)

## 2021-04-26 LAB — BASIC METABOLIC PANEL
Anion gap: 11 (ref 5–15)
BUN: 17 mg/dL (ref 8–23)
CO2: 22 mmol/L (ref 22–32)
Calcium: 9 mg/dL (ref 8.9–10.3)
Chloride: 102 mmol/L (ref 98–111)
Creatinine, Ser: 1.15 mg/dL (ref 0.61–1.24)
GFR, Estimated: >60 mL/min (ref >60)
Glucose, Bld: 220 mg/dL — ABNORMAL HIGH (ref 70–99)
Potassium: 4.7 mmol/L (ref 3.5–5.1)
Sodium: 135 mmol/L (ref 135–145)

## 2021-04-26 LAB — D-DIMER, QUANTITATIVE: D-Dimer, Quant: 0.64 ug/mL-FEU — ABNORMAL HIGH (ref 0.00–0.50)

## 2021-04-26 LAB — TSH: TSH: 1.377 u[IU]/mL (ref 0.350–4.500)

## 2021-04-26 LAB — TROPONIN I (HIGH SENSITIVITY)
Troponin I (High Sensitivity): 4 ng/L (ref <18)
Troponin I (High Sensitivity): 5 ng/L (ref <18)

## 2021-04-26 MED ORDER — SODIUM CHLORIDE 0.9 % IV BOLUS: 500.0000 mL | Freq: Once | INTRAVENOUS | Status: DC

## 2021-04-26 MED ORDER — SODIUM CHLORIDE 0.9 % IV BOLUS
1000.0000 mL | Freq: Once | INTRAVENOUS | Status: AC
  Administered 2021-04-26: 1000 mL via INTRAVENOUS

## 2021-04-26 NOTE — ED Triage Notes (Signed)
Sent from [place] for evaluation of fast heart rate. Denies chest pain

## 2021-04-26 NOTE — ED Provider Notes (Addendum)
[facility] EMERGENCY DEPARTMENT Provider Note   CSN: [id number] Arrival date & time: 04/26/21  1302     History Chief Complaint  Patient presents with  . Tachycardia    Matthew [name] is a 75 y.o. male with PMH of HTN, HLD, type II DM, COPD, stage III BC, adenocarcinoma s/p hemicolectomy and chemotherapy, and bipolar 1 disorder who presents the ED sent from [facility] for elevated heart rate.  On my examination, patient states that he lost his 59 year old granddaughter in April and ever since then he has been experiencing periodic episodes of left-sided chest pressure with associated palpitations.  He had a primary care provider with [place], Linzie Collin NP, but she recently left the practice and he is looking to reestablish with a new provider there.  He had also seen a primary care provider at the [place] today who advised him to go to the ED given elevated heart rate.  He brought an EKG with him that showed normal sinus rhythm at 97 bpm, but he states that at one point his heart rate was in the 140s which prompted their concern.  He has a 40-pack-year smoking history, but quit in 2006.  He has had a mild cough recently, but denies any obvious fevers, chills, body aches, abdominal discomfort, nausea or vomiting, urinary symptoms, or changes in bowel habits that might otherwise suggest active infection.  He states that the passing of his granddaughter has been hard for both him and his family.  When asked if he had ever seen a cardiologist, he states that he did years ago, nobody recently.  He is not established.  He asked that he did not feel as though he needed to come to the ED for evaluation, but I respect to the provider at the [place] he agreed to come and be evaluated.  His only medical complaint at all is that sometimes when he stands up from seated position he is briefly lightheaded.  He denies any recent fevers, chills, body aches, illicit drug use, increased urinary frequency or dysuria, hematuria,  changes in his bowel habits, abdominal pain, nausea vomiting, or any other symptoms.  He feels entirely well and states that he was simply sent here because his heart rate was as high as 120.  HPI     Past Medical History:  Diagnosis Date  . Anxiety   . Arthritis   . Bipolar 1 disorder ([place])   . Chronic back pain   . Colon cancer [facility]) oncologist-  dr Benay Spice   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--  s/p  right hemicolectomy 06-30-2014  and chemotherapy complete 01-10-2015  . Complication of anesthesia    "I GET REAL COLD AND CAN'T URINATE"---  urinary retention  . COPD with emphysema ([place])   . Cough   . Diverticulosis of colon   . GERD (gastroesophageal reflux disease)   . History of adenomatous polyp of colon   . History of bleeding peptic ulcer    2000  . History of diverticulitis of colon   . History of panic attacks   . Hyperlipidemia   . Osteoporosis   . PTSD (post-traumatic stress disorder)    [place]  . Rectus diastasis   . Wears dentures    upper    Patient Active Problem List   Diagnosis Date Noted  . Drug-induced polyneuropathy ([place]) 11/08/2019  . GAD (generalized anxiety disorder) 01/20/2019  . Bipolar 1 disorder ([place]) 01/20/2019  . PTSD (post-traumatic stress disorder) 01/20/2019  .  OA (osteoarthritis) 01/20/2019  . COPD (chronic obstructive pulmonary disease) ([place]) 01/20/2019  . GERD (gastroesophageal reflux disease) 01/20/2019  . PUD (peptic ulcer disease) 01/20/2019  . DM (diabetes mellitus), type 2 ([place]) 01/20/2019  . BPH (benign prostatic hyperplasia) 01/20/2019  . History of cold sores 01/20/2019  . HLD (hyperlipidemia) 01/20/2019  . Colon cancer ([place]) 06/06/2014    Past Surgical History:  Procedure Laterality Date  . ANTERIOR CERVICAL DECOMP/DISCECTOMY FUSION  04-18-2003   C5 -- 6  . APPENDECTOMY  yrs ago  . BACK SURGERY  [phone]   lumbar lam and fusions  . CIRCUMCISION  03-05-2005  . COLONOSCOPY  [id number]   . LAPAROSCOPIC PARTIAL COLECTOMY N/A 06/30/2014   Procedure: LAPAROSCOPIC right COLECTOMY;  Surgeon: [name], MD;  Location: [facility];  Service: General;  Laterality: N/A;  . PORT-A-CATH REMOVAL Left 10/11/2015   Procedure: REMOVAL PORT-A-CATH;  Surgeon: [name], MD;  Location: [facility];  Service: General;  Laterality: Left;  . PORTACATH PLACEMENT Left 07/31/2014   Procedure: INSERTION PORT-A-CATH;  Surgeon: Stark Klein, MD;  Location: [place];  Service: General;  Laterality: Left;  . UPPER GI ENDOSCOPY  2015       Family History  Problem Relation Age of Onset  . Heart disease Mother     Social History   Tobacco Use  . Smoking status: Former Smoker    Packs/day: 1.00    Years: 30.00    Pack years: 30.00    Types: Cigarettes    Quit date: 06/23/2004    Years since quitting: 16.8  . Smokeless tobacco: Never Used  Substance Use Topics  . Alcohol use: No  . Drug use: No    Home Medications Prior to Admission medications   Medication Sig Start Date End Date Taking? Authorizing Provider  citalopram (CELEXA) 40 MG tablet Take 40 mg by mouth at bedtime.    [provider]  cyclobenzaprine (FLEXERIL) 10 MG tablet Take 1 tablet (10 mg total) by mouth at bedtime. 09/10/20   Jearld Fenton, NP  DM-Phenylephrine-Acetaminophen 10-5-325 MG/15ML LIQD Take by mouth as needed.    [provider]  EPINEPHrine 0.3 mg/0.3 mL IJ SOAJ injection Inject 0.3 mg into the muscle daily as needed (allergic reaction).    [provider]  ergocalciferol (VITAMIN D2) 50000 UNITS capsule Take 50,000 Units by mouth 2 (two) times a week.    [provider]  gabapentin (NEURONTIN) 300 MG capsule Take 300 mg by mouth 3 (three) times daily.    [provider]  glipiZIDE (GLUCOTROL) 10 MG tablet Take 1 tablet by mouth 2 (two) times daily. 10/27/18   [provider]  JARDIANCE 25 MG TABS tablet Take 25 mg by mouth  daily. 09/14/19   [provider]  levothyroxine (SYNTHROID) 50 MCG tablet Take 50 mcg by mouth daily before breakfast.    [provider]  lisinopril (PRINIVIL,ZESTRIL) 5 MG tablet  12/01/18   [provider]  metFORMIN (GLUCOPHAGE) 1000 MG tablet Take 1 tablet by mouth 2 (two) times daily. 11/23/18   [provider]  Multiple Vitamin (MULTIVITAMIN) tablet Take 1 tablet by mouth daily.    [provider]  Omega-3 Fatty Acids (FISH OIL) 1000 MG CAPS Take by mouth.    [provider]  omeprazole (PRILOSEC) 20 MG capsule Take 20 mg by mouth daily with breakfast.  04/04/14   [provider]  ondansetron (ZOFRAN-ODT) 8 MG disintegrating tablet Take 1 tablet (8  mg total) by mouth every 8 (eight) hours as needed for nausea. 07/27/19   Elby Beck, FNP  oxyCODONE (OXY IR/ROXICODONE) 5 MG immediate release tablet Take by mouth. 08/31/20   [provider]  polyethylene glycol (MIRALAX / GLYCOLAX) packet Take 17 g by mouth daily as needed.     [provider]  pravastatin (PRAVACHOL) 40 MG tablet Take 20 mg by mouth at bedtime.  03/16/14   [provider]  QUEtiapine (SEROQUEL) 400 MG tablet Take 400 mg by mouth at bedtime.    [provider]  tamsulosin (FLOMAX) 0.4 MG CAPS capsule Take 0.4 mg by mouth 2 (two) times daily.    [provider]  vitamin B-12 (CYANOCOBALAMIN) 1000 MCG tablet Take 1,000 mcg by mouth daily.    [provider]    Allergies    Bee venom and Ibuprofen  Review of Systems   Review of Systems  All other systems reviewed and are negative.   Physical Exam Updated Vital Signs BP 128/81   Pulse 89   Temp 98 F (36.7 C) (Oral)   Resp 18   Ht 5\' 8"  (1.727 m)   Wt 88.9 kg   SpO2 95%   BMI 29.80 kg/m   Physical Exam Vitals and nursing note reviewed. Exam conducted with a chaperone present.  Constitutional:      General: He is not in acute distress.     Appearance: He is not toxic-appearing.  HENT:     Head: Normocephalic and atraumatic.  Eyes:     General: No scleral icterus.    Conjunctiva/sclera: Conjunctivae normal.  Cardiovascular:     Rate and Rhythm: Regular rhythm. Tachycardia present.     Pulses: Normal pulses.  Pulmonary:     Effort: Pulmonary effort is normal. No respiratory distress.  Abdominal:     General: Abdomen is flat. There is no distension.     Palpations: Abdomen is soft.     Tenderness: There is no abdominal tenderness.  Musculoskeletal:     Right lower leg: No edema.     Left lower leg: No edema.  Skin:    General: Skin is dry.     Capillary Refill: Capillary refill takes less than 2 seconds.  Neurological:     Mental Status: He is alert and oriented to person, place, and time.     GCS: GCS eye subscore is 4. GCS verbal subscore is 5. GCS motor subscore is 6.  Psychiatric:        Mood and Affect: Mood normal.        Behavior: Behavior normal.        Thought Content: Thought content normal.     ED Results / Procedures / Treatments   Labs (all labs ordered are listed, but only abnormal results are displayed) Labs Reviewed  BASIC METABOLIC PANEL - Abnormal; Notable for the following components:      Result Value   Glucose, Bld 220 (*)    All other components within normal limits  CBC WITH DIFFERENTIAL/PLATELET - Abnormal; Notable for the following components:   MCV 101.2 (*)    Platelets 146 (*)    All other components within normal limits  D-DIMER, QUANTITATIVE - Abnormal; Notable for the following components:   D-Dimer, Quant 0.64 (*)    All other components within normal limits  TSH  TROPONIN I (HIGH SENSITIVITY)  TROPONIN I (HIGH SENSITIVITY)    EKG EKG Interpretation  Date/Time:  Friday Apr 26 2021 13:18:11  EDT Ventricular Rate:  113 PR Interval:  160 QRS Duration: 86 QT Interval:  326 QTC Calculation: 447 R Axis:   -66 Text Interpretation: Sinus tachycardia Left axis deviation  Low voltage QRS Possible Inferior infarct , age undetermined Abnormal ECG Confirmed by Elnora Morrison [phone]) on 04/26/2021 3:04:05 PM   Radiology DG Chest 2 View  Result Date: 04/26/2021 CLINICAL DATA:  Tachycardia and blurred vision EXAM: CHEST - 2 VIEW COMPARISON:  July 31, 2014 FINDINGS: Lungs are clear. Heart size and pulmonary vascularity are normal. No adenopathy. There is aortic atherosclerosis. There is postoperative change in the cervicothoracic junction region. IMPRESSION: Lungs clear. Cardiac silhouette normal. Aortic Atherosclerosis (ICD10-I70.0). Electronically Signed   By: Lowella Grip III M.D.   On: 04/26/2021 13:57    Procedures Procedures   Medications Ordered in ED Medications  sodium chloride 0.9 % bolus 1,000 mL (0 mLs Intravenous Stopped 04/26/21 1553)    ED Course  I have reviewed the triage vital signs and the nursing notes.  Pertinent labs & imaging results that were available during my care of the patient were reviewed by me and considered in my medical decision making (see chart for details).    MDM Rules/Calculators/A&P                          Matthew [name] was evaluated in Emergency Department on 04/26/2021 for the symptoms described in the history of present illness. He was evaluated in the context of the global COVID-19 pandemic, which necessitated consideration that the patient might be at risk for infection with the SARS-CoV-2 virus that causes COVID-19. Institutional protocols and algorithms that pertain to the evaluation of patients at risk for COVID-19 are in a state of rapid change based on information released by regulatory bodies including the CDC and federal and state organizations. These policies and algorithms were followed during the patient's care in the ED.  I personally reviewed patient's medical chart and all notes from triage and staff during today's encounter. I have also ordered and reviewed all labs and imaging that I felt to be  medically necessary in the evaluation of this patient's complaints and with consideration of their physical exam. If needed, translation services were available and utilized.   Patient with mild tachycardia.  No atrial fibrillation seen.  Narrow complex and regular rhythm.  He does endorse mild orthostatic lightheadedness, will provide 1 L NS bolus and obtain orthostatic vital signs.    He denies any recent illness or infection.  CBC is without anemia or leukocytosis otherwise concerning for infection.  BMP without electrolyte derangement.  Initial troponin of 4, given his intermittent symptoms of chest pressure over 4 to 6 weeks, do not need to trend.    Discussed case with Dr. Reather Converse who evaluated patient at bedside.  We will add on a TSH and D-dimer given his elevated heart rates with no other obvious explanation or cause.  Patient denies any history of thyroid disorder.  No thyromegaly on my exam.  Age-adjusted D-dimer is within normal limits.  Trended troponin x2, stable and unremarkable.  TSH also within normal limits.  He was negative with orthostatics.  Heart rate improved to 89 with IV fluid resuscitation.  Patient's comprehensive work-up obtained here in the ED was entirely reassuring.  Will provide ambulatory referral to cardiology given his intermittent episodes of palpitations without any obvious pattern.  He may benefit from Holter monitor or Zio patch placement.  He is  feeling well.  Personally evaluated by Dr. Reather Converse who agrees with plan.  ED return precautions discussed.  Patient voices understanding and is agreeable to the plan.  Final Clinical Impression(s) / ED Diagnoses Final diagnoses:  Tachycardia  Palpitations    Rx / DC Orders ED Discharge Orders         Ordered    Ambulatory referral to Cardiology        04/26/21 1616           Corena Herter, PA-C 04/26/21 1617    Corena Herter, PA-C 04/26/21 1619    Elnora Morrison, MD 04/27/21 0522

## 2021-04-26 NOTE — Discharge Instructions (Addendum)
Your work-up today was entirely reassuring.  Please follow-up with your primary care provider regarding today's ED encounter.  I have also placed an ambulatory referral to cardiology which means that they will call you to schedule an appointment.  If you do not hear from them in the next few days, please call the number provided first thing next week.  Given your palpitations and intermittent left-sided chest discomfort, you would benefit from close outpatient evaluation.  Return to the ER seek immediate medical attention should you experience any new or worsening symptoms.

## 2021-05-09 ENCOUNTER — Ambulatory Visit: Payer: No Typology Code available for payment source | Admitting: Cardiology

## 2022-02-03 DIAGNOSIS — G894 Chronic pain syndrome: Secondary | ICD-10-CM | POA: Diagnosis not present

## 2022-02-03 DIAGNOSIS — M5135 Other intervertebral disc degeneration, thoracolumbar region: Secondary | ICD-10-CM | POA: Diagnosis not present

## 2022-02-03 DIAGNOSIS — Z9889 Other specified postprocedural states: Secondary | ICD-10-CM | POA: Diagnosis not present

## 2022-02-03 DIAGNOSIS — M47816 Spondylosis without myelopathy or radiculopathy, lumbar region: Secondary | ICD-10-CM | POA: Diagnosis not present

## 2022-03-11 DIAGNOSIS — E039 Hypothyroidism, unspecified: Secondary | ICD-10-CM | POA: Diagnosis not present

## 2022-03-11 DIAGNOSIS — E1140 Type 2 diabetes mellitus with diabetic neuropathy, unspecified: Secondary | ICD-10-CM | POA: Diagnosis not present

## 2022-03-11 DIAGNOSIS — R000 Tachycardia, unspecified: Secondary | ICD-10-CM | POA: Diagnosis not present

## 2022-03-11 DIAGNOSIS — Z85038 Personal history of other malignant neoplasm of large intestine: Secondary | ICD-10-CM | POA: Diagnosis not present

## 2022-03-11 DIAGNOSIS — E1165 Type 2 diabetes mellitus with hyperglycemia: Secondary | ICD-10-CM | POA: Diagnosis not present

## 2022-03-11 DIAGNOSIS — K219 Gastro-esophageal reflux disease without esophagitis: Secondary | ICD-10-CM | POA: Diagnosis not present

## 2022-03-11 DIAGNOSIS — N400 Enlarged prostate without lower urinary tract symptoms: Secondary | ICD-10-CM | POA: Diagnosis not present

## 2022-03-11 DIAGNOSIS — F331 Major depressive disorder, recurrent, moderate: Secondary | ICD-10-CM | POA: Diagnosis not present

## 2022-03-11 DIAGNOSIS — E785 Hyperlipidemia, unspecified: Secondary | ICD-10-CM | POA: Diagnosis not present

## 2022-03-11 DIAGNOSIS — I10 Essential (primary) hypertension: Secondary | ICD-10-CM | POA: Diagnosis not present

## 2022-03-17 DIAGNOSIS — I10 Essential (primary) hypertension: Secondary | ICD-10-CM | POA: Diagnosis not present

## 2022-03-17 DIAGNOSIS — E039 Hypothyroidism, unspecified: Secondary | ICD-10-CM | POA: Diagnosis not present

## 2022-03-17 DIAGNOSIS — E1165 Type 2 diabetes mellitus with hyperglycemia: Secondary | ICD-10-CM | POA: Diagnosis not present

## 2022-03-19 DIAGNOSIS — R000 Tachycardia, unspecified: Secondary | ICD-10-CM | POA: Diagnosis not present

## 2022-03-19 DIAGNOSIS — E1140 Type 2 diabetes mellitus with diabetic neuropathy, unspecified: Secondary | ICD-10-CM | POA: Diagnosis not present

## 2022-03-19 DIAGNOSIS — E039 Hypothyroidism, unspecified: Secondary | ICD-10-CM | POA: Diagnosis not present

## 2022-03-19 DIAGNOSIS — F331 Major depressive disorder, recurrent, moderate: Secondary | ICD-10-CM | POA: Diagnosis not present

## 2022-03-19 DIAGNOSIS — E1165 Type 2 diabetes mellitus with hyperglycemia: Secondary | ICD-10-CM | POA: Diagnosis not present

## 2022-03-19 DIAGNOSIS — K219 Gastro-esophageal reflux disease without esophagitis: Secondary | ICD-10-CM | POA: Diagnosis not present

## 2022-03-19 DIAGNOSIS — I10 Essential (primary) hypertension: Secondary | ICD-10-CM | POA: Diagnosis not present

## 2022-03-19 DIAGNOSIS — Z85038 Personal history of other malignant neoplasm of large intestine: Secondary | ICD-10-CM | POA: Diagnosis not present

## 2022-03-19 DIAGNOSIS — N400 Enlarged prostate without lower urinary tract symptoms: Secondary | ICD-10-CM | POA: Diagnosis not present

## 2022-03-19 DIAGNOSIS — E785 Hyperlipidemia, unspecified: Secondary | ICD-10-CM | POA: Diagnosis not present

## 2022-04-29 DIAGNOSIS — D123 Benign neoplasm of transverse colon: Secondary | ICD-10-CM | POA: Diagnosis not present

## 2022-04-29 DIAGNOSIS — K5730 Diverticulosis of large intestine without perforation or abscess without bleeding: Secondary | ICD-10-CM | POA: Diagnosis not present

## 2022-04-29 DIAGNOSIS — Z85038 Personal history of other malignant neoplasm of large intestine: Secondary | ICD-10-CM | POA: Diagnosis not present

## 2022-04-29 DIAGNOSIS — K648 Other hemorrhoids: Secondary | ICD-10-CM | POA: Diagnosis not present

## 2022-04-29 DIAGNOSIS — Z09 Encounter for follow-up examination after completed treatment for conditions other than malignant neoplasm: Secondary | ICD-10-CM | POA: Diagnosis not present

## 2022-04-29 DIAGNOSIS — Z980 Intestinal bypass and anastomosis status: Secondary | ICD-10-CM | POA: Diagnosis not present

## 2022-04-29 DIAGNOSIS — Z86010 Personal history of colonic polyps: Secondary | ICD-10-CM | POA: Diagnosis not present

## 2022-05-01 DIAGNOSIS — D123 Benign neoplasm of transverse colon: Secondary | ICD-10-CM | POA: Diagnosis not present

## 2022-06-12 DIAGNOSIS — L570 Actinic keratosis: Secondary | ICD-10-CM | POA: Diagnosis not present

## 2022-06-12 DIAGNOSIS — L820 Inflamed seborrheic keratosis: Secondary | ICD-10-CM | POA: Diagnosis not present

## 2022-06-12 DIAGNOSIS — L538 Other specified erythematous conditions: Secondary | ICD-10-CM | POA: Diagnosis not present

## 2023-01-07 ENCOUNTER — Other Ambulatory Visit (HOSPITAL_COMMUNITY): Payer: Self-pay | Admitting: Internal Medicine

## 2023-01-07 DIAGNOSIS — M545 Low back pain, unspecified: Secondary | ICD-10-CM

## 2023-01-19 ENCOUNTER — Other Ambulatory Visit (HOSPITAL_COMMUNITY): Payer: Self-pay | Admitting: *Deleted

## 2023-01-19 ENCOUNTER — Emergency Department (HOSPITAL_COMMUNITY): Payer: Medicare Other

## 2023-01-19 ENCOUNTER — Other Ambulatory Visit: Payer: Self-pay

## 2023-01-19 ENCOUNTER — Inpatient Hospital Stay (HOSPITAL_COMMUNITY)
Admission: EM | Admit: 2023-01-19 | Discharge: 2023-01-28 | DRG: 233 | Disposition: A | Discharge status: Home / Self Care | Payer: Medicare Other | Attending: Surgery | Admitting: Surgery

## 2023-01-19 ENCOUNTER — Encounter (HOSPITAL_COMMUNITY): Payer: Self-pay

## 2023-01-19 ENCOUNTER — Inpatient Hospital Stay (HOSPITAL_COMMUNITY): Payer: Medicare Other

## 2023-01-19 ENCOUNTER — Emergency Department (HOSPITAL_COMMUNITY): Payer: No Typology Code available for payment source

## 2023-01-19 DIAGNOSIS — N400 Enlarged prostate without lower urinary tract symptoms: Secondary | ICD-10-CM | POA: Diagnosis present

## 2023-01-19 DIAGNOSIS — R0902 Hypoxemia: Secondary | ICD-10-CM | POA: Diagnosis not present

## 2023-01-19 DIAGNOSIS — Z86010 Personal history of colonic polyps: Secondary | ICD-10-CM

## 2023-01-19 DIAGNOSIS — F319 Bipolar disorder, unspecified: Secondary | ICD-10-CM | POA: Diagnosis not present

## 2023-01-19 DIAGNOSIS — I25110 Atherosclerotic heart disease of native coronary artery with unstable angina pectoris: Principal | ICD-10-CM | POA: Diagnosis present

## 2023-01-19 DIAGNOSIS — E118 Type 2 diabetes mellitus with unspecified complications: Secondary | ICD-10-CM | POA: Diagnosis not present

## 2023-01-19 DIAGNOSIS — J441 Chronic obstructive pulmonary disease with (acute) exacerbation: Secondary | ICD-10-CM | POA: Diagnosis not present

## 2023-01-19 DIAGNOSIS — E8770 Fluid overload, unspecified: Secondary | ICD-10-CM | POA: Diagnosis not present

## 2023-01-19 DIAGNOSIS — Y838 Other surgical procedures as the cause of abnormal reaction of the patient, or of later complication, without mention of misadventure at the time of the procedure: Secondary | ICD-10-CM | POA: Diagnosis not present

## 2023-01-19 DIAGNOSIS — Z85038 Personal history of other malignant neoplasm of large intestine: Secondary | ICD-10-CM

## 2023-01-19 DIAGNOSIS — J479 Bronchiectasis, uncomplicated: Secondary | ICD-10-CM | POA: Diagnosis present

## 2023-01-19 DIAGNOSIS — Z7985 Long-term (current) use of injectable non-insulin antidiabetic drugs: Secondary | ICD-10-CM

## 2023-01-19 DIAGNOSIS — J42 Unspecified chronic bronchitis: Secondary | ICD-10-CM | POA: Diagnosis not present

## 2023-01-19 DIAGNOSIS — Z9049 Acquired absence of other specified parts of digestive tract: Secondary | ICD-10-CM

## 2023-01-19 DIAGNOSIS — R079 Chest pain, unspecified: Secondary | ICD-10-CM | POA: Diagnosis not present

## 2023-01-19 DIAGNOSIS — E782 Mixed hyperlipidemia: Secondary | ICD-10-CM | POA: Diagnosis present

## 2023-01-19 DIAGNOSIS — Z79890 Hormone replacement therapy (postmenopausal): Secondary | ICD-10-CM

## 2023-01-19 DIAGNOSIS — I10 Essential (primary) hypertension: Secondary | ICD-10-CM

## 2023-01-19 DIAGNOSIS — E1142 Type 2 diabetes mellitus with diabetic polyneuropathy: Secondary | ICD-10-CM | POA: Diagnosis present

## 2023-01-19 DIAGNOSIS — Z951 Presence of aortocoronary bypass graft: Secondary | ICD-10-CM

## 2023-01-19 DIAGNOSIS — J9811 Atelectasis: Secondary | ICD-10-CM | POA: Diagnosis not present

## 2023-01-19 DIAGNOSIS — I517 Cardiomegaly: Secondary | ICD-10-CM | POA: Diagnosis not present

## 2023-01-19 DIAGNOSIS — Z9221 Personal history of antineoplastic chemotherapy: Secondary | ICD-10-CM

## 2023-01-19 DIAGNOSIS — Z8249 Family history of ischemic heart disease and other diseases of the circulatory system: Secondary | ICD-10-CM | POA: Diagnosis not present

## 2023-01-19 DIAGNOSIS — Z01810 Encounter for preprocedural cardiovascular examination: Secondary | ICD-10-CM | POA: Diagnosis not present

## 2023-01-19 DIAGNOSIS — Z91030 Bee allergy status: Secondary | ICD-10-CM

## 2023-01-19 DIAGNOSIS — I700 Atherosclerosis of aorta: Secondary | ICD-10-CM | POA: Diagnosis not present

## 2023-01-19 DIAGNOSIS — E8779 Other fluid overload: Secondary | ICD-10-CM | POA: Diagnosis not present

## 2023-01-19 DIAGNOSIS — E039 Hypothyroidism, unspecified: Secondary | ICD-10-CM | POA: Diagnosis present

## 2023-01-19 DIAGNOSIS — I9581 Postprocedural hypotension: Secondary | ICD-10-CM | POA: Diagnosis not present

## 2023-01-19 DIAGNOSIS — I2510 Atherosclerotic heart disease of native coronary artery without angina pectoris: Secondary | ICD-10-CM | POA: Diagnosis not present

## 2023-01-19 DIAGNOSIS — Z79899 Other long term (current) drug therapy: Secondary | ICD-10-CM | POA: Diagnosis not present

## 2023-01-19 DIAGNOSIS — J449 Chronic obstructive pulmonary disease, unspecified: Secondary | ICD-10-CM | POA: Diagnosis present

## 2023-01-19 DIAGNOSIS — F4310 Post-traumatic stress disorder, unspecified: Secondary | ICD-10-CM | POA: Diagnosis present

## 2023-01-19 DIAGNOSIS — J8410 Pulmonary fibrosis, unspecified: Secondary | ICD-10-CM | POA: Diagnosis not present

## 2023-01-19 DIAGNOSIS — Z888 Allergy status to other drugs, medicaments and biological substances status: Secondary | ICD-10-CM

## 2023-01-19 DIAGNOSIS — Z8711 Personal history of peptic ulcer disease: Secondary | ICD-10-CM

## 2023-01-19 DIAGNOSIS — D62 Acute posthemorrhagic anemia: Secondary | ICD-10-CM | POA: Diagnosis not present

## 2023-01-19 DIAGNOSIS — I719 Aortic aneurysm of unspecified site, without rupture: Secondary | ICD-10-CM

## 2023-01-19 DIAGNOSIS — R29818 Other symptoms and signs involving the nervous system: Secondary | ICD-10-CM | POA: Diagnosis not present

## 2023-01-19 DIAGNOSIS — I083 Combined rheumatic disorders of mitral, aortic and tricuspid valves: Secondary | ICD-10-CM | POA: Diagnosis not present

## 2023-01-19 DIAGNOSIS — I7143 Infrarenal abdominal aortic aneurysm, without rupture: Secondary | ICD-10-CM

## 2023-01-19 DIAGNOSIS — S32009A Unspecified fracture of unspecified lumbar vertebra, initial encounter for closed fracture: Secondary | ICD-10-CM | POA: Diagnosis not present

## 2023-01-19 DIAGNOSIS — J439 Emphysema, unspecified: Secondary | ICD-10-CM | POA: Diagnosis not present

## 2023-01-19 DIAGNOSIS — G319 Degenerative disease of nervous system, unspecified: Secondary | ICD-10-CM | POA: Diagnosis not present

## 2023-01-19 DIAGNOSIS — G8929 Other chronic pain: Secondary | ICD-10-CM | POA: Diagnosis present

## 2023-01-19 DIAGNOSIS — Z87891 Personal history of nicotine dependence: Secondary | ICD-10-CM | POA: Diagnosis not present

## 2023-01-19 DIAGNOSIS — I6529 Occlusion and stenosis of unspecified carotid artery: Secondary | ICD-10-CM | POA: Diagnosis not present

## 2023-01-19 DIAGNOSIS — I200 Unstable angina: Secondary | ICD-10-CM | POA: Diagnosis not present

## 2023-01-19 DIAGNOSIS — I6782 Cerebral ischemia: Secondary | ICD-10-CM | POA: Diagnosis not present

## 2023-01-19 DIAGNOSIS — G629 Polyneuropathy, unspecified: Secondary | ICD-10-CM | POA: Diagnosis not present

## 2023-01-19 DIAGNOSIS — K219 Gastro-esophageal reflux disease without esophagitis: Secondary | ICD-10-CM

## 2023-01-19 DIAGNOSIS — Z886 Allergy status to analgesic agent status: Secondary | ICD-10-CM

## 2023-01-19 DIAGNOSIS — J9601 Acute respiratory failure with hypoxia: Secondary | ICD-10-CM | POA: Diagnosis not present

## 2023-01-19 DIAGNOSIS — Z7984 Long term (current) use of oral hypoglycemic drugs: Secondary | ICD-10-CM

## 2023-01-19 DIAGNOSIS — R000 Tachycardia, unspecified: Secondary | ICD-10-CM | POA: Diagnosis not present

## 2023-01-19 DIAGNOSIS — M810 Age-related osteoporosis without current pathological fracture: Secondary | ICD-10-CM | POA: Diagnosis present

## 2023-01-19 DIAGNOSIS — E785 Hyperlipidemia, unspecified: Secondary | ICD-10-CM

## 2023-01-19 DIAGNOSIS — J95812 Postprocedural air leak: Secondary | ICD-10-CM | POA: Diagnosis not present

## 2023-01-19 DIAGNOSIS — R0789 Other chest pain: Secondary | ICD-10-CM | POA: Diagnosis not present

## 2023-01-19 DIAGNOSIS — I6381 Other cerebral infarction due to occlusion or stenosis of small artery: Secondary | ICD-10-CM | POA: Diagnosis not present

## 2023-01-19 DIAGNOSIS — I720 Aneurysm of carotid artery: Secondary | ICD-10-CM | POA: Diagnosis not present

## 2023-01-19 DIAGNOSIS — Z9889 Other specified postprocedural states: Secondary | ICD-10-CM | POA: Diagnosis not present

## 2023-01-19 DIAGNOSIS — N281 Cyst of kidney, acquired: Secondary | ICD-10-CM | POA: Diagnosis not present

## 2023-01-19 DIAGNOSIS — R109 Unspecified abdominal pain: Secondary | ICD-10-CM | POA: Diagnosis not present

## 2023-01-19 DIAGNOSIS — E119 Type 2 diabetes mellitus without complications: Secondary | ICD-10-CM

## 2023-01-19 DIAGNOSIS — J9589 Other postprocedural complications and disorders of respiratory system, not elsewhere classified: Secondary | ICD-10-CM | POA: Diagnosis not present

## 2023-01-19 DIAGNOSIS — K5730 Diverticulosis of large intestine without perforation or abscess without bleeding: Secondary | ICD-10-CM | POA: Diagnosis not present

## 2023-01-19 DIAGNOSIS — I6612 Occlusion and stenosis of left anterior cerebral artery: Secondary | ICD-10-CM | POA: Diagnosis not present

## 2023-01-19 DIAGNOSIS — E1165 Type 2 diabetes mellitus with hyperglycemia: Secondary | ICD-10-CM | POA: Diagnosis present

## 2023-01-19 DIAGNOSIS — I959 Hypotension, unspecified: Secondary | ICD-10-CM | POA: Diagnosis not present

## 2023-01-19 DIAGNOSIS — H538 Other visual disturbances: Secondary | ICD-10-CM | POA: Diagnosis not present

## 2023-01-19 DIAGNOSIS — J939 Pneumothorax, unspecified: Secondary | ICD-10-CM | POA: Diagnosis not present

## 2023-01-19 DIAGNOSIS — J432 Centrilobular emphysema: Secondary | ICD-10-CM | POA: Diagnosis not present

## 2023-01-19 DIAGNOSIS — M549 Dorsalgia, unspecified: Secondary | ICD-10-CM | POA: Diagnosis present

## 2023-01-19 DIAGNOSIS — K8020 Calculus of gallbladder without cholecystitis without obstruction: Secondary | ICD-10-CM | POA: Diagnosis not present

## 2023-01-19 DIAGNOSIS — M1990 Unspecified osteoarthritis, unspecified site: Secondary | ICD-10-CM | POA: Diagnosis present

## 2023-01-19 DIAGNOSIS — I6523 Occlusion and stenosis of bilateral carotid arteries: Secondary | ICD-10-CM | POA: Diagnosis not present

## 2023-01-19 DIAGNOSIS — T380X5A Adverse effect of glucocorticoids and synthetic analogues, initial encounter: Secondary | ICD-10-CM | POA: Diagnosis not present

## 2023-01-19 DIAGNOSIS — F411 Generalized anxiety disorder: Secondary | ICD-10-CM | POA: Diagnosis present

## 2023-01-19 LAB — CBC WITH DIFFERENTIAL/PLATELET
Abs Immature Granulocytes: 0.06 10*3/uL (ref 0.00–0.07)
Basophils Absolute: 0.1 10*3/uL (ref 0.0–0.1)
Basophils Relative: 0 %
Eosinophils Absolute: 0.1 10*3/uL (ref 0.0–0.5)
Eosinophils Relative: 0 %
HCT: 42.7 % (ref 39.0–52.0)
Hemoglobin: 14.3 g/dL (ref 13.0–17.0)
Immature Granulocytes: 0 %
Lymphocytes Relative: 12 %
Lymphs Abs: 1.6 10*3/uL (ref 0.7–4.0)
MCH: 32.8 pg (ref 26.0–34.0)
MCHC: 33.5 g/dL (ref 30.0–36.0)
MCV: 97.9 fL (ref 80.0–100.0)
Monocytes Absolute: 1.1 10*3/uL — ABNORMAL HIGH (ref 0.1–1.0)
Monocytes Relative: 8 %
Neutro Abs: 11.2 10*3/uL — ABNORMAL HIGH (ref 1.7–7.7)
Neutrophils Relative %: 80 %
Platelets: 174 10*3/uL (ref 150–400)
RBC: 4.36 MIL/uL (ref 4.22–5.81)
RDW: 13.7 % (ref 11.5–15.5)
WBC: 14.2 10*3/uL — ABNORMAL HIGH (ref 4.0–10.5)
nRBC: 0 % (ref 0.0–0.2)

## 2023-01-19 LAB — COMPREHENSIVE METABOLIC PANEL
ALT: 103 U/L — ABNORMAL HIGH (ref 0–44)
AST: 75 U/L — ABNORMAL HIGH (ref 15–41)
Albumin: 4.2 g/dL (ref 3.5–5.0)
Alkaline Phosphatase: 94 U/L (ref 38–126)
Anion gap: 13 (ref 5–15)
BUN: 15 mg/dL (ref 8–23)
CO2: 22 mmol/L (ref 22–32)
Calcium: 9.2 mg/dL (ref 8.9–10.3)
Chloride: 100 mmol/L (ref 98–111)
Creatinine, Ser: 0.95 mg/dL (ref 0.61–1.24)
GFR, Estimated: >60 mL/min (ref >60)
Glucose, Bld: 267 mg/dL — ABNORMAL HIGH (ref 70–99)
Potassium: 4.2 mmol/L (ref 3.5–5.1)
Sodium: 135 mmol/L (ref 135–145)
Total Bilirubin: 1.1 mg/dL (ref 0.3–1.2)
Total Protein: 7.6 g/dL (ref 6.5–8.1)

## 2023-01-19 LAB — LIPASE, BLOOD: Lipase: 30 U/L (ref 11–51)

## 2023-01-19 LAB — GLUCOSE, CAPILLARY
Glucose-Capillary: 336 mg/dL — ABNORMAL HIGH (ref 70–99)
Glucose-Capillary: 369 mg/dL — ABNORMAL HIGH (ref 70–99)
Glucose-Capillary: 311 mg/dL — ABNORMAL HIGH (ref 70–99)
Glucose-Capillary: 254 mg/dL — ABNORMAL HIGH (ref 70–99)
Glucose-Capillary: 198 mg/dL — ABNORMAL HIGH (ref 70–99)

## 2023-01-19 LAB — ECHOCARDIOGRAM COMPLETE
AR max vel: 2.09 cm2
AV Area VTI: 2.47 cm2
AV Area mean vel: 2.3 cm2
AV Mean grad: 3.7 mmHg
AV Peak grad: 7.3 mmHg
Ao pk vel: 1.35 m/s
Area-P 1/2: 3.27 cm2
Height: 69 in
S' Lateral: 2.6 cm
Weight: 2926.4 oz

## 2023-01-19 LAB — CBC
HCT: 38.6 % — ABNORMAL LOW (ref 39.0–52.0)
Hemoglobin: 12.8 g/dL — ABNORMAL LOW (ref 13.0–17.0)
MCH: 32.8 pg (ref 26.0–34.0)
MCHC: 33.2 g/dL (ref 30.0–36.0)
MCV: 99 fL (ref 80.0–100.0)
Platelets: 149 10*3/uL — ABNORMAL LOW (ref 150–400)
RBC: 3.9 MIL/uL — ABNORMAL LOW (ref 4.22–5.81)
RDW: 13.9 % (ref 11.5–15.5)
WBC: 15.7 10*3/uL — ABNORMAL HIGH (ref 4.0–10.5)
nRBC: 0 % (ref 0.0–0.2)

## 2023-01-19 LAB — TROPONIN I (HIGH SENSITIVITY)
Troponin I (High Sensitivity): 5 ng/L (ref <18)
Troponin I (High Sensitivity): 5 ng/L (ref <18)
Troponin I (High Sensitivity): 7 ng/L (ref <18)
Troponin I (High Sensitivity): 8 ng/L (ref <18)

## 2023-01-19 LAB — CREATININE, SERUM
Creatinine, Ser: 0.92 mg/dL (ref 0.61–1.24)
GFR, Estimated: >60 mL/min (ref >60)

## 2023-01-19 LAB — MAGNESIUM: Magnesium: 1.9 mg/dL (ref 1.7–2.4)

## 2023-01-19 LAB — HEPATITIS PANEL, ACUTE
HCV Ab: NONREACTIVE
Hep A IgM: NONREACTIVE
Hep B C IgM: NONREACTIVE
Hepatitis B Surface Ag: NONREACTIVE

## 2023-01-19 LAB — ACETAMINOPHEN LEVEL: Acetaminophen (Tylenol), Serum: <10 ug/mL — ABNORMAL LOW (ref 10–30)

## 2023-01-19 LAB — HEPARIN LEVEL (UNFRACTIONATED)
Heparin Unfractionated: 0.59 IU/mL (ref 0.30–0.70)
Heparin Unfractionated: 0.57 IU/mL (ref 0.30–0.70)

## 2023-01-19 LAB — CBG MONITORING, ED: Glucose-Capillary: 231 mg/dL — ABNORMAL HIGH (ref 70–99)

## 2023-01-19 LAB — TSH: TSH: 2.513 u[IU]/mL (ref 0.350–4.500)

## 2023-01-19 LAB — PHOSPHORUS: Phosphorus: 3.6 mg/dL (ref 2.5–4.6)

## 2023-01-19 LAB — HEMOGLOBIN A1C
Hgb A1c MFr Bld: 6.7 % — ABNORMAL HIGH (ref 4.8–5.6)
Mean Plasma Glucose: 145.59 mg/dL

## 2023-01-19 LAB — BRAIN NATRIURETIC PEPTIDE: B Natriuretic Peptide: 45 pg/mL (ref 0.0–100.0)

## 2023-01-19 MED ORDER — SODIUM CHLORIDE 0.9% FLUSH
3.0000 mL | Freq: Two times a day (BID) | INTRAVENOUS | Status: DC
  Administered 2023-01-20 – 2023-01-22 (×4): 3 mL via INTRAVENOUS

## 2023-01-19 MED ORDER — NITROGLYCERIN 0.4 MG SL SUBL
SUBLINGUAL_TABLET | SUBLINGUAL | Status: AC
  Filled 2023-01-19: qty 1

## 2023-01-19 MED ORDER — LACTATED RINGERS IV BOLUS
1000.0000 mL | Freq: Once | INTRAVENOUS | Status: AC
  Administered 2023-01-19: 1000 mL via INTRAVENOUS

## 2023-01-19 MED ORDER — METHYLPREDNISOLONE SODIUM SUCC 125 MG IJ SOLR
125.0000 mg | Freq: Once | INTRAMUSCULAR | Status: AC
  Administered 2023-01-19: 125 mg via INTRAVENOUS
  Filled 2023-01-19: qty 2

## 2023-01-19 MED ORDER — ONDANSETRON HCL 4 MG PO TABS: 4.0000 mg | ORAL_TABLET | Freq: Four times a day (QID) | ORAL | Status: DC | PRN

## 2023-01-19 MED ORDER — GABAPENTIN 300 MG PO CAPS
300.0000 mg | ORAL_CAPSULE | Freq: Three times a day (TID) | ORAL | Status: DC
  Administered 2023-01-19 – 2023-01-28 (×24): 300 mg via ORAL
  Filled 2023-01-19 (×24): qty 1

## 2023-01-19 MED ORDER — ONDANSETRON HCL 4 MG/2ML IJ SOLN: 4.0000 mg | Freq: Four times a day (QID) | INTRAMUSCULAR | Status: DC | PRN

## 2023-01-19 MED ORDER — ENOXAPARIN SODIUM 40 MG/0.4ML IJ SOSY
40.0000 mg | PREFILLED_SYRINGE | INTRAMUSCULAR | Status: DC
  Administered 2023-01-19: 40 mg via SUBCUTANEOUS
  Filled 2023-01-19: qty 0.4

## 2023-01-19 MED ORDER — LEVOTHYROXINE SODIUM 50 MCG PO TABS
50.0000 ug | ORAL_TABLET | Freq: Every day | ORAL | Status: DC
  Administered 2023-01-20 – 2023-01-28 (×9): 50 ug via ORAL
  Filled 2023-01-19 (×9): qty 1

## 2023-01-19 MED ORDER — SODIUM CHLORIDE 0.9 % IV SOLN: 250.0000 mL | INTRAVENOUS | Status: DC | PRN

## 2023-01-19 MED ORDER — PERFLUTREN LIPID MICROSPHERE
1.0000 mL | INTRAVENOUS | Status: AC | PRN
  Administered 2023-01-19: 4 mL via INTRAVENOUS

## 2023-01-19 MED ORDER — SODIUM CHLORIDE 0.9 % WEIGHT BASED INFUSION: 1.0000 mL/kg/h | INTRAVENOUS | Status: DC

## 2023-01-19 MED ORDER — TAMSULOSIN HCL 0.4 MG PO CAPS
0.4000 mg | ORAL_CAPSULE | Freq: Two times a day (BID) | ORAL | Status: DC
  Administered 2023-01-19 – 2023-01-26 (×13): 0.4 mg via ORAL
  Filled 2023-01-19 (×13): qty 1

## 2023-01-19 MED ORDER — HEPARIN (PORCINE) 25000 UT/250ML-% IV SOLN
1000.0000 [IU]/h | INTRAVENOUS | Status: DC
  Administered 2023-01-19: 1000 [IU]/h via INTRAVENOUS
  Filled 2023-01-19: qty 250

## 2023-01-19 MED ORDER — HYDROMORPHONE HCL 1 MG/ML IJ SOLN
0.5000 mg | INTRAMUSCULAR | Status: DC | PRN
  Administered 2023-01-19 – 2023-01-22 (×5): 0.5 mg via INTRAVENOUS
  Filled 2023-01-19 (×6): qty 0.5

## 2023-01-19 MED ORDER — PANTOPRAZOLE SODIUM 40 MG PO TBEC
40.0000 mg | DELAYED_RELEASE_TABLET | Freq: Every day | ORAL | Status: DC
  Administered 2023-01-20 – 2023-01-22 (×3): 40 mg via ORAL
  Filled 2023-01-19 (×3): qty 1

## 2023-01-19 MED ORDER — ALBUTEROL SULFATE (2.5 MG/3ML) 0.083% IN NEBU
2.5000 mg | INHALATION_SOLUTION | Freq: Once | RESPIRATORY_TRACT | Status: AC
  Administered 2023-01-19: 2.5 mg via RESPIRATORY_TRACT
  Filled 2023-01-19: qty 3

## 2023-01-19 MED ORDER — CITALOPRAM HYDROBROMIDE 20 MG PO TABS
40.0000 mg | ORAL_TABLET | Freq: Every day | ORAL | Status: DC
  Administered 2023-01-19 – 2023-01-27 (×9): 40 mg via ORAL
  Filled 2023-01-19 (×9): qty 2

## 2023-01-19 MED ORDER — ASPIRIN 81 MG PO CHEW
81.0000 mg | CHEWABLE_TABLET | ORAL | Status: AC
  Administered 2023-01-20: 81 mg via ORAL
  Filled 2023-01-19: qty 1

## 2023-01-19 MED ORDER — QUETIAPINE FUMARATE 200 MG PO TABS
400.0000 mg | ORAL_TABLET | Freq: Every day | ORAL | Status: DC
  Administered 2023-01-19 – 2023-01-22 (×4): 400 mg via ORAL
  Filled 2023-01-19 (×3): qty 2
  Filled 2023-01-19: qty 4

## 2023-01-19 MED ORDER — ACETAMINOPHEN 325 MG PO TABS
650.0000 mg | ORAL_TABLET | Freq: Four times a day (QID) | ORAL | Status: DC | PRN
  Administered 2023-01-20 – 2023-01-23 (×4): 650 mg via ORAL
  Filled 2023-01-19 (×3): qty 2

## 2023-01-19 MED ORDER — SODIUM CHLORIDE 0.9% FLUSH
3.0000 mL | INTRAVENOUS | Status: DC | PRN
  Administered 2023-01-19: 3 mL via INTRAVENOUS

## 2023-01-19 MED ORDER — ALUM & MAG HYDROXIDE-SIMETH 200-200-20 MG/5ML PO SUSP
30.0000 mL | Freq: Once | ORAL | Status: AC
  Administered 2023-01-19: 30 mL via ORAL
  Filled 2023-01-19: qty 30

## 2023-01-19 MED ORDER — ARFORMOTEROL TARTRATE 15 MCG/2ML IN NEBU
15.0000 ug | INHALATION_SOLUTION | Freq: Two times a day (BID) | RESPIRATORY_TRACT | Status: DC
  Administered 2023-01-19 – 2023-01-28 (×15): 15 ug via RESPIRATORY_TRACT
  Filled 2023-01-19 (×17): qty 2

## 2023-01-19 MED ORDER — VITAMIN B-12 1000 MCG PO TABS
1000.0000 ug | ORAL_TABLET | Freq: Every day | ORAL | Status: DC
  Administered 2023-01-19 – 2023-01-22 (×4): 1000 ug via ORAL
  Filled 2023-01-19 (×4): qty 1

## 2023-01-19 MED ORDER — NITROGLYCERIN 0.4 MG SL SUBL
0.4000 mg | SUBLINGUAL_TABLET | SUBLINGUAL | Status: DC | PRN
  Administered 2023-01-19: 0.4 mg via SUBLINGUAL
  Filled 2023-01-19: qty 1

## 2023-01-19 MED ORDER — FENTANYL CITRATE PF 50 MCG/ML IJ SOSY
50.0000 ug | PREFILLED_SYRINGE | Freq: Once | INTRAMUSCULAR | Status: AC
  Administered 2023-01-19: 50 ug via INTRAVENOUS
  Filled 2023-01-19: qty 1

## 2023-01-19 MED ORDER — NITROGLYCERIN 0.4 MG/HR TD PT24
0.4000 mg | MEDICATED_PATCH | Freq: Every day | TRANSDERMAL | Status: DC
  Administered 2023-01-19 – 2023-01-22 (×4): 0.4 mg via TRANSDERMAL
  Filled 2023-01-19: qty 1
  Filled 2023-01-19: qty 2
  Filled 2023-01-19 (×3): qty 1

## 2023-01-19 MED ORDER — BUDESONIDE 0.5 MG/2ML IN SUSP
0.5000 mg | Freq: Two times a day (BID) | RESPIRATORY_TRACT | Status: DC
  Administered 2023-01-19 – 2023-01-28 (×15): 0.5 mg via RESPIRATORY_TRACT
  Filled 2023-01-19 (×17): qty 2

## 2023-01-19 MED ORDER — HEPARIN BOLUS VIA INFUSION
4000.0000 [IU] | Freq: Once | INTRAVENOUS | Status: AC
  Administered 2023-01-19: 4000 [IU] via INTRAVENOUS
  Filled 2023-01-19: qty 4000

## 2023-01-19 MED ORDER — INSULIN GLARGINE-YFGN 100 UNIT/ML ~~LOC~~ SOLN
15.0000 [IU] | Freq: Every day | SUBCUTANEOUS | Status: DC
  Administered 2023-01-19 – 2023-01-20 (×2): 15 [IU] via SUBCUTANEOUS
  Filled 2023-01-19 (×4): qty 0.15

## 2023-01-19 MED ORDER — ASPIRIN 81 MG PO TBEC
81.0000 mg | DELAYED_RELEASE_TABLET | Freq: Every day | ORAL | Status: DC
  Administered 2023-01-19 – 2023-01-22 (×3): 81 mg via ORAL
  Filled 2023-01-19 (×5): qty 1

## 2023-01-19 MED ORDER — METHYLPREDNISOLONE SODIUM SUCC 40 MG IJ SOLR
40.0000 mg | Freq: Two times a day (BID) | INTRAMUSCULAR | Status: DC
  Administered 2023-01-19 – 2023-01-22 (×6): 40 mg via INTRAVENOUS
  Filled 2023-01-19 (×6): qty 1

## 2023-01-19 MED ORDER — DM-GUAIFENESIN ER 30-600 MG PO TB12
1.0000 | ORAL_TABLET | Freq: Two times a day (BID) | ORAL | Status: DC
  Administered 2023-01-19 – 2023-01-22 (×8): 1 via ORAL
  Filled 2023-01-19 (×8): qty 1

## 2023-01-19 MED ORDER — HYDROMORPHONE HCL 1 MG/ML IJ SOLN
1.0000 mg | Freq: Once | INTRAMUSCULAR | Status: AC
  Administered 2023-01-19: 1 mg via INTRAVENOUS
  Filled 2023-01-19: qty 1

## 2023-01-19 MED ORDER — NITROGLYCERIN 0.4 MG SL SUBL
0.4000 mg | SUBLINGUAL_TABLET | SUBLINGUAL | Status: DC | PRN
  Administered 2023-01-19 (×4): 0.4 mg via SUBLINGUAL
  Filled 2023-01-19: qty 1

## 2023-01-19 MED ORDER — LEVALBUTEROL HCL 0.63 MG/3ML IN NEBU: 0.6300 mg | INHALATION_SOLUTION | Freq: Three times a day (TID) | RESPIRATORY_TRACT | Status: DC | PRN

## 2023-01-19 MED ORDER — DOXYCYCLINE HYCLATE 100 MG PO TABS
100.0000 mg | ORAL_TABLET | Freq: Two times a day (BID) | ORAL | Status: AC
  Administered 2023-01-19 – 2023-01-23 (×9): 100 mg via ORAL
  Filled 2023-01-19 (×9): qty 1

## 2023-01-19 MED ORDER — PANTOPRAZOLE SODIUM 40 MG IV SOLR
40.0000 mg | Freq: Once | INTRAVENOUS | Status: AC
  Administered 2023-01-19: 40 mg via INTRAVENOUS
  Filled 2023-01-19: qty 10

## 2023-01-19 MED ORDER — SODIUM CHLORIDE 0.9% FLUSH: 3.0000 mL | INTRAVENOUS | Status: DC | PRN

## 2023-01-19 MED ORDER — ACETAMINOPHEN 650 MG RE SUPP: 650.0000 mg | Freq: Four times a day (QID) | RECTAL | Status: DC | PRN

## 2023-01-19 MED ORDER — INSULIN ASPART 100 UNIT/ML IJ SOLN
0.0000 [IU] | Freq: Every day | INTRAMUSCULAR | Status: DC
  Administered 2023-01-19 – 2023-01-21 (×2): 3 [IU] via SUBCUTANEOUS
  Administered 2023-01-22: 4 [IU] via SUBCUTANEOUS

## 2023-01-19 MED ORDER — LEVALBUTEROL HCL 1.25 MG/0.5ML IN NEBU
1.2500 mg | INHALATION_SOLUTION | Freq: Once | RESPIRATORY_TRACT | Status: AC
  Administered 2023-01-19: 1.25 mg via RESPIRATORY_TRACT
  Filled 2023-01-19: qty 0.5

## 2023-01-19 MED ORDER — PRAVASTATIN SODIUM 10 MG PO TABS
20.0000 mg | ORAL_TABLET | Freq: Every day | ORAL | Status: DC
  Administered 2023-01-19: 20 mg via ORAL
  Filled 2023-01-19: qty 2

## 2023-01-19 MED ORDER — SODIUM CHLORIDE 0.9 % WEIGHT BASED INFUSION
3.0000 mL/kg/h | INTRAVENOUS | Status: DC
  Administered 2023-01-20: 3 mL/kg/h via INTRAVENOUS

## 2023-01-19 MED ORDER — INSULIN ASPART 100 UNIT/ML IJ SOLN
0.0000 [IU] | Freq: Three times a day (TID) | INTRAMUSCULAR | Status: DC
  Administered 2023-01-19: 11 [IU] via SUBCUTANEOUS
  Administered 2023-01-19: 15 [IU] via SUBCUTANEOUS
  Administered 2023-01-20: 5 [IU] via SUBCUTANEOUS
  Administered 2023-01-20: 11 [IU] via SUBCUTANEOUS
  Administered 2023-01-20 – 2023-01-21 (×3): 5 [IU] via SUBCUTANEOUS
  Administered 2023-01-21: 15 [IU] via SUBCUTANEOUS
  Administered 2023-01-22: 8 [IU] via SUBCUTANEOUS
  Administered 2023-01-22: 15 [IU] via SUBCUTANEOUS
  Administered 2023-01-22: 11 [IU] via SUBCUTANEOUS
  Administered 2023-01-23: 8 [IU] via SUBCUTANEOUS

## 2023-01-19 MED ORDER — IPRATROPIUM-ALBUTEROL 0.5-2.5 (3) MG/3ML IN SOLN
3.0000 mL | Freq: Once | RESPIRATORY_TRACT | Status: AC
  Administered 2023-01-19: 3 mL via RESPIRATORY_TRACT
  Filled 2023-01-19: qty 3

## 2023-01-19 MED ORDER — IOHEXOL 350 MG/ML SOLN
100.0000 mL | Freq: Once | INTRAVENOUS | Status: AC | PRN
  Administered 2023-01-19: 100 mL via INTRAVENOUS

## 2023-01-19 MED ORDER — HYDROMORPHONE HCL 1 MG/ML IJ SOLN
0.5000 mg | Freq: Once | INTRAMUSCULAR | Status: AC
  Administered 2023-01-19: 0.5 mg via INTRAVENOUS
  Filled 2023-01-19: qty 0.5

## 2023-01-19 NOTE — ED Triage Notes (Signed)
Pt arrived from home w c/o chest pain that began this past Friday. Pain feels like a lot of pressure. Pain has been accompanied with sob and emesis. Pt is diaphoretic.

## 2023-01-19 NOTE — Progress Notes (Deleted)
[place] for Heparin Indication: chest pain/ACS  Allergies  Allergen Reactions   Bee Venom Anaphylaxis   Ibuprofen Shortness Of Breath and Swelling    Spoke with patient 01/19/23 he assures me no allergy to aspirin, only other NSAIDs   Naproxen Anaphylaxis and Swelling   Prednisone     Patient Measurements: Height: '5\' 9"'$  (175.3 cm) Weight: 81.6 kg (179 lb 14.3 oz) IBW/kg (Calculated) : 70.7 HEPARIN DW (KG): 81.6   Vital Signs: Temp: 99 F (37.2 C) (02/05 2109) Temp Source: Oral (02/05 2109) BP: 143/101 (02/05 2109) Pulse Rate: 107 (02/05 2109)  Labs: Recent Labs    01/19/23 0240 01/19/23 0440 01/19/23 1103 01/19/23 1508 01/19/23 1704  HGB 14.3  --  12.8*  --   --   HCT 42.7  --  38.6*  --   --   PLT 174  --  149*  --   --   CREATININE 0.95  --  0.92  --   --   TROPONINIHS 5 5  --  7 8     Estimated Creatinine Clearance: 70.4 mL/min (by C-G formula based on SCr of 0.92 mg/dL).   Assessment: 77 y.o. male with a hx of DM2, bipolar, COPD  who presented to ED with chest pain . Patient not on any oral anticoagulation. Pharmacy asked to start heparin.  Heparin level therapeutic (0.59) on infusion at 1000 units/hr. No bleeding noted.  Goal of Therapy:  Heparin level 0.3-0.7 units/ml Monitor platelets by anticoagulation protocol: Yes   Plan:  Continue heparin infusion at 1000 units/hr Will f/u a.m. heparin level to confirm therapeutic  Sherlon Handing, PharmD, BCPS Please see amion for complete clinical pharmacist phone list 01/19/2023,10:27 PM

## 2023-01-19 NOTE — H&P (Addendum)
History and Physical    Patient: Matthew [name] EGB:[id number] DOB: Jul 10, 1946 DOA: 01/19/2023 DOS: the patient was seen and examined on 01/19/2023 PCP: [facility]  Patient coming from: Home  Chief Complaint:  Chief Complaint  Patient presents with   Chest Pain   HPI: Matthew [name] is a 77 y.o. male with medical history significant of hypertension, hyperlipidemia, COPD with emphysema, gastroesophageal reflux disease, type 2 diabetes mellitus, hypothyroidism and and depression/PTSD; who presented to the hospital secondary to shortness of breath and chest pressure.  Symptoms have been present for the last 2-1/2 days and worsening.  Patient reports sudden onset mid chest discomfort while current warts do partially improve after resting; he initially thought was associated with reflux and muscular strain.  Pain returned and failed to get better and in fact continued to get worse.  Once pain worsen patient expressed component of difficulty breathing, speaking in full sentences and having diaphoresis.  Patient also expressed pain traveling to the right side of his chest.  Denies focal weakness, nausea, vomiting, dysuria, hematuria, melena, hematochezia, fever, chills or sick contacts.  Workup in the ED demonstrating negative troponin, diffuse structural changes for COPD without acute opacity and negative for pulmonary embolism.  EKG without frank ST segment abnormalities but demonstrating very low voltage.  Treatment for COPD initiated with some improvement; due to is still ongoing chest discomfort for nitroglycerin provided with excellent relief of his symptoms.  Patient has never been evaluated or stratified for cardiac problems.  TRH consulted to place patient in the hospital for further evaluation and management.  Of note, patient ambulated and dropping to mid 80s on room air; good saturation on 3 L appreciated.  Review of Systems: As mentioned in the history of present illness. All  other systems reviewed and are negative.  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--  s/p  right hemicolectomy 06-30-2014  and chemotherapy complete [id number]   Complication of anesthesia    "I GET REAL COLD AND CAN'T URINATE"---  urinary retention   COPD with emphysema (HCC)    Cough    Diverticulosis of colon    GERD (gastroesophageal reflux disease)    History of adenomatous polyp of colon    History of bleeding peptic ulcer    2000   History of diverticulitis of colon    History of panic attacks    Hyperlipidemia    Osteoporosis    PTSD (post-traumatic stress disorder)    [place]   Rectus diastasis    Wears dentures    upper   Past Surgical History:  Procedure Laterality Date   ANTERIOR CERVICAL DECOMP/DISCECTOMY FUSION  04-18-2003   C5 -- 6   APPENDECTOMY  yrs ago   BACK SURGERY  [phone]   lumbar lam and fusions   CIRCUMCISION  03-05-2005   COLONOSCOPY  [phone]   LAPAROSCOPIC PARTIAL COLECTOMY N/A 06/30/2014   Procedure: LAPAROSCOPIC right COLECTOMY;  Surgeon: [name], MD;  Location: [facility];  Service: General;  Laterality: N/A;   PORT-A-CATH REMOVAL Left 10/11/2015   Procedure: REMOVAL PORT-A-CATH;  Surgeon: [name], MD;  Location: [facility];  Service: General;  Laterality: Left;   PORTACATH PLACEMENT Left 07/31/2014   Procedure: INSERTION PORT-A-CATH;  Surgeon: Stark Klein, MD;  Location: [place];  Service: General;  Laterality: Left;   UPPER GI ENDOSCOPY  2015   Social History:  reports that he quit smoking about 18 years ago. His smoking use included cigarettes. He has a 30.00 pack-year smoking history. He has never used smokeless tobacco. He reports that he does not drink alcohol and does not use drugs.  Allergies  Allergen  Reactions   Bee Venom Anaphylaxis   Ibuprofen Shortness Of Breath and Swelling    Spoke with patient 01/19/23 he assures me no allergy to aspirin, only other NSAIDs   Naproxen Anaphylaxis and Swelling   Prednisone     Family History  Problem Relation Age of Onset   Heart disease Mother     Prior to Admission medications   Medication Sig Start Date End Date Taking? Authorizing Provider  citalopram (CELEXA) 40 MG tablet Take 40 mg by mouth at bedtime.    [provider]  cyclobenzaprine (FLEXERIL) 10 MG tablet Take 1 tablet (10 mg total) by mouth at bedtime. 09/10/20   Jearld Fenton, NP  DM-Phenylephrine-Acetaminophen 10-5-325 MG/15ML LIQD Take by mouth as needed.    [provider]  EPINEPHrine 0.3 mg/0.3 mL IJ SOAJ injection Inject 0.3 mg into the muscle daily as needed (allergic reaction).    [provider]  ergocalciferol (VITAMIN D2) 50000 UNITS capsule Take 50,000 Units by mouth 2 (two) times a week.    [provider]  gabapentin (NEURONTIN) 300 MG capsule Take 300 mg by mouth 3 (three) times daily.    [provider]  glipiZIDE (GLUCOTROL) 10 MG tablet Take 1 tablet by mouth 2 (two) times daily. 10/27/18   [provider]  JARDIANCE 25 MG TABS tablet Take 25 mg by mouth daily. 09/14/19   [provider]  levothyroxine (SYNTHROID) 50 MCG tablet Take 50 mcg by mouth daily before breakfast.    [provider]  lisinopril (PRINIVIL,ZESTRIL) 5 MG tablet  12/01/18   [provider]  metFORMIN (GLUCOPHAGE) 1000 MG tablet Take 1 tablet by mouth 2 (two) times daily. 11/23/18   [provider]  Multiple Vitamin (MULTIVITAMIN) tablet Take 1 tablet by mouth daily.    [provider]  Omega-3 Fatty Acids (FISH OIL) 1000 MG CAPS Take by mouth.    [provider]  omeprazole (PRILOSEC) 20 MG capsule Take 20 mg by mouth daily with breakfast.  04/04/14   [provider]  ondansetron  (ZOFRAN-ODT) 8 MG disintegrating tablet Take 1 tablet (8 mg total) by mouth every 8 (eight) hours as needed for nausea. 07/27/19   Elby Beck, FNP  oxyCODONE (OXY IR/ROXICODONE) 5 MG immediate release tablet Take by mouth. 08/31/20   [provider]  polyethylene glycol (MIRALAX / GLYCOLAX) packet Take 17 g by mouth daily as needed.     [provider]  pravastatin (PRAVACHOL) 40 MG tablet Take 20 mg by mouth at bedtime.  03/16/14   [provider]  QUEtiapine (SEROQUEL) 400 MG tablet Take 400 mg by mouth at bedtime.    [provider]  tamsulosin (FLOMAX) 0.4 MG CAPS capsule Take 0.4 mg by mouth 2 (two) times daily.    [provider]  vitamin B-12 (CYANOCOBALAMIN) 1000 MCG tablet Take 1,000 mcg by mouth daily.    [provider]    Physical Exam: Vitals:   01/19/23 1100 01/19/23 1112 01/19/23 1116 01/19/23 1120  BP:  126/80 128/78 108/75  Pulse:    (!) 107  Resp:      Temp:      TempSrc:      SpO2: 94%     Weight:      Height:       General exam: Alert, awake, oriented x 3; expressing mild discomfort on his chest after receiving nitroglycerin and Dilaudid.  No nausea, no vomiting.  3 L nasal cannula supplementation in place to maintain saturation above 90%.  (Patient did not use oxygen at home). Respiratory system: Positive expiratory wheezing and bilateral rhonchi appreciated; no using accessory muscles tachypnea with exertion appreciated. Cardiovascular system: Rate controlled, no rubs, no gallops, no JVD on exam. Gastrointestinal system: Abdomen is nondistended, soft and nontender. No organomegaly or masses felt. Normal bowel sounds heard. Central nervous system: Alert and oriented. No focal neurological deficits. Extremities: No cyanosis or clubbing. Skin: No petechiae. Psychiatry: Judgement and insight appear normal. Mood & affect appropriate.   Data Reviewed: CBC: WBCs 14.2, Hemoglobin 14.3 and platelet count 174  K Comprehensive metabolic panel: Sodium 174, potassium 4.2, chloride 100, bicarb 22, BUN 15, creatinine 0.95, AST 75 and ALT 103; GFR more than 60. Lipase 30 BNP 45 Troponin: 5>> 5  Assessment and Plan: 1-chest pain: -Atypical with typical features and significant risk factors.  The more patient took about his chest discomfort the more change and information provided but all of them are very concerning for unstable angina. -Troponin negative, Q waves abnormalities on EKG without acute ischemic changes. -2D echo demonstrated no wall motion abnormalities and preserved ejection fraction. -Case discussed with cardiology service who has recommended initiating heparin drip and transfer patient to [facility] for cardiac cath evaluation. -Nitroglycerin patch will be applied and if needed nitroglycerin drip will be initiated. -Receiving oxygen supplementation as part of treatment for COPD exacerbation and will continue the use of aspirin and statin. -TIMI score 3  2-COPD exacerbation -Having difficulty speaking in full sentences; hypoxic requiring 3 L nasal cannula supplementation and with positive expiratory wheezing and rhonchi's on auscultation. -Patient started on steroids, nebulizer bronchodilator management and as needed Xopenex. -Doxycycline provided for bronchitis/bronchiectasis component. -PPI on daily basis will be continued. -Prior to admission patient was only using as needed albuterol with now no maintenance treatment. -Outpatient follow-up with pulmonology for PFTs recommended.  3-type 2 diabetes -Holding oral hypoglycemic agents -Sliding scale insulin and Semglee has been initiated -Will update A1c. -Follow CBGs fluctuation.  4-hyperlipidemia -Lipid panel will be obtained -Continue statin.  5-history of gastroesophageal reflux disease -Continue PPI.  6-history of depression/PTSD -No suicidal ideation or hallucination -Continue home antidepressant  regimen.  7-history of BPH -No complaining of urinary retention -Continue treatment with Flomax  8-back pain and neuropathy -Patient reports prior back surgeries -Continue treatment with gabapentin.  9-hypothyroidism -Continue Synthroid -Update TSH  10-aortic aneurysm -2.9 cm infrarenal aortic aneurysm.  Following radiology recommendations patient will need ultrasound every 5 years for follow up.   Advance Care Planning:   Code Status: Full Code   Consults: Cardiology service  Family Communication: Wife at bedside  Severity of Illness: The appropriate patient status for this patient is OBSERVATION. Observation status is judged to be reasonable and necessary in order to provide the required intensity of service to ensure the patient's safety. The patient's presenting symptoms, physical exam findings, and initial radiographic and laboratory data in the context of their medical condition is felt to place them at decreased risk for further clinical deterioration. Furthermore, it is anticipated that the patient will be medically stable for discharge  from the hospital within 2 midnights of admission.   Author: Barton Dubois, MD 01/19/2023 4:05 PM  For on call review [email or web address].

## 2023-01-19 NOTE — ED Provider Notes (Signed)
Patient with can continued hypoxia and chest pain.  He was admitted to medicine for COPD exacerbation and chest pain   Milton Ferguson, MD 01/19/23 [phone]

## 2023-01-19 NOTE — Progress Notes (Signed)
ANTICOAGULATION CONSULT NOTE - Initial Consult  Pharmacy Consult for Heparin Indication: chest pain/ACS  Allergies  Allergen Reactions   Bee Venom Anaphylaxis   Ibuprofen Shortness Of Breath and Swelling   Naproxen Anaphylaxis and Swelling   Prednisone     Patient Measurements: Height: '5\' 9"'$  (175.3 cm) Weight: 83 kg (182 lb 14.4 oz) IBW/kg (Calculated) : 70.7 HEPARIN DW (KG): 83   Vital Signs: Temp: 97.5 F (36.4 C) (02/05 1041) Temp Source: Oral (02/05 1041) BP: 108/75 (02/05 1120) Pulse Rate: 107 (02/05 1120)  Labs: Recent Labs    01/19/23 0240 01/19/23 0440 01/19/23 1103  HGB 14.3  --  12.8*  HCT 42.7  --  38.6*  PLT 174  --  149*  CREATININE 0.95  --  0.92  TROPONINIHS 5 5  --     Estimated Creatinine Clearance: 70.4 mL/min (by C-G formula based on SCr of 0.92 mg/dL).   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--  s/p  right hemicolectomy 06-30-2014  and chemotherapy complete [id number]   Complication of anesthesia    "I GET REAL COLD AND CAN'T URINATE"---  urinary retention   COPD with emphysema (HCC)    Cough    Diverticulosis of colon    GERD (gastroesophageal reflux disease)    History of adenomatous polyp of colon    History of bleeding peptic ulcer    2000   History of diverticulitis of colon    History of panic attacks    Hyperlipidemia    Osteoporosis    PTSD (post-traumatic stress disorder)    [place]   Rectus diastasis    Wears dentures    upper    Medications:  Medications Prior to Admission  Medication Sig Dispense Refill Last Dose   citalopram (CELEXA) 40 MG tablet Take 40 mg by mouth at bedtime.      cyclobenzaprine (FLEXERIL) 10 MG tablet Take 1 tablet (10 mg total) by mouth at bedtime. 20 tablet 0     DM-Phenylephrine-Acetaminophen 10-5-325 MG/15ML LIQD Take by mouth as needed.      EPINEPHrine 0.3 mg/0.3 mL IJ SOAJ injection Inject 0.3 mg into the muscle daily as needed (allergic reaction).      ergocalciferol (VITAMIN D2) 50000 UNITS capsule Take 50,000 Units by mouth 2 (two) times a week.      gabapentin (NEURONTIN) 300 MG capsule Take 300 mg by mouth 3 (three) times daily.      glipiZIDE (GLUCOTROL) 10 MG tablet Take 1 tablet by mouth 2 (two) times daily.      JARDIANCE 25 MG TABS tablet Take 25 mg by mouth daily.      levothyroxine (SYNTHROID) 50 MCG tablet Take 50 mcg by mouth daily before breakfast.      lisinopril (PRINIVIL,ZESTRIL) 5 MG tablet       metFORMIN (GLUCOPHAGE) 1000 MG tablet Take 1 tablet by mouth 2 (two) times daily.      Multiple Vitamin (MULTIVITAMIN) tablet Take 1 tablet by mouth daily.      Omega-3 Fatty Acids (FISH OIL) 1000 MG CAPS Take by mouth.      omeprazole (PRILOSEC) 20 MG capsule Take 20 mg by mouth daily with breakfast.       ondansetron (ZOFRAN-ODT) 8 MG disintegrating tablet Take  1 tablet (8 mg total) by mouth every 8 (eight) hours as needed for nausea. 12 tablet 0    oxyCODONE (OXY IR/ROXICODONE) 5 MG immediate release tablet Take by mouth.      polyethylene glycol (MIRALAX / GLYCOLAX) packet Take 17 g by mouth daily as needed.       pravastatin (PRAVACHOL) 40 MG tablet Take 20 mg by mouth at bedtime.       QUEtiapine (SEROQUEL) 400 MG tablet Take 400 mg by mouth at bedtime.      tamsulosin (FLOMAX) 0.4 MG CAPS capsule Take 0.4 mg by mouth 2 (two) times daily.      vitamin B-12 (CYANOCOBALAMIN) 1000 MCG tablet Take 1,000 mcg by mouth daily.       Assessment: 77 y.o. male with a hx of DM2, bipolar, COPD  who presented to ED with chest pain . Patient not on any oral anticoagulation. Pharmacy asked to start heparin   Goal of Therapy:  Heparin level 0.3-0.7 units/ml Monitor platelets by anticoagulation protocol: Yes   Plan:  Give 4000 units bolus x  1 Start heparin infusion at 1000 units/hr Check anti-Xa level in ~8 hours and daily while on heparin Continue to monitor H&H and platelets  Isac Sarna, BS Pharm D, BCPS Clinical Pharmacist 01/19/2023,2:14 PM

## 2023-01-19 NOTE — Progress Notes (Signed)
Patient arrived to the floor this nurse went into patients room with patients nurse, during assessment this nurse asked patient if he was having pain, patient stated yes chest pain rated 8/10 non radiating , patient states it feels like someone is sitting on my chest. Vital signs obtained and stable, 12 lead EKG obtained, 12 lead resulted critical stemi, MD (Dr. Dyann Kief) notified via [place]. No new orders at this time. Lab in patients room drawing blood.

## 2023-01-19 NOTE — ED Provider Notes (Signed)
[facility] Provider Note   CSN: [id number] Arrival date & time: 01/19/23  0229     History  Chief Complaint  Patient presents with   Chest Pain    Matthew [name] is a 77 y.o. male.  77 year old male presents ER today secondary to chest pain shortness of breath.  Patient states has been present for couple days progressively worsening.  Has had a couple episodes of emesis.  No history of heart disease.  No lower extremity swelling.  No trauma.  No cough.  No fevers.  States it seems to be in the center of his chest and upper abdomen.   Chest Pain      Home Medications Prior to Admission medications   Medication Sig Start Date End Date Taking? Authorizing Provider  citalopram (CELEXA) 40 MG tablet Take 40 mg by mouth at bedtime.    [provider]  cyclobenzaprine (FLEXERIL) 10 MG tablet Take 1 tablet (10 mg total) by mouth at bedtime. 09/10/20   Jearld Fenton, NP  DM-Phenylephrine-Acetaminophen 10-5-325 MG/15ML LIQD Take by mouth as needed.    [provider]  EPINEPHrine 0.3 mg/0.3 mL IJ SOAJ injection Inject 0.3 mg into the muscle daily as needed (allergic reaction).    [provider]  ergocalciferol (VITAMIN D2) 50000 UNITS capsule Take 50,000 Units by mouth 2 (two) times a week.    [provider]  gabapentin (NEURONTIN) 300 MG capsule Take 300 mg by mouth 3 (three) times daily.    [provider]  glipiZIDE (GLUCOTROL) 10 MG tablet Take 1 tablet by mouth 2 (two) times daily. 10/27/18   [provider]  JARDIANCE 25 MG TABS tablet Take 25 mg by mouth daily. 09/14/19   [provider]  levothyroxine (SYNTHROID) 50 MCG tablet Take 50 mcg by mouth daily before breakfast.    [provider]  lisinopril (PRINIVIL,ZESTRIL) 5 MG tablet  12/01/18   [provider]  metFORMIN (GLUCOPHAGE) 1000 MG tablet Take 1 tablet by mouth 2 (two) times daily. 11/23/18    [provider]  Multiple Vitamin (MULTIVITAMIN) tablet Take 1 tablet by mouth daily.    [provider]  Omega-3 Fatty Acids (FISH OIL) 1000 MG CAPS Take by mouth.    [provider]  omeprazole (PRILOSEC) 20 MG capsule Take 20 mg by mouth daily with breakfast.  04/04/14   [provider]  ondansetron (ZOFRAN-ODT) 8 MG disintegrating tablet Take 1 tablet (8 mg total) by mouth every 8 (eight) hours as needed for nausea. 07/27/19   Elby Beck, FNP  oxyCODONE (OXY IR/ROXICODONE) 5 MG immediate release tablet Take by mouth. 08/31/20   [provider]  polyethylene glycol (MIRALAX / GLYCOLAX) packet Take 17 g by mouth daily as needed.     [provider]  pravastatin (PRAVACHOL) 40 MG tablet Take 20 mg by mouth at bedtime.  03/16/14   [provider]  QUEtiapine (SEROQUEL) 400 MG tablet Take 400 mg by mouth at bedtime.    [provider]  tamsulosin (FLOMAX) 0.4 MG CAPS capsule Take 0.4 mg by mouth 2 (two) times daily.    [provider]  vitamin B-12 (CYANOCOBALAMIN) 1000 MCG tablet Take 1,000 mcg by mouth daily.    [provider]      Allergies    Bee venom and Ibuprofen    Review of Systems   Review of Systems  Cardiovascular:  Positive for chest pain.  Physical Exam Updated Vital Signs BP 126/81   Pulse (!) 108   Temp 98.7 F (37.1 C) (Oral)   Resp 19   SpO2 95%  Physical Exam Vitals and nursing note reviewed.  Constitutional:      Appearance: He is well-developed.  HENT:     Head: Normocephalic and atraumatic.  Cardiovascular:     Rate and Rhythm: Normal rate.  Pulmonary:     Effort: Pulmonary effort is normal. No respiratory distress.  Abdominal:     General: There is no distension.     Palpations: Abdomen is soft.     Tenderness: There is abdominal tenderness (Epigastric and right upper quadrant).  Musculoskeletal:        General: Normal range of motion.     Cervical back:  Normal range of motion.  Skin:    General: Skin is warm and dry.  Neurological:     Mental Status: He is alert.     ED Results / Procedures / Treatments   Labs (all labs ordered are listed, but only abnormal results are displayed) Labs Reviewed  CBC WITH DIFFERENTIAL/PLATELET - Abnormal; Notable for the following components:      Result Value   WBC 14.2 (*)    Neutro Abs 11.2 (*)    Monocytes Absolute 1.1 (*)    All other components within normal limits  COMPREHENSIVE METABOLIC PANEL - Abnormal; Notable for the following components:   Glucose, Bld 267 (*)    AST 75 (*)    ALT 103 (*)    All other components within normal limits  LIPASE, BLOOD  BRAIN NATRIURETIC PEPTIDE  TROPONIN I (HIGH SENSITIVITY)  TROPONIN I (HIGH SENSITIVITY)    EKG EKG Interpretation  Date/Time:  Monday January 19 2023 02:38:39 EST Ventricular Rate:  115 PR Interval:  159 QRS Duration: 101 QT Interval:  346 QTC Calculation: 479 R Axis:   -53 Text Interpretation: Sinus tachycardia LAD, consider left anterior fascicular block Low voltage, precordial leads Consider anterior infarct ST elevation, consider inferior injury Confirmed by Merrily Pew [phone]) on 01/19/2023 3:06:03 AM  Radiology CT Angio Chest/Abd/Pel for Dissection W and/or Wo Contrast  Result Date: 01/19/2023 CLINICAL DATA:  Chest pain and pressure EXAM: CT ANGIOGRAPHY CHEST, ABDOMEN AND PELVIS TECHNIQUE: Non-contrast CT of the chest was initially obtained. Multidetector CT imaging through the chest, abdomen and pelvis was performed using the standard protocol during bolus administration of intravenous contrast. Multiplanar reconstructed images and MIPs were obtained and reviewed to evaluate the vascular anatomy. RADIATION DOSE REDUCTION: This exam was performed according to the departmental dose-optimization program which includes automated exposure control, adjustment of the mA and/or kV according to patient size and/or use of iterative  reconstruction technique. CONTRAST:  139m OMNIPAQUE IOHEXOL 350 MG/ML SOLN COMPARISON:  08/26/2016 abdominal CT FINDINGS: CTA CHEST FINDINGS Cardiovascular: Preferential opacification of the thoracic aorta. No evidence of thoracic aortic aneurysm or dissection. Normal heart size. Trace pericardial effusion or thickening. Extensive atheromatous calcification of the aorta and coronaries. Mediastinum/Nodes: No hematoma or adenopathy Lungs/Pleura: Centrilobular emphysema. There is also subpleural lower lobe fibrotic changes with honeycombing. Musculoskeletal: Diffuse bridging thoracic osteophytes. Review of the MIP images confirms the above findings. CTA ABDOMEN AND PELVIS FINDINGS VASCULAR Aorta: Extensive atheromatous calcification of the aorta. Infrarenal fusiform aneurysm measuring 2.9 cm, 1.5 times the proximal normal segment. Celiac: Atheromatous plaque at the ostium. No branch vessel beading occlusion, or aneurysm. SMA: Replaced hepatic artery to the SMA. Atheromatous plaque proximally. No acute finding or beading. Renals:  Atheromatous plaque at the proximal renal arteries without flow reducing stenosis. Negative for aneurysm or dissection. IMA: Patent Inflow: Scattered atheromatous plaque.  No dissection or aneurysm Veins: Unremarkable in the arterial phase Review of the MIP images confirms the above findings. NON-VASCULAR Lower chest:  No contributory findings. Hepatobiliary: No focal liver abnormality.Cholelithiasis. No evidence of biliary inflammation Pancreas: Fat infiltration.  No acute finding Spleen: Unremarkable. Adrenals/Urinary Tract: Negative adrenals. No hydronephrosis or stone. Bilateral renal cysts, larger at the left interpolar kidney measuring 6.3 cm. Both appear simple with no follow-up imaging recommended. Unremarkable bladder. Stomach/Bowel: Proximal colectomy. No visible bowel inflammation. Sigmoid diverticulosis. Large third portion duodenum diverticulum but uncomplicated appearing  Lymphatic: No mass or adenopathy. Reproductive:No acute finding Other: No ascites or pneumoperitoneum. Musculoskeletal: Remote L1 and L3 endplate fractures. Generalized degeneration and osteopenia. Review of the MIP images confirms the above findings. IMPRESSION: 1. No evidence of acute aortic syndrome. 2. Trace pericardial effusion or thickening since 2017. 3. 2.9 cm infrarenal aortic aneurysm. Recommend follow-up ultrasound every 5 years. This recommendation follows ACR consensus guidelines: White Paper of the ACR Incidental Findings Committee II on Vascular Findings. J Am Coll Radiol 2013; 37:106-269. 4. Aortic Atherosclerosis (ICD10-I70.0) and Emphysema (ICD10-J43.9). Coronary atherosclerosis. 5. Colonic diverticulosis, cholelithiasis, and multilevel spinal ankylosis. Electronically Signed   By: Jorje Guild M.D.   On: 01/19/2023 04:14   DG Chest Portable 1 View  Result Date: 01/19/2023 CLINICAL DATA:  Evaluate for pneumothorax EXAM: PORTABLE CHEST 1 VIEW COMPARISON:  04/26/2021 FINDINGS: Heart and mediastinal contours are within normal limits. No focal opacities or effusions. No acute bony abnormality. Aortic atherosclerosis. No pneumothorax. IMPRESSION: No active cardiopulmonary disease. Electronically Signed   By: Rolm Baptise M.D.   On: 01/19/2023 03:16    Procedures Procedures    Medications Ordered in ED Medications  nitroGLYCERIN (NITROSTAT) SL tablet 0.4 mg (0.4 mg Sublingual Given 01/19/23 0303)  fentaNYL (SUBLIMAZE) injection 50 mcg (50 mcg Intravenous Given 01/19/23 0302)  iohexol (OMNIPAQUE) 350 MG/ML injection 100 mL (100 mLs Intravenous Contrast Given 01/19/23 0351)  alum & mag hydroxide-simeth (MAALOX/MYLANTA) 200-200-20 MG/5ML suspension 30 mL (30 mLs Oral Given 01/19/23 0447)  pantoprazole (PROTONIX) injection 40 mg (40 mg Intravenous Given 01/19/23 0449)  levalbuterol (XOPENEX) nebulizer solution 1.25 mg (1.25 mg Nebulization Given 01/19/23 0540)  lactated ringers bolus 1,000 mL (0 mLs  Intravenous Stopped 01/19/23 0631)  HYDROmorphone (DILAUDID) injection 1 mg (1 mg Intravenous Given 01/19/23 0449)    ED Course/ Medical Decision Making/ A&P                             Medical Decision Making Amount and/or Complexity of Data Reviewed Labs: ordered. Radiology: ordered.  Risk OTC drugs. Prescription drug management. Decision regarding hospitalization.   Patient CTA is negative for any PE, pneumonia.  His troponins are negative making ACS unlikely.  His EKG reads as an acute infarct however I do not see any ST elevation, Q waves or ST depression to suggest the same I suspect this is a error.  He is slightly tachycardic so fluids were given especially with the recent emesis.  Will await ultrasound as I do see a gallstone when I interpret his CT scan and may be some mild biliary dilatation along with elevated liver labs and does not drink alcohol.  Will also add on hepatitis and Tylenol level.  Care transferred pending [place] and reevaluation for disposition.   Final Clinical Impression(s) / ED Diagnoses  Final diagnoses:  None    Rx / DC Orders ED Discharge Orders     None         Despina Boan, Corene Cornea, MD 01/19/23 2318

## 2023-01-19 NOTE — Consult Note (Addendum)
Cardiology Consultation   Patient ID: [name] MRN: [id number]; DOB: 1946/11/05  Admit date: 01/19/2023 Date of Consult: 01/19/2023  PCP:  [facility] Cardiologist: New     Patient Profile:   Matthew [name] is a 77 y.o. male with a hx of DM2, bipolar, COPD  who is being seen 01/19/2023 for the evaluation of chest pain at the request of Dr Dyann Kief.  History of Present Illness:   Matthew [name] 77 yo male history of DM2, bipolar, colon cancer s/p hemicolectomy and chemo 2016, COPD, presents with chest pain and hypoxia.  Reports chest pain started Friday around 1pm while cutting wood with chain saw and hauling it. 10/10 sharp pain left chest, felt hot sweaty, nauseous. Sat down and rested pain improved over 30-40 minutes but did not resolve completely. On and off pain throughout Saturday and Sunday. Increasing SOB, some nausea/vomiting over the weeekend. Chest pain progressed Sunday and came to ER. Since arrival chest pain improved with SL NG. Pain can be worst with movement, deep breathing.     WBC 14.2 Plt 174 Hgb 14.3 K 4.2 Cr 0.95 BUN 15 BNP 45 Trop 5-->5--> EKG: SR, inferior Qwaves CXR no acute process Echo: LVEF 65-70%, no WMAs, trivial circumferential pericardial effusion  CTA C/A/P no acute aortic pathology. 2.9 cm infrarenal aneurysm needs [place] 5 years. +coronary atherosclerosis RUQ [place]: No cholelithiasis or sonographic evidence of acute cholecystitis.Heterogeneous hepatic echogenicity with overall increased echogenicity concerning for hepatocellular disease.    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--  s/p  right hemicolectomy 06-30-2014  and chemotherapy complete [id number]   Complication of anesthesia    "I GET REAL COLD AND CAN'T  URINATE"---  urinary retention   COPD with emphysema (HCC)    Cough    Diverticulosis of colon    GERD (gastroesophageal reflux disease)    History of adenomatous polyp of colon    History of bleeding peptic ulcer    2000   History of diverticulitis of colon    History of panic attacks    Hyperlipidemia    Osteoporosis    PTSD (post-traumatic stress disorder)    [place]   Rectus diastasis    Wears dentures    upper    Past Surgical History:  Procedure Laterality Date   ANTERIOR CERVICAL DECOMP/DISCECTOMY FUSION  04-18-2003   C5 -- 6   APPENDECTOMY  yrs ago   BACK SURGERY  [phone]   lumbar lam and fusions   CIRCUMCISION  03-05-2005   COLONOSCOPY  [id number]   LAPAROSCOPIC PARTIAL COLECTOMY N/A 06/30/2014   Procedure: LAPAROSCOPIC right COLECTOMY;  Surgeon: [name], MD;  Location: [facility];  Service: General;  Laterality: N/A;   PORT-A-CATH REMOVAL Left 10/11/2015   Procedure: REMOVAL PORT-A-CATH;  Surgeon: [name], MD;  Location: [place];  Service: General;  Laterality: Left;   PORTACATH PLACEMENT Left 07/31/2014   Procedure: INSERTION PORT-A-CATH;  Surgeon: Stark Klein, MD;  Location: [place];  Service: General;  Laterality: Left;   UPPER GI ENDOSCOPY  2015      Inpatient Medications: Scheduled Meds:  arformoterol  15 mcg Nebulization BID   budesonide (PULMICORT) nebulizer solution  0.5 mg Nebulization BID  citalopram  40 mg Oral QHS   cyanocobalamin  1,000 mcg Oral Daily   dextromethorphan-guaiFENesin  1 tablet Oral BID   doxycycline  100 mg Oral Q12H   enoxaparin (LOVENOX) injection  40 mg Subcutaneous Q24H   gabapentin  300 mg Oral TID   insulin aspart  0-15 Units Subcutaneous TID WC   insulin aspart  0-5 Units Subcutaneous QHS   insulin glargine-yfgn  15 Units Subcutaneous QHS   [START ON 01/20/2023] levothyroxine  50 mcg Oral QAC breakfast   methylPREDNISolone (SOLU-MEDROL) injection  40 mg Intravenous Q12H    nitroGLYCERIN  0.4 mg Transdermal Daily   nitroGLYCERIN       pantoprazole  40 mg Oral Daily   pravastatin  20 mg Oral QHS   QUEtiapine  400 mg Oral QHS   sodium chloride flush  3 mL Intravenous Q12H   tamsulosin  0.4 mg Oral BID   Continuous Infusions:  sodium chloride     PRN Meds: sodium chloride, acetaminophen **OR** acetaminophen, HYDROmorphone (DILAUDID) injection, levalbuterol, nitroGLYCERIN, nitroGLYCERIN, ondansetron **OR** ondansetron (ZOFRAN) IV, perflutren lipid microspheres (DEFINITY) IV suspension, sodium chloride flush  Allergies:    Allergies  Allergen Reactions   Bee Venom Anaphylaxis   Ibuprofen Shortness Of Breath and Swelling   Naproxen Anaphylaxis and Swelling   Prednisone     Social History:   Social History   Socioeconomic History   Marital status: Married    Spouse name: Caren Griffins   Number of children: 2   Years of education: Not on file   Highest education level: Not on file  Occupational History   Occupation: retired    Comment: trucking  Tobacco Use   Smoking status: Former    Packs/day: 1.00    Years: 30.00    Total pack years: 30.00    Types: Cigarettes    Quit date: 06/23/2004    Years since quitting: 18.5   Smokeless tobacco: Never  Substance and Sexual Activity   Alcohol use: No   Drug use: No   Sexual activity: Not on file  Other Topics Concern   Not on file  Social History Narrative   Married to wife, Caren Griffins for 45+ years   #2 grown children: Otila Kluver and Antony Haste   #3 grandchildren- youngest 20 years   Retired Advice worker    Social Determinants of Radio broadcast assistant Strain: Not on Art therapist Insecurity: Not on file  Transportation Needs: Not on file  Physical Activity: Not on file  Stress: Not on file  Social Connections: Not on file  Intimate Partner Violence: Not on file    Family History:    Family History  Problem Relation Age of Onset   Heart disease Mother      ROS:  Please see the history of present  illness.   All other ROS reviewed and negative.     Physical Exam/Data:   Vitals:   01/19/23 1100 01/19/23 1112 01/19/23 1116 01/19/23 1120  BP:  126/80 128/78 108/75  Pulse:    (!) 107  Resp:      Temp:      TempSrc:      SpO2: 94%     Weight:      Height:        Intake/Output Summary (Last 24 hours) at 01/19/2023 1416 Last data filed at 01/19/2023 0932 Gross per 24 hour  Intake 1999 ml  Output --  Net 1999 ml      01/19/2023   10:41 AM  04/26/2021    2:43 PM 09/10/2020   10:02 AM  Last 3 Weights  Weight (lbs) 182 lb 14.4 oz 195 lb 15.8 oz 196 lb  Weight (kg) 82.963 kg 88.9 kg 88.905 kg     Body mass index is 27.01 kg/m.  General:  Well nourished, well developed, in no acute distress HEENT: normal Neck: no JVD Vascular: No carotid bruits; Distal pulses 2+ bilaterally Cardiac:  normal S1, S2; RRR; no murmur  Lungs:  clear to auscultation bilaterally, no wheezing, rhonchi or rales  Abd: soft, nontender, no hepatomegaly  Ext: no edema Musculoskeletal:  No deformities, BUE and BLE strength normal and equal Skin: warm and dry  Neuro:  CNs 2-12 intact, no focal abnormalities noted Psych:  Normal affect     Laboratory Data:  High Sensitivity Troponin:   Recent Labs  Lab 01/19/23 0240 01/19/23 0440  TROPONINIHS 5 5     Chemistry Recent Labs  Lab 01/19/23 0240 01/19/23 1103  NA 135  --   K 4.2  --   CL 100  --   CO2 22  --   GLUCOSE 267*  --   BUN 15  --   CREATININE 0.95 0.92  CALCIUM 9.2  --   MG  --  1.9  GFRNONAA >60 >60  ANIONGAP 13  --     Recent Labs  Lab 01/19/23 0240  PROT 7.6  ALBUMIN 4.2  AST 75*  ALT 103*  ALKPHOS 94  BILITOT 1.1   Lipids No results for input(s): "CHOL", "TRIG", "HDL", "LABVLDL", "LDLCALC", "CHOLHDL" in the last 168 hours.  Hematology Recent Labs  Lab 01/19/23 0240 01/19/23 1103  WBC 14.2* 15.7*  RBC 4.36 3.90*  HGB 14.3 12.8*  HCT 42.7 38.6*  MCV 97.9 99.0  MCH 32.8 32.8  MCHC 33.5 33.2  RDW 13.7 13.9   PLT 174 149*   Thyroid  Recent Labs  Lab 01/19/23 1103  TSH 2.513    BNP Recent Labs  Lab 01/19/23 0240  BNP 45.0    DDimer No results for input(s): "DDIMER" in the last 168 hours.   Radiology/Studies:  ECHOCARDIOGRAM COMPLETE  Result Date: 01/19/2023    ECHOCARDIOGRAM REPORT   Patient Name:   TESLA BOCHICCHIO Date of Exam: 01/19/2023 Medical Rec #:  [id number]       Height:       69.0 in Accession #:    [id number]      Weight:       182.9 lb Date of Birth:  [birth year]        BSA:          1.989 m Patient Age:    40 years        BP:           108/75 mmHg Patient Gender: M               HR:           97 bpm. Exam Location:  [facility] Procedure: 2D Echo, Cardiac Doppler and Color Doppler Indications:    Colon cancer ([place]) (From Hx)  History:        Patient has no prior history of Echocardiogram examinations.                 COPD; Risk Factors:Diabetes and Dyslipidemia. Acute respiratory                 failure with hypoxia ([place]), Colon cancer ([place]) (From Hx).  Sonographer:  Alvino Chapel RCS Referring Phys: [place]  1. Left ventricular ejection fraction, by estimation, is 65 to 70%. The left ventricle has normal function. The left ventricle has no regional wall motion abnormalities. There is mild left ventricular hypertrophy. Left ventricular diastolic parameters are indeterminate. Elevated left atrial pressure.  2. Right ventricular systolic function is normal. The right ventricular size is normal.  3. There is no evidence of cardiac tamponade.  4. The mitral valve is normal in structure. No evidence of mitral valve regurgitation. No evidence of mitral stenosis.  5. The aortic valve is tricuspid. Aortic valve regurgitation is not visualized. No aortic stenosis is present.  6. The inferior vena cava is normal in size with greater than 50% respiratory variability, suggesting right atrial pressure of 3 mmHg. FINDINGS  Left Ventricle: Left ventricular ejection fraction, by  estimation, is 65 to 70%. The left ventricle has normal function. The left ventricle has no regional wall motion abnormalities. Definity contrast agent was given IV to delineate the left ventricular  endocardial borders. The left ventricular internal cavity size was normal in size. There is mild left ventricular hypertrophy. Left ventricular diastolic parameters are indeterminate. Elevated left atrial pressure. Right Ventricle: The right ventricular size is normal. Right vetricular wall thickness was not well visualized. Right ventricular systolic function is normal. Left Atrium: Left atrial size was normal in size. Right Atrium: Right atrial size was normal in size. Pericardium: Trivial pericardial effusion is present. The pericardial effusion is circumferential. There is no evidence of cardiac tamponade. Mitral Valve: The mitral valve is normal in structure. No evidence of mitral valve regurgitation. No evidence of mitral valve stenosis. Tricuspid Valve: The tricuspid valve is normal in structure. Tricuspid valve regurgitation is not demonstrated. No evidence of tricuspid stenosis. Aortic Valve: The aortic valve is tricuspid. Aortic valve regurgitation is not visualized. No aortic stenosis is present. Aortic valve mean gradient measures 3.7 mmHg. Aortic valve peak gradient measures 7.3 mmHg. Aortic valve area, by VTI measures 2.47 cm. Pulmonic Valve: The pulmonic valve was not well visualized. Pulmonic valve regurgitation is not visualized. No evidence of pulmonic stenosis. Aorta: The aortic root is normal in size and structure. Venous: The inferior vena cava is normal in size with greater than 50% respiratory variability, suggesting right atrial pressure of 3 mmHg. IAS/Shunts: No atrial level shunt detected by color flow Doppler.  LEFT VENTRICLE PLAX 2D LVIDd:         4.70 cm   Diastology LVIDs:         2.60 cm   LV e' medial:    5.66 cm/s LV PW:         1.10 cm   LV E/e' medial:  16.7 LV IVS:        1.10 cm    LV e' lateral:   6.09 cm/s LVOT diam:     2.10 cm   LV E/e' lateral: 15.5 LV SV:         52 LV SV Index:   26 LVOT Area:     3.46 cm  RIGHT VENTRICLE RV S prime:     14.10 cm/s TAPSE (M-mode): 1.6 cm LEFT ATRIUM           Index        RIGHT ATRIUM           Index LA diam:      3.80 cm 1.91 cm/m   RA Area:     13.60 cm LA Vol (A2C): 53.8  ml 27.05 ml/m  RA Volume:   35.90 ml  18.05 ml/m LA Vol (A4C): 54.0 ml 27.15 ml/m  AORTIC VALVE AV Area (Vmax):    2.09 cm AV Area (Vmean):   2.30 cm AV Area (VTI):     2.47 cm AV Vmax:           135.12 cm/s AV Vmean:          90.643 cm/s AV VTI:            0.211 m AV Peak Grad:      7.3 mmHg AV Mean Grad:      3.7 mmHg LVOT Vmax:         81.60 cm/s LVOT Vmean:        60.100 cm/s LVOT VTI:          0.150 m LVOT/AV VTI ratio: 0.71  AORTA Ao Root diam: 3.80 cm MITRAL VALVE MV Area (PHT): 3.27 cm    SHUNTS MV Decel Time: 232 msec    Systemic VTI:  0.15 m MV E velocity: 94.30 cm/s  Systemic Diam: 2.10 cm MV A velocity: 94.70 cm/s MV E/A ratio:  1.00 Carlyle Dolly MD Electronically signed by Carlyle Dolly MD Signature Date/Time: 01/19/2023/1:15:17 PM    Final    US Abdomen Limited RUQ (LIVER/GB)  Result Date: 01/19/2023 CLINICAL DATA:  Abdominal pain EXAM: ULTRASOUND ABDOMEN LIMITED RIGHT UPPER QUADRANT COMPARISON:  CT chest abdomen pelvis 01/19/2023 FINDINGS: Gallbladder: No gallstones or wall thickening visualized. No sonographic Murphy sign noted by sonographer. Common bile duct: Diameter: 4.1 mm Liver: No focal hepatic mass. Heterogeneous hepatic echogenicity with overall increased echogenicity concerning for hepatocellular disease. Portal vein is patent on color Doppler imaging with normal direction of blood flow towards the liver. Other: None. IMPRESSION: 1. No cholelithiasis or sonographic evidence of acute cholecystitis. 2. Heterogeneous hepatic echogenicity with overall increased echogenicity concerning for hepatocellular disease. Electronically Signed   By: Kathreen Devoid M.D.   On: 01/19/2023 08:22   CT Angio Chest/Abd/Pel for Dissection W and/or Wo Contrast  Result Date: 01/19/2023 CLINICAL DATA:  Chest pain and pressure EXAM: CT ANGIOGRAPHY CHEST, ABDOMEN AND PELVIS TECHNIQUE: Non-contrast CT of the chest was initially obtained. Multidetector CT imaging through the chest, abdomen and pelvis was performed using the standard protocol during bolus administration of intravenous contrast. Multiplanar reconstructed images and MIPs were obtained and reviewed to evaluate the vascular anatomy. RADIATION DOSE REDUCTION: This exam was performed according to the departmental dose-optimization program which includes automated exposure control, adjustment of the mA and/or kV according to patient size and/or use of iterative reconstruction technique. CONTRAST:  139m OMNIPAQUE IOHEXOL 350 MG/ML SOLN COMPARISON:  08/26/2016 abdominal CT FINDINGS: CTA CHEST FINDINGS Cardiovascular: Preferential opacification of the thoracic aorta. No evidence of thoracic aortic aneurysm or dissection. Normal heart size. Trace pericardial effusion or thickening. Extensive atheromatous calcification of the aorta and coronaries. Mediastinum/Nodes: No hematoma or adenopathy Lungs/Pleura: Centrilobular emphysema. There is also subpleural lower lobe fibrotic changes with honeycombing. Musculoskeletal: Diffuse bridging thoracic osteophytes. Review of the MIP images confirms the above findings. CTA ABDOMEN AND PELVIS FINDINGS VASCULAR Aorta: Extensive atheromatous calcification of the aorta. Infrarenal fusiform aneurysm measuring 2.9 cm, 1.5 times the proximal normal segment. Celiac: Atheromatous plaque at the ostium. No Abass Misener vessel beading occlusion, or aneurysm. SMA: Replaced hepatic artery to the SMA. Atheromatous plaque proximally. No acute finding or beading. Renals: Atheromatous plaque at the proximal renal arteries without flow reducing stenosis. Negative for aneurysm or dissection. IMA: Patent  Inflow:  Scattered atheromatous plaque.  No dissection or aneurysm Veins: Unremarkable in the arterial phase Review of the MIP images confirms the above findings. NON-VASCULAR Lower chest:  No contributory findings. Hepatobiliary: No focal liver abnormality.Cholelithiasis. No evidence of biliary inflammation Pancreas: Fat infiltration.  No acute finding Spleen: Unremarkable. Adrenals/Urinary Tract: Negative adrenals. No hydronephrosis or stone. Bilateral renal cysts, larger at the left interpolar kidney measuring 6.3 cm. Both appear simple with no follow-up imaging recommended. Unremarkable bladder. Stomach/Bowel: Proximal colectomy. No visible bowel inflammation. Sigmoid diverticulosis. Large third portion duodenum diverticulum but uncomplicated appearing Lymphatic: No mass or adenopathy. Reproductive:No acute finding Other: No ascites or pneumoperitoneum. Musculoskeletal: Remote L1 and L3 endplate fractures. Generalized degeneration and osteopenia. Review of the MIP images confirms the above findings. IMPRESSION: 1. No evidence of acute aortic syndrome. 2. Trace pericardial effusion or thickening since 2017. 3. 2.9 cm infrarenal aortic aneurysm. Recommend follow-up ultrasound every 5 years. This recommendation follows ACR consensus guidelines: White Paper of the ACR Incidental Findings Committee II on Vascular Findings. J Am Coll Radiol 2013; 50:932-671. 4. Aortic Atherosclerosis (ICD10-I70.0) and Emphysema (ICD10-J43.9). Coronary atherosclerosis. 5. Colonic diverticulosis, cholelithiasis, and multilevel spinal ankylosis. Electronically Signed   By: Jorje Guild M.D.   On: 01/19/2023 04:14   DG Chest Portable 1 View  Result Date: 01/19/2023 CLINICAL DATA:  Evaluate for pneumothorax EXAM: PORTABLE CHEST 1 VIEW COMPARISON:  04/26/2021 FINDINGS: Heart and mediastinal contours are within normal limits. No focal opacities or effusions. No acute bony abnormality. Aortic atherosclerosis. No pneumothorax. IMPRESSION: No  active cardiopulmonary disease. Electronically Signed   By: Rolm Baptise M.D.   On: 01/19/2023 03:16     Assessment and Plan:   1.Chest pain -mixed chest pain symptoms. Sharp pain left sided 10/10 with associated SOB/DOE, diaphoretic, nausea. Lasting several hours, worst with position. Did improve with SL NG. Multiple CAD risk factors including DM2, 40+year smoking hisory. Certainly at risk for atypical angina given DM2. Coronary atherosclerosis noted on CTA.    - trops neg, EKG inferior Qwaves no acute ischemic changes - echo LVEF 65-70%, no WMAs - CTA without acute aortic pathology, +aorthic atherosclerosis  - symptoms worrisome enough for unstable angina in patient with strong risk factors. Think best course of action would be a definitive heart catheterization.  -start hep gtt, place nitro patch. If recurrent pain could escalate to nitro gtt. Severe NSAID allergy but I invididually confirmed with the patient today that he does not have an aspirin allergy, will start '81mg'$  daily.    - plan for transfer to [place] today to hospitalist service, cath tomorrow.    Shared Decision Making/Informed Consent The risks [stroke (1 in 1000), death (1 in 1000), kidney failure [usually temporary] (1 in 500), bleeding (1 in 200), allergic reaction [possibly serious] (1 in 200)], benefits (diagnostic support and management of coronary artery disease) and alternatives of a cardiac catheterization were discussed in detail with Mr. Conly and he is willing to proceed.   2.Aortic aneurysm 2.9 cm infrarenal aortic aneurysm. Recommend follow-up ultrasound every 5 years.  Risk Assessment/Risk Scores:    TIMI Risk Score for Unstable Angina or Non-ST Elevation MI:   The patient's TIMI risk score is 3, which indicates a 13% risk of all cause mortality, new or recurrent myocardial infarction or need for urgent revascularization in the next 14 days.{   For questions or updates, please contact [place] Please consult [email or web address] for contact info under    Signed, Carlyle Dolly, MD  01/19/2023 2:16 PM

## 2023-01-19 NOTE — ED Notes (Signed)
Pt felt dizzy before getting out of bed, pt was redirected to sit on the each of the bed before standing. Upon walking, pt has to hand on to me for assistance and stated he felt dizzy and lightheaded. Pt had to sit along the way to catch his breath before returning back to the room. Pt O2 stats dropped to 86% on room air.

## 2023-01-19 NOTE — Progress Notes (Signed)
*  PRELIMINARY RESULTS* Echocardiogram 2D Echocardiogram has been performed with Definity.  [name] 01/19/2023, 12:49 PM

## 2023-01-19 NOTE — H&P (View-Only) (Signed)
Cardiology Consultation   Patient ID: Matthew [name] MRN: [id number]; DOB: 1946/10/19  Admit date: 01/19/2023 Date of Consult: 01/19/2023  PCP:  Clinic, [place] Prov[facility] Cardiologist: New     Patient Profile:   Matthew [name] is a 77 y.o. male with a hx of DM2, bipolar, COPD  who is being seen 01/19/2023 for the evaluation of chest pain at the request of Matthew [name].  History of Present Illness:   Matthew [name] 77 yo male history of DM2, bipolar, colon cancer s/p hemicolectomy and chemo 2016, COPD, presents with chest pain and hypoxia.  Reports chest pain started Friday around 1pm while cutting wood with chain saw and hauling it. 10/10 sharp pain left chest, felt hot sweaty, nauseous. Sat down and rested pain improved over 30-40 minutes but did not resolve completely. On and off pain throughout Saturday and Sunday. Increasing SOB, some nausea/vomiting over the weeekend. Chest pain progressed Sunday and came to ER. Since arrival chest pain improved with SL NG. Pain can be worst with movement, deep breathing.     WBC 14.2 Plt 174 Hgb 14.3 K 4.2 Cr 0.95 BUN 15 BNP 45 Trop 5-->5--> EKG: SR, inferior Qwaves CXR no acute process Echo: LVEF 65-70%, no WMAs, trivial circumferential pericardial effusion  CTA C/A/P no acute aortic pathology. 2.9 cm infrarenal aneurysm needs [place] 5 years. +coronary atherosclerosis RUQ [place]: No cholelithiasis or sonographic evidence of acute cholecystitis.Heterogeneous hepatic echogenicity with overall increased echogenicity concerning for hepatocellular disease.    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--  s/p  right hemicolectomy 06-30-2014  and chemotherapy complete [id number]   Complication of anesthesia    "I GET REAL COLD AND CAN'T  URINATE"---  urinary retention   COPD with emphysema (HCC)    Cough    Diverticulosis of colon    GERD (gastroesophageal reflux disease)    History of adenomatous polyp of colon    History of bleeding peptic ulcer    2000   History of diverticulitis of colon    History of panic attacks    Hyperlipidemia    Osteoporosis    PTSD (post-traumatic stress disorder)    [place]   Rectus diastasis    Wears dentures    upper    Past Surgical History:  Procedure Laterality Date   ANTERIOR CERVICAL DECOMP/DISCECTOMY FUSION  04-18-2003   C5 -- 6   APPENDECTOMY  yrs ago   BACK SURGERY  [phone]   lumbar lam and fusions   CIRCUMCISION  03-05-2005   COLONOSCOPY  [phone]   LAPAROSCOPIC PARTIAL COLECTOMY N/A 06/30/2014   Procedure: LAPAROSCOPIC right COLECTOMY;  Surgeon: Matthew Ruff, MD;  Location: [facility];  Service: General;  Laterality: N/A;   PORT-A-CATH REMOVAL Left 10/11/2015   Procedure: REMOVAL PORT-A-CATH;  Surgeon: Matthew Ruff, MD;  Location: [place];  Service: General;  Laterality: Left;   PORTACATH PLACEMENT Left 07/31/2014   Procedure: INSERTION PORT-A-CATH;  Surgeon: Matthew Klein, MD;  Location: [place];  Service: General;  Laterality: Left;   UPPER GI ENDOSCOPY  2015      Inpatient Medications: Scheduled Meds:  arformoterol  15 mcg Nebulization BID   budesonide (PULMICORT) nebulizer solution  0.5 mg Nebulization BID  citalopram  40 mg Oral QHS   cyanocobalamin  1,000 mcg Oral Daily   dextromethorphan-guaiFENesin  1 tablet Oral BID   doxycycline  100 mg Oral Q12H   enoxaparin (LOVENOX) injection  40 mg Subcutaneous Q24H   gabapentin  300 mg Oral TID   insulin aspart  0-15 Units Subcutaneous TID WC   insulin aspart  0-5 Units Subcutaneous QHS   insulin glargine-yfgn  15 Units Subcutaneous QHS   [START ON 01/20/2023] levothyroxine  50 mcg Oral QAC breakfast   methylPREDNISolone (SOLU-MEDROL) injection  40 mg Intravenous Q12H    nitroGLYCERIN  0.4 mg Transdermal Daily   nitroGLYCERIN       pantoprazole  40 mg Oral Daily   pravastatin  20 mg Oral QHS   QUEtiapine  400 mg Oral QHS   sodium chloride flush  3 mL Intravenous Q12H   tamsulosin  0.4 mg Oral BID   Continuous Infusions:  sodium chloride     PRN Meds: sodium chloride, acetaminophen **OR** acetaminophen, HYDROmorphone (DILAUDID) injection, levalbuterol, nitroGLYCERIN, nitroGLYCERIN, ondansetron **OR** ondansetron (ZOFRAN) IV, perflutren lipid microspheres (DEFINITY) IV suspension, sodium chloride flush  Allergies:    Allergies  Allergen Reactions   Bee Venom Anaphylaxis   Ibuprofen Shortness Of Breath and Swelling   Naproxen Anaphylaxis and Swelling   Prednisone     Social History:   Social History   Socioeconomic History   Marital status: Married    Spouse name: Matthew [name]   Number of children: 2   Years of education: Not on file   Highest education level: Not on file  Occupational History   Occupation: retired    Comment: trucking  Tobacco Use   Smoking status: Former    Packs/day: 1.00    Years: 30.00    Total pack years: 30.00    Types: Cigarettes    Quit date: 06/23/2004    Years since quitting: 18.5   Smokeless tobacco: Never  Substance and Sexual Activity   Alcohol use: No   Drug use: No   Sexual activity: Not on file  Other Topics Concern   Not on file  Social History Narrative   Married to wife, Matthew [name] for 45+ years   #2 grown children: Matthew [name] and Matthew [name]   #3 grandchildren- youngest 69 years   Retired Advice worker    Social Determinants of Radio broadcast assistant Strain: Not on Art therapist Insecurity: Not on file  Transportation Needs: Not on file  Physical Activity: Not on file  Stress: Not on file  Social Connections: Not on file  Intimate Partner Violence: Not on file    Family History:    Family History  Problem Relation Age of Onset   Heart disease Mother      ROS:  Please see the history of present  illness.   All other ROS reviewed and negative.     Physical Exam/Data:   Vitals:   01/19/23 1100 01/19/23 1112 01/19/23 1116 01/19/23 1120  BP:  126/80 128/78 108/75  Pulse:    (!) 107  Resp:      Temp:      TempSrc:      SpO2: 94%     Weight:      Height:        Intake/Output Summary (Last 24 hours) at 01/19/2023 1416 Last data filed at 01/19/2023 0932 Gross per 24 hour  Intake 1999 ml  Output --  Net 1999 ml      01/19/2023   10:41 AM  04/26/2021    2:43 PM 09/10/2020   10:02 AM  Last 3 Weights  Weight (lbs) 182 lb 14.4 oz 195 lb 15.8 oz 196 lb  Weight (kg) 82.963 kg 88.9 kg 88.905 kg     Body mass index is 27.01 kg/m.  General:  Well nourished, well developed, in no acute distress HEENT: normal Neck: no JVD Vascular: No carotid bruits; Distal pulses 2+ bilaterally Cardiac:  normal S1, S2; RRR; no murmur  Lungs:  clear to auscultation bilaterally, no wheezing, rhonchi or rales  Abd: soft, nontender, no hepatomegaly  Ext: no edema Musculoskeletal:  No deformities, BUE and BLE strength normal and equal Skin: warm and dry  Neuro:  CNs 2-12 intact, no focal abnormalities noted Psych:  Normal affect     Laboratory Data:  High Sensitivity Troponin:   Recent Labs  Lab 01/19/23 0240 01/19/23 0440  TROPONINIHS 5 5     Chemistry Recent Labs  Lab 01/19/23 0240 01/19/23 1103  NA 135  --   K 4.2  --   CL 100  --   CO2 22  --   GLUCOSE 267*  --   BUN 15  --   CREATININE 0.95 0.92  CALCIUM 9.2  --   MG  --  1.9  GFRNONAA >60 >60  ANIONGAP 13  --     Recent Labs  Lab 01/19/23 0240  PROT 7.6  ALBUMIN 4.2  AST 75*  ALT 103*  ALKPHOS 94  BILITOT 1.1   Lipids No results for input(s): "CHOL", "TRIG", "HDL", "LABVLDL", "LDLCALC", "CHOLHDL" in the last 168 hours.  Hematology Recent Labs  Lab 01/19/23 0240 01/19/23 1103  WBC 14.2* 15.7*  RBC 4.36 3.90*  HGB 14.3 12.8*  HCT 42.7 38.6*  MCV 97.9 99.0  MCH 32.8 32.8  MCHC 33.5 33.2  RDW 13.7 13.9   PLT 174 149*   Thyroid  Recent Labs  Lab 01/19/23 1103  TSH 2.513    BNP Recent Labs  Lab 01/19/23 0240  BNP 45.0    DDimer No results for input(s): "DDIMER" in the last 168 hours.   Radiology/Studies:  ECHOCARDIOGRAM COMPLETE  Result Date: 01/19/2023    ECHOCARDIOGRAM REPORT   Patient Name:   NILAN IDDINGS Date of Exam: 01/19/2023 Medical Rec #:  [id number]       Height:       69.0 in Accession #:    [id number]      Weight:       182.9 lb Date of Birth:  [birth year]        BSA:          1.989 m Patient Age:    71 years        BP:           108/75 mmHg Patient Gender: M               HR:           97 bpm. Exam Location:  [facility] Procedure: 2D Echo, Cardiac Doppler and Color Doppler Indications:    Colon cancer ([place]) (From Hx)  History:        Patient has no prior history of Echocardiogram examinations.                 COPD; Risk Factors:Diabetes and Dyslipidemia. Acute respiratory                 failure with hypoxia ([place]), Colon cancer ([place]) (From Hx).  Sonographer:  Alvino Chapel RCS Referring Phys: [place]  1. Left ventricular ejection fraction, by estimation, is 65 to 70%. The left ventricle has normal function. The left ventricle has no regional wall motion abnormalities. There is mild left ventricular hypertrophy. Left ventricular diastolic parameters are indeterminate. Elevated left atrial pressure.  2. Right ventricular systolic function is normal. The right ventricular size is normal.  3. There is no evidence of cardiac tamponade.  4. The mitral valve is normal in structure. No evidence of mitral valve regurgitation. No evidence of mitral stenosis.  5. The aortic valve is tricuspid. Aortic valve regurgitation is not visualized. No aortic stenosis is present.  6. The inferior vena cava is normal in size with greater than 50% respiratory variability, suggesting right atrial pressure of 3 mmHg. FINDINGS  Left Ventricle: Left ventricular ejection fraction, by  estimation, is 65 to 70%. The left ventricle has normal function. The left ventricle has no regional wall motion abnormalities. Definity contrast agent was given IV to delineate the left ventricular  endocardial borders. The left ventricular internal cavity size was normal in size. There is mild left ventricular hypertrophy. Left ventricular diastolic parameters are indeterminate. Elevated left atrial pressure. Right Ventricle: The right ventricular size is normal. Right vetricular wall thickness was not well visualized. Right ventricular systolic function is normal. Left Atrium: Left atrial size was normal in size. Right Atrium: Right atrial size was normal in size. Pericardium: Trivial pericardial effusion is present. The pericardial effusion is circumferential. There is no evidence of cardiac tamponade. Mitral Valve: The mitral valve is normal in structure. No evidence of mitral valve regurgitation. No evidence of mitral valve stenosis. Tricuspid Valve: The tricuspid valve is normal in structure. Tricuspid valve regurgitation is not demonstrated. No evidence of tricuspid stenosis. Aortic Valve: The aortic valve is tricuspid. Aortic valve regurgitation is not visualized. No aortic stenosis is present. Aortic valve mean gradient measures 3.7 mmHg. Aortic valve peak gradient measures 7.3 mmHg. Aortic valve area, by VTI measures 2.47 cm. Pulmonic Valve: The pulmonic valve was not well visualized. Pulmonic valve regurgitation is not visualized. No evidence of pulmonic stenosis. Aorta: The aortic root is normal in size and structure. Venous: The inferior vena cava is normal in size with greater than 50% respiratory variability, suggesting right atrial pressure of 3 mmHg. IAS/Shunts: No atrial level shunt detected by color flow Doppler.  LEFT VENTRICLE PLAX 2D LVIDd:         4.70 cm   Diastology LVIDs:         2.60 cm   LV e' medial:    5.66 cm/s LV PW:         1.10 cm   LV E/e' medial:  16.7 LV IVS:        1.10 cm    LV e' lateral:   6.09 cm/s LVOT diam:     2.10 cm   LV E/e' lateral: 15.5 LV SV:         52 LV SV Index:   26 LVOT Area:     3.46 cm  RIGHT VENTRICLE RV S prime:     14.10 cm/s TAPSE (M-mode): 1.6 cm LEFT ATRIUM           Index        RIGHT ATRIUM           Index LA diam:      3.80 cm 1.91 cm/m   RA Area:     13.60 cm LA Vol (A2C): 53.8  ml 27.05 ml/m  RA Volume:   35.90 ml  18.05 ml/m LA Vol (A4C): 54.0 ml 27.15 ml/m  AORTIC VALVE AV Area (Vmax):    2.09 cm AV Area (Vmean):   2.30 cm AV Area (VTI):     2.47 cm AV Vmax:           135.12 cm/s AV Vmean:          90.643 cm/s AV VTI:            0.211 m AV Peak Grad:      7.3 mmHg AV Mean Grad:      3.7 mmHg LVOT Vmax:         81.60 cm/s LVOT Vmean:        60.100 cm/s LVOT VTI:          0.150 m LVOT/AV VTI ratio: 0.71  AORTA Ao Root diam: 3.80 cm MITRAL VALVE MV Area (PHT): 3.27 cm    SHUNTS MV Decel Time: 232 msec    Systemic VTI:  0.15 m MV E velocity: 94.30 cm/s  Systemic Diam: 2.10 cm MV A velocity: 94.70 cm/s MV E/A ratio:  1.00 Carlyle Dolly MD Electronically signed by Carlyle Dolly MD Signature Date/Time: 01/19/2023/1:15:17 PM    Final    US Abdomen Limited RUQ (LIVER/GB)  Result Date: 01/19/2023 CLINICAL DATA:  Abdominal pain EXAM: ULTRASOUND ABDOMEN LIMITED RIGHT UPPER QUADRANT COMPARISON:  CT chest abdomen pelvis 01/19/2023 FINDINGS: Gallbladder: No gallstones or wall thickening visualized. No sonographic Murphy sign noted by sonographer. Common bile duct: Diameter: 4.1 mm Liver: No focal hepatic mass. Heterogeneous hepatic echogenicity with overall increased echogenicity concerning for hepatocellular disease. Portal vein is patent on color Doppler imaging with normal direction of blood flow towards the liver. Other: None. IMPRESSION: 1. No cholelithiasis or sonographic evidence of acute cholecystitis. 2. Heterogeneous hepatic echogenicity with overall increased echogenicity concerning for hepatocellular disease. Electronically Signed   By: Kathreen Devoid M.D.   On: 01/19/2023 08:22   CT Angio Chest/Abd/Pel for Dissection W and/or Wo Contrast  Result Date: 01/19/2023 CLINICAL DATA:  Chest pain and pressure EXAM: CT ANGIOGRAPHY CHEST, ABDOMEN AND PELVIS TECHNIQUE: Non-contrast CT of the chest was initially obtained. Multidetector CT imaging through the chest, abdomen and pelvis was performed using the standard protocol during bolus administration of intravenous contrast. Multiplanar reconstructed images and MIPs were obtained and reviewed to evaluate the vascular anatomy. RADIATION DOSE REDUCTION: This exam was performed according to the departmental dose-optimization program which includes automated exposure control, adjustment of the mA and/or kV according to patient size and/or use of iterative reconstruction technique. CONTRAST:  161m OMNIPAQUE IOHEXOL 350 MG/ML SOLN COMPARISON:  08/26/2016 abdominal CT FINDINGS: CTA CHEST FINDINGS Cardiovascular: Preferential opacification of the thoracic aorta. No evidence of thoracic aortic aneurysm or dissection. Normal heart size. Trace pericardial effusion or thickening. Extensive atheromatous calcification of the aorta and coronaries. Mediastinum/Nodes: No hematoma or adenopathy Lungs/Pleura: Centrilobular emphysema. There is also subpleural lower lobe fibrotic changes with honeycombing. Musculoskeletal: Diffuse bridging thoracic osteophytes. Review of the MIP images confirms the above findings. CTA ABDOMEN AND PELVIS FINDINGS VASCULAR Aorta: Extensive atheromatous calcification of the aorta. Infrarenal fusiform aneurysm measuring 2.9 cm, 1.5 times the proximal normal segment. Celiac: Atheromatous plaque at the ostium. No Rashad Auld vessel beading occlusion, or aneurysm. SMA: Replaced hepatic artery to the SMA. Atheromatous plaque proximally. No acute finding or beading. Renals: Atheromatous plaque at the proximal renal arteries without flow reducing stenosis. Negative for aneurysm or dissection. IMA: Patent  Inflow:  Scattered atheromatous plaque.  No dissection or aneurysm Veins: Unremarkable in the arterial phase Review of the MIP images confirms the above findings. NON-VASCULAR Lower chest:  No contributory findings. Hepatobiliary: No focal liver abnormality.Cholelithiasis. No evidence of biliary inflammation Pancreas: Fat infiltration.  No acute finding Spleen: Unremarkable. Adrenals/Urinary Tract: Negative adrenals. No hydronephrosis or stone. Bilateral renal cysts, larger at the left interpolar kidney measuring 6.3 cm. Both appear simple with no follow-up imaging recommended. Unremarkable bladder. Stomach/Bowel: Proximal colectomy. No visible bowel inflammation. Sigmoid diverticulosis. Large third portion duodenum diverticulum but uncomplicated appearing Lymphatic: No mass or adenopathy. Reproductive:No acute finding Other: No ascites or pneumoperitoneum. Musculoskeletal: Remote L1 and L3 endplate fractures. Generalized degeneration and osteopenia. Review of the MIP images confirms the above findings. IMPRESSION: 1. No evidence of acute aortic syndrome. 2. Trace pericardial effusion or thickening since 2017. 3. 2.9 cm infrarenal aortic aneurysm. Recommend follow-up ultrasound every 5 years. This recommendation follows ACR consensus guidelines: White Paper of the ACR Incidental Findings Committee II on Vascular Findings. J Am Coll Radiol 2013; 41:287-867. 4. Aortic Atherosclerosis (ICD10-I70.0) and Emphysema (ICD10-J43.9). Coronary atherosclerosis. 5. Colonic diverticulosis, cholelithiasis, and multilevel spinal ankylosis. Electronically Signed   By: Jorje Guild M.D.   On: 01/19/2023 04:14   DG Chest Portable 1 View  Result Date: 01/19/2023 CLINICAL DATA:  Evaluate for pneumothorax EXAM: PORTABLE CHEST 1 VIEW COMPARISON:  04/26/2021 FINDINGS: Heart and mediastinal contours are within normal limits. No focal opacities or effusions. No acute bony abnormality. Aortic atherosclerosis. No pneumothorax. IMPRESSION: No  active cardiopulmonary disease. Electronically Signed   By: Rolm Baptise M.D.   On: 01/19/2023 03:16     Assessment and Plan:   1.Chest pain -mixed chest pain symptoms. Sharp pain left sided 10/10 with associated SOB/DOE, diaphoretic, nausea. Lasting several hours, worst with position. Did improve with SL NG. Multiple CAD risk factors including DM2, 40+year smoking hisory. Certainly at risk for atypical angina given DM2. Coronary atherosclerosis noted on CTA.    - trops neg, EKG inferior Qwaves no acute ischemic changes - echo LVEF 65-70%, no WMAs - CTA without acute aortic pathology, +aorthic atherosclerosis  - symptoms worrisome enough for unstable angina in patient with strong risk factors. Think best course of action would be a definitive heart catheterization.  -start hep gtt, place nitro patch. If recurrent pain could escalate to nitro gtt. Severe NSAID allergy but I invididually confirmed with the patient today that he does not have an aspirin allergy, will start '81mg'$  daily.    - plan for transfer to [place] today to hospitalist service, cath tomorrow.    Shared Decision Making/Informed Consent The risks [stroke (1 in 1000), death (1 in 1000), kidney failure [usually temporary] (1 in 500), bleeding (1 in 200), allergic reaction [possibly serious] (1 in 200)], benefits (diagnostic support and management of coronary artery disease) and alternatives of a cardiac catheterization were discussed in detail with Mr. Koller and he is willing to proceed.   2.Aortic aneurysm 2.9 cm infrarenal aortic aneurysm. Recommend follow-up ultrasound every 5 years.  Risk Assessment/Risk Scores:    TIMI Risk Score for Unstable Angina or Non-ST Elevation MI:   The patient's TIMI risk score is 3, which indicates a 13% risk of all cause mortality, new or recurrent myocardial infarction or need for urgent revascularization in the next 14 days.{   For questions or updates, please contact [place] Please consult [email or web address] for contact info under    Signed, Carlyle Dolly, MD  01/19/2023 2:16 PM

## 2023-01-19 NOTE — Progress Notes (Signed)
ANTICOAGULATION CONSULT NOTE - Initial Consult  Pharmacy Consult for Heparin Indication: chest pain/ACS  Allergies  Allergen Reactions   Bee Venom Anaphylaxis   Ibuprofen Shortness Of Breath and Swelling    Spoke with patient 01/19/23 he assures me no allergy to aspirin, only other NSAIDs   Naproxen Anaphylaxis and Swelling   Prednisone     Patient Measurements: Height: '5\' 9"'$  (175.3 cm) Weight: 81.6 kg (179 lb 14.3 oz) IBW/kg (Calculated) : 70.7 HEPARIN DW (KG): 81.6   Vital Signs: Temp: 99 F (37.2 C) (02/05 2109) Temp Source: Oral (02/05 2109) BP: 135/94 (02/05 2305) Pulse Rate: 99 (02/05 2305)  Labs: Recent Labs    01/19/23 0240 01/19/23 0440 01/19/23 1103 01/19/23 1508 01/19/23 1704 01/19/23 2228  HGB 14.3  --  12.8*  --   --   --   HCT 42.7  --  38.6*  --   --   --   PLT 174  --  149*  --   --   --   HEPARINUNFRC  --   --   --   --   --  0.57  CREATININE 0.95  --  0.92  --   --   --   TROPONINIHS 5 5  --  7 8  --      Estimated Creatinine Clearance: 70.4 mL/min (by C-G formula based on SCr of 0.92 mg/dL).   Medical History: Past Medical History:  Diagnosis Date   Anxiety    Arthritis    Bipolar 1 disorder ([place])    Chronic back pain    Colon cancer [facility]) oncologist-  dr Benay Spice   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--  s/p  right hemicolectomy 06-30-2014  and chemotherapy complete [id number]   Complication of anesthesia    "I GET REAL COLD AND CAN'T URINATE"---  urinary retention   COPD with emphysema (HCC)    Cough    Diverticulosis of colon    GERD (gastroesophageal reflux disease)    History of adenomatous polyp of colon    History of bleeding peptic ulcer    2000   History of diverticulitis of colon    History of panic attacks    Hyperlipidemia    Osteoporosis    PTSD (post-traumatic stress disorder)    [place]   Rectus diastasis    Wears dentures    upper    Medications:  Medications Prior to Admission   Medication Sig Dispense Refill Last Dose   acetaminophen (TYLENOL) 500 MG tablet Take 1,000 mg by mouth every 8 (eight) hours as needed for moderate pain.   01/18/2023   atorvastatin (LIPITOR) 40 MG tablet Take 40 mg by mouth daily.   Past Week   Cholecalciferol 50 MCG (2000 UT) TABS Take 1 tablet by mouth daily.   01/18/2023   cyclobenzaprine (FLEXERIL) 10 MG tablet Take 1 tablet (10 mg total) by mouth at bedtime. (Patient taking differently: Take 10 mg by mouth 3 (three) times daily.) 20 tablet 0 01/18/2023   EPINEPHrine 0.3 mg/0.3 mL IJ SOAJ injection Inject 0.3 mg into the muscle daily as needed (allergic reaction).   unknown   gabapentin (NEURONTIN) 300 MG capsule Take 600 mg by mouth 3 (three) times daily.   01/18/2023   glipiZIDE (GLUCOTROL) 10 MG tablet Take 1 tablet by mouth 2 (two) times daily.   01/18/2023   JARDIANCE 25 MG TABS tablet Take 25 mg by mouth daily.   01/18/2023   levothyroxine (SYNTHROID) 50 MCG  tablet Take 50 mcg by mouth daily before breakfast.   01/18/2023   lisinopril (PRINIVIL,ZESTRIL) 5 MG tablet Take 5 mg by mouth daily.   01/18/2023   metFORMIN (GLUCOPHAGE) 1000 MG tablet Take 1 tablet by mouth 2 (two) times daily.   01/18/2023   Multiple Vitamin (MULTIVITAMIN) tablet Take 1 tablet by mouth daily.   01/18/2023   naloxone (NARCAN) nasal spray 4 mg/0.1 mL Place into the nose.   unknown   Omega-3 Fatty Acids (FISH OIL) 1000 MG CAPS Take by mouth.   01/18/2023   omeprazole (PRILOSEC) 20 MG capsule Take 20 mg by mouth daily with breakfast.    01/18/2023   oxyCODONE (OXY IR/ROXICODONE) 5 MG immediate release tablet Take 5 mg by mouth See admin instructions. Take 1 and 1/2 tablets every 6 hours as needed for pain   Past Month   QUEtiapine (SEROQUEL) 400 MG tablet Take 400 mg by mouth at bedtime.   01/18/2023   Semaglutide,0.25 or 0.'5MG'$ /DOS, (OZEMPIC, 0.25 OR 0.5 MG/DOSE,) 2 MG/1.5ML SOPN 0.5 mg once a week.   01/17/2023   sildenafil (VIAGRA) 100 MG tablet Take 100 mg by mouth as needed for  erectile dysfunction.   unknown   tamsulosin (FLOMAX) 0.4 MG CAPS capsule Take 0.4 mg by mouth daily.   Past Week   vitamin B-12 (CYANOCOBALAMIN) 1000 MCG tablet Take 1,000 mcg by mouth daily.   01/18/2023   acyclovir (ZOVIRAX) 200 MG capsule       albuterol (VENTOLIN HFA) 108 (90 Base) MCG/ACT inhaler INHALE 2 PUFFS BY ORAL INHALATION 4 TIMES A DAY (Patient not taking: Reported on 01/19/2023)   Not Taking   citalopram (CELEXA) 40 MG tablet Take 40 mg by mouth at bedtime.      DM-Phenylephrine-Acetaminophen 10-5-325 MG/15ML LIQD Take by mouth as needed. (Patient not taking: Reported on 01/19/2023)   Not Taking   loperamide (IMODIUM A-D) 2 MG tablet TAKE CAPSULE(S) TABLET(S) BY MOUTH  AS NEEDED FOR LOOSE STOOLS   TAKE TWO TABLETS BY MOUTH INITIALLY AND TAKE ONE TABLET AS NEEDED FOR   DIARRHEA AFTER EACH LOOSE STOOL. MAX OF 4 TABLETS ('8MG'$ ) DAILY. DO NOT USE  FOR MORE  THAN 48 HOURS. FOR LOOSE STOOLS   TAKE TWO TABLETS BY MOUTH INITIALLY AND TAKE ONE TABLET AS NEEDED FOR    DIARRHEA AFTER EACH LOOSE STOOL. MAX OF 4 TABLETS ('8MG'$ ) DAILY. DO NOT USE   FOR MORE  THAN 48 HOURS. (Patient not taking: Reported on 01/19/2023)   Not Taking    Assessment: 77 y.o. male with a hx of DM2, bipolar, COPD  who presented to ED with chest pain . Patient not on any oral anticoagulation. Pharmacy asked to start heparin  Heparin level came back at 0.57. Cont current rate and check confirm in AM.  Goal of Therapy:  Heparin level 0.3-0.7 units/ml Monitor platelets by anticoagulation protocol: Yes   Plan:  Cont heparin at 1000 units/hr Check confirm level in AM Daily HL and CBC  Onnie Boer, PharmD, BCIDP, AAHIVP, CPP Infectious Disease Pharmacist 01/19/2023 11:27 PM

## 2023-01-20 ENCOUNTER — Encounter (HOSPITAL_COMMUNITY): Payer: Self-pay | Admitting: Cardiology

## 2023-01-20 ENCOUNTER — Encounter (HOSPITAL_COMMUNITY)
Admission: EM | Disposition: A | Discharge status: Home / Self Care | Payer: Self-pay | Source: Home / Self Care | Attending: Surgery

## 2023-01-20 DIAGNOSIS — I200 Unstable angina: Secondary | ICD-10-CM

## 2023-01-20 DIAGNOSIS — I25110 Atherosclerotic heart disease of native coronary artery with unstable angina pectoris: Secondary | ICD-10-CM | POA: Diagnosis not present

## 2023-01-20 DIAGNOSIS — J42 Unspecified chronic bronchitis: Secondary | ICD-10-CM

## 2023-01-20 DIAGNOSIS — J449 Chronic obstructive pulmonary disease, unspecified: Secondary | ICD-10-CM | POA: Diagnosis not present

## 2023-01-20 HISTORY — PX: LEFT HEART CATH AND CORONARY ANGIOGRAPHY: CATH118249

## 2023-01-20 LAB — HEPARIN LEVEL (UNFRACTIONATED): Heparin Unfractionated: 0.44 IU/mL (ref 0.30–0.70)

## 2023-01-20 LAB — CBC
HCT: 37.2 % — ABNORMAL LOW (ref 39.0–52.0)
Hemoglobin: 12.2 g/dL — ABNORMAL LOW (ref 13.0–17.0)
MCH: 32.6 pg (ref 26.0–34.0)
MCHC: 32.8 g/dL (ref 30.0–36.0)
MCV: 99.5 fL (ref 80.0–100.0)
Platelets: 139 10*3/uL — ABNORMAL LOW (ref 150–400)
RBC: 3.74 MIL/uL — ABNORMAL LOW (ref 4.22–5.81)
RDW: 14 % (ref 11.5–15.5)
WBC: 16 10*3/uL — ABNORMAL HIGH (ref 4.0–10.5)
nRBC: 0 % (ref 0.0–0.2)

## 2023-01-20 LAB — LIPID PANEL
Cholesterol: 123 mg/dL (ref 0–200)
HDL: 51 mg/dL (ref >40)
LDL Cholesterol: 54 mg/dL (ref 0–99)
Total CHOL/HDL Ratio: 2.4 RATIO
Triglycerides: 89 mg/dL (ref <150)
VLDL: 18 mg/dL (ref 0–40)

## 2023-01-20 LAB — GLUCOSE, CAPILLARY
Glucose-Capillary: 235 mg/dL — ABNORMAL HIGH (ref 70–99)
Glucose-Capillary: 232 mg/dL — ABNORMAL HIGH (ref 70–99)
Glucose-Capillary: 309 mg/dL — ABNORMAL HIGH (ref 70–99)
Glucose-Capillary: 137 mg/dL — ABNORMAL HIGH (ref 70–99)

## 2023-01-20 LAB — BASIC METABOLIC PANEL
Anion gap: 9 (ref 5–15)
BUN: 16 mg/dL (ref 8–23)
CO2: 22 mmol/L (ref 22–32)
Calcium: 9.1 mg/dL (ref 8.9–10.3)
Chloride: 105 mmol/L (ref 98–111)
Creatinine, Ser: 0.87 mg/dL (ref 0.61–1.24)
GFR, Estimated: >60 mL/min (ref >60)
Glucose, Bld: 191 mg/dL — ABNORMAL HIGH (ref 70–99)
Potassium: 4.7 mmol/L (ref 3.5–5.1)
Sodium: 136 mmol/L (ref 135–145)

## 2023-01-20 SURGERY — LEFT HEART CATH AND CORONARY ANGIOGRAPHY
Anesthesia: LOCAL

## 2023-01-20 MED ORDER — HEPARIN (PORCINE) IN NACL 1000-0.9 UT/500ML-% IV SOLN
INTRAVENOUS | Status: AC
  Filled 2023-01-20: qty 1000

## 2023-01-20 MED ORDER — HEPARIN (PORCINE) IN NACL 1000-0.9 UT/500ML-% IV SOLN
INTRAVENOUS | Status: DC | PRN
  Administered 2023-01-20 (×2): 500 mL

## 2023-01-20 MED ORDER — IOHEXOL 350 MG/ML SOLN
INTRAVENOUS | Status: DC | PRN
  Administered 2023-01-20: 50 mL

## 2023-01-20 MED ORDER — BISOPROLOL FUMARATE 5 MG PO TABS
5.0000 mg | ORAL_TABLET | Freq: Every day | ORAL | Status: DC
  Administered 2023-01-20: 5 mg via ORAL
  Filled 2023-01-20 (×2): qty 1

## 2023-01-20 MED ORDER — ATORVASTATIN CALCIUM 40 MG PO TABS: 40.0000 mg | ORAL_TABLET | Freq: Every day | ORAL | Status: DC

## 2023-01-20 MED ORDER — HEPARIN SODIUM (PORCINE) 1000 UNIT/ML IJ SOLN
INTRAMUSCULAR | Status: DC | PRN
  Administered 2023-01-20: 4000 [IU] via INTRAVENOUS

## 2023-01-20 MED ORDER — SODIUM CHLORIDE 0.9 % WEIGHT BASED INFUSION
1.0000 mL/kg/h | INTRAVENOUS | Status: AC
  Administered 2023-01-20: 1 mL/kg/h via INTRAVENOUS

## 2023-01-20 MED ORDER — SODIUM CHLORIDE 0.9 % IV SOLN: 250.0000 mL | INTRAVENOUS | Status: DC | PRN

## 2023-01-20 MED ORDER — FENTANYL CITRATE (PF) 100 MCG/2ML IJ SOLN
INTRAMUSCULAR | Status: AC
  Filled 2023-01-20: qty 2

## 2023-01-20 MED ORDER — SODIUM CHLORIDE 0.9% FLUSH: 3.0000 mL | INTRAVENOUS | Status: DC | PRN

## 2023-01-20 MED ORDER — HEPARIN SODIUM (PORCINE) 1000 UNIT/ML IJ SOLN
INTRAMUSCULAR | Status: AC
  Filled 2023-01-20: qty 10

## 2023-01-20 MED ORDER — SODIUM CHLORIDE 0.9% FLUSH
3.0000 mL | Freq: Two times a day (BID) | INTRAVENOUS | Status: DC
  Administered 2023-01-20 – 2023-01-22 (×5): 3 mL via INTRAVENOUS

## 2023-01-20 MED ORDER — LIDOCAINE HCL (PF) 1 % IJ SOLN
INTRAMUSCULAR | Status: DC | PRN
  Administered 2023-01-20: 2 mL

## 2023-01-20 MED ORDER — VERAPAMIL HCL 2.5 MG/ML IV SOLN
INTRAVENOUS | Status: AC
  Filled 2023-01-20: qty 2

## 2023-01-20 MED ORDER — MIDAZOLAM HCL 2 MG/2ML IJ SOLN
INTRAMUSCULAR | Status: AC
  Filled 2023-01-20: qty 2

## 2023-01-20 MED ORDER — LABETALOL HCL 5 MG/ML IV SOLN: 10.0000 mg | INTRAVENOUS | Status: AC | PRN

## 2023-01-20 MED ORDER — HYDRALAZINE HCL 20 MG/ML IJ SOLN: 10.0000 mg | INTRAMUSCULAR | Status: AC | PRN

## 2023-01-20 MED ORDER — LIDOCAINE HCL (PF) 1 % IJ SOLN
INTRAMUSCULAR | Status: AC
  Filled 2023-01-20: qty 30

## 2023-01-20 MED ORDER — VERAPAMIL HCL 2.5 MG/ML IV SOLN
INTRAVENOUS | Status: DC | PRN
  Administered 2023-01-20: 10 mL via INTRA_ARTERIAL

## 2023-01-20 MED ORDER — ATORVASTATIN CALCIUM 80 MG PO TABS
80.0000 mg | ORAL_TABLET | Freq: Every day | ORAL | Status: DC
  Administered 2023-01-20 – 2023-01-27 (×8): 80 mg via ORAL
  Filled 2023-01-20 (×8): qty 1

## 2023-01-20 MED ORDER — HEPARIN SODIUM (PORCINE) 5000 UNIT/ML IJ SOLN
5000.0000 [IU] | Freq: Three times a day (TID) | INTRAMUSCULAR | Status: DC
  Administered 2023-01-20 – 2023-01-22 (×7): 5000 [IU] via SUBCUTANEOUS
  Filled 2023-01-20 (×7): qty 1

## 2023-01-20 MED ORDER — MIDAZOLAM HCL 2 MG/2ML IJ SOLN
INTRAMUSCULAR | Status: DC | PRN
  Administered 2023-01-20: 1 mg via INTRAVENOUS

## 2023-01-20 MED ORDER — FENTANYL CITRATE (PF) 100 MCG/2ML IJ SOLN
INTRAMUSCULAR | Status: DC | PRN
  Administered 2023-01-20: 25 ug via INTRAVENOUS

## 2023-01-20 SURGICAL SUPPLY — 10 items
CATH 5FR JL3.5 JR4 ANG PIG MP (CATHETERS) IMPLANT
DEVICE RAD COMP TR BAND LRG (VASCULAR PRODUCTS) IMPLANT
GLIDESHEATH SLEND SS 6F .021 (SHEATH) IMPLANT
GUIDEWIRE INQWIRE 1.5J.035X260 (WIRE) IMPLANT
INQWIRE 1.5J .035X260CM (WIRE) ×1
KIT HEART LEFT (KITS) ×1 IMPLANT
MAT PREVALON FULL STRYKER (MISCELLANEOUS) IMPLANT
PACK CARDIAC CATHETERIZATION (CUSTOM PROCEDURE TRAY) ×1 IMPLANT
TRANSDUCER W/STOPCOCK (MISCELLANEOUS) ×1 IMPLANT
TUBING CIL FLEX 10 FLL-RA (TUBING) ×1 IMPLANT

## 2023-01-20 NOTE — TOC Progression Note (Signed)
Transition of Care [facility]) - Progression Note    Patient Details  Name: Matthew [name] MRN: [id number] Date of Birth: Oct 15, 1946  [facility]) CM/SW Contact  Zenon Mayo, RN Phone Number: 01/20/2023, 4:25 PM  Clinical Narrative:    from [facility]ime, indep, s/p cath today, no needs.  TOC following. He goes to the [facility], transfer coordiantors are Donato Heinz and [employer]  [phone] ext [phone].  Aprils with Dorthula Rue states the [place] has been notified.       Expected Discharge Plan and Services                                               Social Determinants of Health (SDOH) Interventions SDOH Screenings   Food Insecurity: No Food Insecurity (01/19/2023)  Housing: Low Risk  (01/19/2023)  Transportation Needs: No Transportation Needs (01/19/2023)  Utilities: Not At Risk (01/19/2023)  Depression (PHQ2-9): Low Risk  (11/08/2019)  Recent Concern: Depression (PHQ2-9) - Medium Risk (10/09/2019)  Tobacco Use: Medium Risk (01/20/2023)    Readmission Risk Interventions     No data to display

## 2023-01-20 NOTE — Consult Note (Addendum)
[address]       [place] [address]             [phone]       PCP is [facility] Referring Provider is Dr. Martinique, MD  Chief Complaint  Patient presents with   Chest Pain, shortness of breath    History of Presenting Illness: This is a 77 year old male with a past medical history of COPD, hypertension, hyperlipidemia, hypothyroidism, DM (type II), PTSD, GERD, peptic ulcer, diverticulitis, Bipolar 1 disorder, anxiety, arthritis, remote tobacco abuse, chronic back pain, and colon cancer who presented on 01/19/2023 with complaints of chest pressure and shortness of breath. EKG showed inferior Q waves but no acute ischemic changes. Initial Troponin I (high sensitivity) was 5 Echo done yesterday showed LVEF 65-70% and no significant valvular disease. Cardiac catheterization done today showed Ramus Intermediate with a 75 stenosis, OM1 with a 90% stenosis, OM2 with a 99% stenosis. Cardiothoracic consultation was obtained for consideration of coronary artery bypass grafting surgery. At the time of my exam, patient was having mild chest pain but no shortness of breath. Vital signs showed HR 86, BP 124/87, 91% (just took off oxygen). He has a Nitro patch on left chest.  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--  s/p  right hemicolectomy 06-30-2014  and chemotherapy complete [id number]   Complication of anesthesia    "I GET REAL COLD AND CAN'T URINATE"---  urinary retention   COPD with emphysema (HCC)    Cough    Diverticulosis of colon    GERD (gastroesophageal reflux disease)    History of adenomatous polyp of colon    History of bleeding peptic ulcer    2000   History of diverticulitis of colon    History of panic attacks    Hyperlipidemia     Osteoporosis    PTSD (post-traumatic stress disorder)    [place]   Rectus diastasis    Wears dentures    upper    Past Surgical History: Past Surgical History:  Procedure Laterality Date   ANTERIOR CERVICAL DECOMP/DISCECTOMY FUSION  04-18-2003   C5 -- 6   APPENDECTOMY  yrs ago   BACK SURGERY  [id number]   lumbar lam and fusions   CIRCUMCISION  03-05-2005   COLONOSCOPY  [id number]   LAPAROSCOPIC PARTIAL COLECTOMY N/A 06/30/2014   Procedure: LAPAROSCOPIC right COLECTOMY;  Surgeon: [name], MD;  Location: [facility];  Service: General;  Laterality: N/A;   LEFT HEART CATH AND CORONARY ANGIOGRAPHY N/A 01/20/2023   Procedure: LEFT HEART CATH AND CORONARY ANGIOGRAPHY;  Surgeon: Martinique, Peter M, MD;  Location: [facility];  Service: Cardiovascular;  Laterality: N/A;   PORT-A-CATH REMOVAL Left 10/11/2015   Procedure: REMOVAL PORT-A-CATH;  Surgeon: [name], MD;  Location: [facility];  Service: General;  Laterality: Left;   PORTACATH PLACEMENT Left 07/31/2014   Procedure: INSERTION PORT-A-CATH;  Surgeon: Stark Klein, MD;  Location: [facility];  Service: General;  Laterality: Left;   UPPER GI ENDOSCOPY  2015    Family History: Family History  Problem Relation Age of Onset   Heart disease, MI, died in OR Mother   Father deceased at age 57, MVA  He has 2 sisters-neither have heart disease. One sister is deceased  Social History Social History   Tobacco Use   Smoking status: Former    Packs/day: 1.00    Years: 30.00    Total pack years: 30.00    Types: Cigarettes    Quit date: 06/23/2004    Years since quitting: 18.5   Smokeless tobacco: Never  Substance Use Topics   Alcohol use: No   Drug use: No    Current Facility-Administered Medications  Medication Dose Route Frequency Provider Last Rate Last Admin   0.9 %  sodium chloride infusion  250 mL Intravenous PRN Barton Dubois, MD       0.9 %  sodium chloride infusion  250 mL  Intravenous PRN Martinique, Peter M, MD       0.9% sodium chloride infusion  1 mL/kg/hr Intravenous Continuous Martinique, Peter M, MD 81.6 mL/hr at 01/20/23 0946 1 mL/kg/hr at 01/20/23 0946   acetaminophen (TYLENOL) tablet 650 mg  650 mg Oral Q6H PRN Barton Dubois, MD   650 mg at 01/20/23 5170   Or   acetaminophen (TYLENOL) suppository 650 mg  650 mg Rectal Q6H PRN Barton Dubois, MD       arformoterol [facility]) nebulizer solution 15 mcg  15 mcg Nebulization BID Barton Dubois, MD   15 mcg at 01/19/23 1059   aspirin EC tablet 81 mg  81 mg Oral Daily Martinique, Peter M, MD   81 mg at 01/19/23 1456   atorvastatin (LIPITOR) tablet 40 mg  40 mg Oral QHS Early Osmond, MD       bisoprolol (ZEBETA) tablet 5 mg  5 mg Oral Daily Martinique, Peter M, MD   5 mg at 01/20/23 0943   budesonide (PULMICORT) nebulizer solution 0.5 mg  0.5 mg Nebulization BID Barton Dubois, MD   0.5 mg at 01/19/23 1059   citalopram (CELEXA) tablet 40 mg  40 mg Oral QHS Barton Dubois, MD   40 mg at 01/19/23 2137   cyanocobalamin (VITAMIN B12) tablet 1,000 mcg  1,000 mcg Oral Daily Barton Dubois, MD   1,000 mcg at 01/20/23 0805   dextromethorphan-guaiFENesin (Logan DM) 30-600 MG per 12 hr tablet 1 tablet  1 tablet Oral BID Barton Dubois, MD   1 tablet at 01/20/23 0806   doxycycline (VIBRA-TABS) tablet 100 mg  100 mg Oral Q12H Barton Dubois, MD   100 mg at 01/20/23 0174   gabapentin (NEURONTIN) capsule 300 mg  300 mg Oral TID Barton Dubois, MD   300 mg at 01/20/23 0806   heparin injection 5,000 Units  5,000 Units Subcutaneous Q8H Martinique, Peter M, MD       hydrALAZINE (APRESOLINE) injection 10 mg  10 mg Intravenous Q20 Min PRN Martinique, Peter M, MD       HYDROmorphone (DILAUDID) injection 0.5 mg  0.5 mg Intravenous Q3H PRN Barton Dubois, MD   0.5 mg at 01/20/23 9449   insulin aspart (novoLOG) injection 0-15 Units  0-15 Units Subcutaneous TID [facility] Barton Dubois, MD   5 Units at 01/20/23 6759   insulin aspart (novoLOG) injection 0-5 Units   0-5 Units Subcutaneous QHS Barton Dubois, MD   3 Units at 01/19/23 2205   insulin glargine-yfgn (SEMGLEE) injection 15 Units  15 Units Subcutaneous QHS Barton Dubois, MD   15 Units at 01/19/23 2300   labetalol (NORMODYNE) injection 10 mg  10 mg Intravenous Q10 min PRN Martinique, Peter M, MD       levalbuterol Penne Lash) nebulizer solution  0.63 mg  0.63 mg Nebulization Q8H PRN Barton Dubois, MD       levothyroxine (SYNTHROID) tablet 50 mcg  50 mcg Oral Q0600 Barton Dubois, MD   50 mcg at 01/20/23 0456   methylPREDNISolone sodium succinate (SOLU-MEDROL) 40 mg/mL injection 40 mg  40 mg Intravenous Q12H Barton Dubois, MD   40 mg at 01/20/23 6063   nitroGLYCERIN (NITRODUR - Dosed in mg/24 hr) patch 0.4 mg  0.4 mg Transdermal Daily Barton Dubois, MD   0.4 mg at 01/20/23 0930   nitroGLYCERIN (NITROSTAT) SL tablet 0.4 mg  0.4 mg Sublingual Q5 min PRN Barton Dubois, MD   0.4 mg at 01/19/23 2333   ondansetron (ZOFRAN) tablet 4 mg  4 mg Oral Q6H PRN Barton Dubois, MD       Or   ondansetron [facility]) injection 4 mg  4 mg Intravenous Q6H PRN Barton Dubois, MD       pantoprazole (PROTONIX) EC tablet 40 mg  40 mg Oral Daily Barton Dubois, MD   40 mg at 01/20/23 0806   QUEtiapine (SEROQUEL) tablet 400 mg  400 mg Oral QHS Barton Dubois, MD   400 mg at 01/19/23 2225   sodium chloride flush (NS) 0.9 % injection 3 mL  3 mL Intravenous Q12H Barton Dubois, MD   3 mL at 01/20/23 0926   sodium chloride flush (NS) 0.9 % injection 3 mL  3 mL Intravenous PRN Barton Dubois, MD   3 mL at 01/19/23 2139   sodium chloride flush (NS) 0.9 % injection 3 mL  3 mL Intravenous Q12H Martinique, Peter M, MD   3 mL at 01/20/23 0929   sodium chloride flush (NS) 0.9 % injection 3 mL  3 mL Intravenous Q12H Martinique, Peter M, MD       sodium chloride flush (NS) 0.9 % injection 3 mL  3 mL Intravenous PRN Martinique, Peter M, MD       tamsulosin [facility] capsule 0.4 mg  0.4 mg Oral BID Barton Dubois, MD   0.4 mg at 01/20/23 0160     Allergies: Allergies  Allergen Reactions   Bee Venom Anaphylaxis   Ibuprofen Shortness Of Breath and Swelling    Spoke with patient 01/19/23 he assures me no allergy to aspirin, only other NSAIDs   Naproxen Anaphylaxis and Swelling   Prednisone     Review of Systems: Cardiac Review of Systems: Y or N  Chest Pain [ Y ] Resting SOB [ N ] Exertional SOB [ Y ]  Pedal Edema [ N ] Syncope [ N] Presyncope [ N ]  General Review of Systems: [Y] = yes N'[ ]'$ =no  Constitional: nausea Aqua.Slicker ]; night sweats Aqua.Slicker ]; fever Aqua.Slicker ]; or chills Aqua.Slicker ];  Eye : Amaurosis fugax[ N ];  Resp: cough [ N]; hemoptysis[ N];  GI:  vomiting[ N];  melena[N ]; hematochezia [ N]; heartburn[ Y];  GU: hematuria[ N];  Musculosketetal:  joint pain[ Y]; back pain[ Y ];  Heme/Lymph: anemia[ Y];  Neuro: TIA[N ]; stroke[ N];seizures[ N];difficulty walking'[ ]'$ ;  Psych: anxiety[ Y];  Endocrine: diabetes[Y ]; thyroid dysfunction[Y ];     BP 129/86   Pulse 96   Temp 97.6 F (36.4 C) (Oral)   Resp 19   Ht '5\' 9"'$  (1.753 m)   Wt 81.6 kg   SpO2 90%   BMI 26.57 kg/m   Physical Exam: General appearance: alert, cooperative, and no distress Neurologic: intact Heart: RRR, no murmur Lungs: Clear to auscultation bilaterally Abdomen:  soft, non-tender; bowel sounds normal; no masses,  no organomegaly Extremities: No LE edema, feet warm bilaterally   Diagnostic Studies and Lab Results: Results for orders placed or performed during the hospital encounter of 01/19/23 (from the past 48 hour(s))  CBC with Differential     Status: Abnormal   Collection Time: 01/19/23  2:40 AM  Result Value Ref Range   WBC 14.2 (H) 4.0 - 10.5 K/uL   RBC 4.36 4.22 - 5.81 MIL/uL   Hemoglobin 14.3 13.0 - 17.0 g/dL   HCT 42.7 39.0 - 52.0 %   MCV 97.9 80.0 - 100.0 fL   MCH 32.8 26.0 - 34.0 pg   MCHC 33.5 30.0 - 36.0 g/dL   RDW 13.7 11.5 - 15.5 %   Platelets 174 150 - 400 K/uL   nRBC 0.0 0.0 - 0.2 %   Neutrophils Relative % 80 %   Neutro Abs 11.2  (H) 1.7 - 7.7 K/uL   Lymphocytes Relative 12 %   Lymphs Abs 1.6 0.7 - 4.0 K/uL   Monocytes Relative 8 %   Monocytes Absolute 1.1 (H) 0.1 - 1.0 K/uL   Eosinophils Relative 0 %   Eosinophils Absolute 0.1 0.0 - 0.5 K/uL   Basophils Relative 0 %   Basophils Absolute 0.1 0.0 - 0.1 K/uL   Immature Granulocytes 0 %   Abs Immature Granulocytes 0.06 0.00 - 0.07 K/uL    Comment: Performed at [facility], [address] [place], [place] [address]  Comprehensive metabolic panel     Status: Abnormal   Collection Time: 01/19/23  2:40 AM  Result Value Ref Range   Sodium 135 135 - 145 mmol/L   Potassium 4.2 3.5 - 5.1 mmol/L   Chloride 100 98 - 111 mmol/L   CO2 22 22 - 32 mmol/L   Glucose, Bld 267 (H) 70 - 99 mg/dL    Comment: Glucose reference range applies only to samples taken after fasting for at least 8 hours.   BUN 15 8 - 23 mg/dL   Creatinine, Ser 0.95 0.61 - 1.24 mg/dL   Calcium 9.2 8.9 - 10.3 mg/dL   Total Protein 7.6 6.5 - 8.1 g/dL   Albumin 4.2 3.5 - 5.0 g/dL   AST 75 (H) 15 - 41 U/L   ALT 103 (H) 0 - 44 U/L   Alkaline Phosphatase 94 38 - 126 U/L   Total Bilirubin 1.1 0.3 - 1.2 mg/dL   GFR, Estimated >60 >60 mL/min    Comment: (NOTE) Calculated using the CKD-EPI Creatinine Equation (2021)    Anion gap 13 5 - 15    Comment: Performed at [facility], [address], [place], [place] [address]  Lipase, blood     Status: None   Collection Time: 01/19/23  2:40 AM  Result Value Ref Range   Lipase 30 11 - 51 U/L    Comment: Performed at [facility], [address] [place], [place] [address]  Troponin I (High Sensitivity)     Status: None   Collection Time: 01/19/23  2:40 AM  Result Value Ref Range   Troponin I (High Sensitivity) 5 <18 ng/L    Comment: (NOTE) Elevated high sensitivity troponin I (hsTnI) values and significant  changes across serial measurements may suggest ACS but many other  chronic and acute conditions are known to elevate hsTnI results.  Refer to the "Links"  section for chest pain algorithms and additional  guidance. Performed at [facility], [address], [place], [place] [address]  Brain natriuretic peptide     Status: None   Collection Time: 01/19/23  2:40 AM  Result Value Ref Range   B Natriuretic Peptide 45.0 0.0 - 100.0 pg/mL    Comment: Performed at [facility], [address] [place], [place] [address]  Troponin I (High Sensitivity)     Status: None   Collection Time: 01/19/23  4:40 AM  Result Value Ref Range   Troponin I (High Sensitivity) 5 <18 ng/L    Comment: (NOTE) Elevated high sensitivity troponin I (hsTnI) values and significant  changes across serial measurements may suggest ACS but many other  chronic and acute conditions are known to elevate hsTnI results.  Refer to the "Links" section for chest pain algorithms and additional  guidance. Performed at [facility], [address] [place], [place] [address]   Hepatitis panel, acute     Status: None   Collection Time: 01/19/23  6:57 AM  Result Value Ref Range   Hepatitis B Surface Ag NON REACTIVE NON REACTIVE   HCV Ab NON REACTIVE NON REACTIVE    Comment: (NOTE) Nonreactive HCV antibody screen is consistent with no HCV infections,  unless recent infection is suspected or other evidence exists to indicate HCV infection.     Hep A IgM NON REACTIVE NON REACTIVE   Hep B C IgM NON REACTIVE NON REACTIVE    Comment: Performed at [facility], [place] [address] [place], [place] [address]  Acetaminophen level     Status: Abnormal   Collection Time: 01/19/23  6:58 AM  Result Value Ref Range   Acetaminophen (Tylenol), Serum <10 (L) 10 - 30 ug/mL    Comment: (NOTE) Therapeutic concentrations vary significantly. A range of 10-30 ug/mL  may be an effective concentration for many patients. However, some  are best treated at concentrations outside of this range. Acetaminophen concentrations >150 ug/mL at 4 hours after ingestion  and >50 ug/mL at 12 hours after ingestion  are often associated with  toxic reactions.  Performed at [facility], [address] [place], [place] [address]   CBG monitoring, ED     Status: Abnormal   Collection Time: 01/19/23  9:10 AM  Result Value Ref Range   Glucose-Capillary 231 (H) 70 - 99 mg/dL    Comment: Glucose reference range applies only to samples taken after fasting for at least 8 hours.  CBC     Status: Abnormal   Collection Time: 01/19/23 11:03 AM  Result Value Ref Range   WBC 15.7 (H) 4.0 - 10.5 K/uL   RBC 3.90 (L) 4.22 - 5.81 MIL/uL   Hemoglobin 12.8 (L) 13.0 - 17.0 g/dL   HCT 38.6 (L) 39.0 - 52.0 %   MCV 99.0 80.0 - 100.0 fL   MCH 32.8 26.0 - 34.0 pg   MCHC 33.2 30.0 - 36.0 g/dL   RDW 13.9 11.5 - 15.5 %   Platelets 149 (L) 150 - 400 K/uL   nRBC 0.0 0.0 - 0.2 %    Comment: Performed at [facility], [address], [place], [place] [address]  Creatinine, serum     Status: None   Collection Time: 01/19/23 11:03 AM  Result Value Ref Range   Creatinine, Ser 0.92 0.61 - 1.24 mg/dL   GFR, Estimated >60 >60 mL/min    Comment: (NOTE) Calculated using the CKD-EPI Creatinine Equation (2021) Performed at [facility], [address], [place], [place] [address]   Phosphorus     Status: None   Collection  Time: 01/19/23 11:03 AM  Result Value Ref Range   Phosphorus 3.6 2.5 - 4.6 mg/dL    Comment: Performed at [facility], [address] [place], [place] [address]  Magnesium     Status: None   Collection Time: 01/19/23 11:03 AM  Result Value Ref Range   Magnesium 1.9 1.7 - 2.4 mg/dL    Comment: Performed at [facility], [address], [place], [place] [address]  TSH     Status: None   Collection Time: 01/19/23 11:03 AM  Result Value Ref Range   TSH 2.513 0.350 - 4.500 uIU/mL    Comment: Performed by a 3rd Generation assay with a functional sensitivity of <=0.01 uIU/mL. Performed at [facility], [address] [place], [place] [address]   Hemoglobin A1c     Status: Abnormal   Collection Time: 01/19/23  11:03 AM  Result Value Ref Range   Hgb A1c MFr Bld 6.7 (H) 4.8 - 5.6 %    Comment: (NOTE) Pre diabetes:          5.7%-6.4%  Diabetes:              >6.4%  Glycemic control for   <7.0% adults with diabetes    Mean Plasma Glucose 145.59 mg/dL    Comment: Performed at [facility] [place] [address], [place], [place] [address]  Glucose, capillary     Status: Abnormal   Collection Time: 01/19/23 12:25 PM  Result Value Ref Range   Glucose-Capillary 336 (H) 70 - 99 mg/dL    Comment: Glucose reference range applies only to samples taken after fasting for at least 8 hours.   Comment 1 Notify RN    Comment 2 Document in Chart   Troponin I (High Sensitivity)     Status: None   Collection Time: 01/19/23  3:08 PM  Result Value Ref Range   Troponin I (High Sensitivity) 7 <18 ng/L    Comment: (NOTE) Elevated high sensitivity troponin I (hsTnI) values and significant  changes across serial measurements may suggest ACS but many other  chronic and acute conditions are known to elevate hsTnI results.  Refer to the "Links" section for chest pain algorithms and additional  guidance. Performed at [facility], [address], [place], [place] [address]   Troponin I (High Sensitivity)     Status: None   Collection Time: 01/19/23  5:04 PM  Result Value Ref Range   Troponin I (High Sensitivity) 8 <18 ng/L    Comment: (NOTE) Elevated high sensitivity troponin I (hsTnI) values and significant  changes across serial measurements may suggest ACS but many other  chronic and acute conditions are known to elevate hsTnI results.  Refer to the "Links" section for chest pain algorithms and additional  guidance. Performed at [facility], [address], [place],  [address]   Glucose, capillary     Status: Abnormal   Collection Time: 01/19/23  5:23 PM  Result Value Ref Range   Glucose-Capillary 369 (H) 70 - 99 mg/dL    Comment: Glucose reference range applies only to samples taken after fasting  for at least 8 hours.   Comment 1 Notify RN    Comment 2 Document in Chart   Glucose, capillary     Status: Abnormal   Collection Time: 01/19/23  8:16 PM  Result Value Ref Range   Glucose-Capillary 311 (H) 70 - 99 mg/dL    Comment: Glucose reference range applies only to samples taken after fasting for  at least 8 hours.   Comment 1 Notify RN    Comment 2 Document in Chart   Glucose, capillary     Status: Abnormal   Collection Time: 01/19/23  9:18 PM  Result Value Ref Range   Glucose-Capillary 254 (H) 70 - 99 mg/dL    Comment: Glucose reference range applies only to samples taken after fasting for at least 8 hours.  Heparin level (unfractionated)     Status: None   Collection Time: 01/19/23  9:32 PM  Result Value Ref Range   Heparin Unfractionated 0.59 0.30 - 0.70 IU/mL    Comment: (NOTE) The clinical reportable range upper limit is being lowered to >1.10 to align with the FDA approved guidance for the current laboratory assay.  If heparin results are below expected values, and patient dosage has  been confirmed, suggest follow up testing of antithrombin III levels. Performed at [facility], [place] [address], [place], [place] [address]   Heparin level (unfractionated)     Status: None   Collection Time: 01/19/23 10:28 PM  Result Value Ref Range   Heparin Unfractionated 0.57 0.30 - 0.70 IU/mL    Comment: (NOTE) The clinical reportable range upper limit is being lowered to >1.10 to align with the FDA approved guidance for the current laboratory assay.  If heparin results are below expected values, and patient dosage has  been confirmed, suggest follow up testing of antithrombin III levels. Performed at [facility], [place] [address] [place], [place] [address]   Glucose, capillary     Status: Abnormal   Collection Time: 01/19/23 10:57 PM  Result Value Ref Range   Glucose-Capillary 198 (H) 70 - 99 mg/dL    Comment: Glucose reference range applies only to samples  taken after fasting for at least 8 hours.  Basic metabolic panel     Status: Abnormal   Collection Time: 01/20/23  1:02 AM  Result Value Ref Range   Sodium 136 135 - 145 mmol/L   Potassium 4.7 3.5 - 5.1 mmol/L   Chloride 105 98 - 111 mmol/L   CO2 22 22 - 32 mmol/L   Glucose, Bld 191 (H) 70 - 99 mg/dL    Comment: Glucose reference range applies only to samples taken after fasting for at least 8 hours.   BUN 16 8 - 23 mg/dL   Creatinine, Ser 0.87 0.61 - 1.24 mg/dL   Calcium 9.1 8.9 - 10.3 mg/dL   GFR, Estimated >60 >60 mL/min    Comment: (NOTE) Calculated using the CKD-EPI Creatinine Equation (2021)    Anion gap 9 5 - 15    Comment: Performed at [facility] [address] [place], [place] [address]  CBC     Status: Abnormal   Collection Time: 01/20/23  1:02 AM  Result Value Ref Range   WBC 16.0 (H) 4.0 - 10.5 K/uL   RBC 3.74 (L) 4.22 - 5.81 MIL/uL   Hemoglobin 12.2 (L) 13.0 - 17.0 g/dL   HCT 37.2 (L) 39.0 - 52.0 %   MCV 99.5 80.0 - 100.0 fL   MCH 32.6 26.0 - 34.0 pg   MCHC 32.8 30.0 - 36.0 g/dL   RDW 14.0 11.5 - 15.5 %   Platelets 139 (L) 150 - 400 K/uL   nRBC 0.0 0.0 - 0.2 %    Comment: Performed at [facility], [place] [address], [place], [place] [address]  Lipid panel     Status: None  Collection Time: 01/20/23  1:02 AM  Result Value Ref Range   Cholesterol 123 0 - 200 mg/dL   Triglycerides 89 <150 mg/dL   HDL 51 >40 mg/dL   Total CHOL/HDL Ratio 2.4 RATIO   VLDL 18 0 - 40 mg/dL   LDL Cholesterol 54 0 - 99 mg/dL    Comment:        Total Cholesterol/HDL:CHD Risk Coronary Heart Disease Risk Table                     Men   Women  1/2 Average Risk   3.4   3.3  Average Risk       5.0   4.4  2 X Average Risk   9.6   7.1  3 X Average Risk  23.4   11.0        Use the calculated Patient Ratio above and the CHD Risk Table to determine the patient's CHD Risk.        ATP III CLASSIFICATION (LDL):  <100     mg/dL   Optimal  100-129  mg/dL   Near or Above                     Optimal  130-159  mg/dL   Borderline  160-189  mg/dL   High  >190     mg/dL   Very High Performed at [facility] [address] [place], [place] [address]   Heparin level (unfractionated)     Status: None   Collection Time: 01/20/23  1:02 AM  Result Value Ref Range   Heparin Unfractionated 0.44 0.30 - 0.70 IU/mL    Comment: (NOTE) The clinical reportable range upper limit is being lowered to >1.10 to align with the FDA approved guidance for the current laboratory assay.  If heparin results are below expected values, and patient dosage has  been confirmed, suggest follow up testing of antithrombin III levels. Performed at [facility], [place] [address] [place], [place] [address]   Glucose, capillary     Status: Abnormal   Collection Time: 01/20/23  6:23 AM  Result Value Ref Range   Glucose-Capillary 235 (H) 70 - 99 mg/dL    Comment: Glucose reference range applies only to samples taken after fasting for at least 8 hours.   CARDIAC CATHETERIZATION  Result Date: 01/20/2023   Prox RCA lesion is 45% stenosed.   Prox LAD lesion is 35% stenosed.   2nd Diag lesion is 50% stenosed.   Mid LAD lesion is 85% stenosed.   Ramus lesion is 75% stenosed.   1st Mrg lesion is 90% stenosed.   2nd Mrg lesion is 99% stenosed.   LV end diastolic pressure is mildly elevated. Complex 2 vessel obstructive CAD with heavily calcified vessels. Mildly elevated LVEDP 16 mm Hg Plan; review with heart team approach. Will consult CT surgery. If not felt to be a candidate for surgery could consider PCI with atherectomy of LAD/OM. Need to initiate guideline directed medical therapy.    Diagnostic Dominance: Right    ECHOCARDIOGRAM COMPLETE  Implants  sult Date: 01/19/2023    ECHOCARDIOGRAM REPORT   Patient Name:   ANQUAN AZZARELLO Date of Exam: 01/19/2023 Medical Rec #:  [id number]       Height:       69.0 in Accession #:    [id number]      Weight:       182.9 lb Date of  Birth:  [birth year]         BSA:          1.989 m Patient Age:    33 years        BP:           108/75 mmHg Patient Gender: M               HR:           97 bpm. Exam Location:  [facility] Procedure: 2D Echo, Cardiac Doppler and Color Doppler Indications:    Colon cancer ([place]) (From Hx)  History:        Patient has no prior history of Echocardiogram examinations.                 COPD; Risk Factors:Diabetes and Dyslipidemia. Acute respiratory                 failure with hypoxia ([place]), Colon cancer ([place]) (From Hx).  Sonographer:    Alvino Chapel RCS Referring Phys: [place]  1. Left ventricular ejection fraction, by estimation, is 65 to 70%. The left ventricle has normal function. The left ventricle has no regional wall motion abnormalities. There is mild left ventricular hypertrophy. Left ventricular diastolic parameters are indeterminate. Elevated left atrial pressure.  2. Right ventricular systolic function is normal. The right ventricular size is normal.  3. There is no evidence of cardiac tamponade.  4. The mitral valve is normal in structure. No evidence of mitral valve regurgitation. No evidence of mitral stenosis.  5. The aortic valve is tricuspid. Aortic valve regurgitation is not visualized. No aortic stenosis is present.  6. The inferior vena cava is normal in size with greater than 50% respiratory variability, suggesting right atrial pressure of 3 mmHg. FINDINGS  Left Ventricle: Left ventricular ejection fraction, by estimation, is 65 to 70%. The left ventricle has normal function. The left ventricle has no regional wall motion abnormalities. Definity contrast agent was given IV to delineate the left ventricular  endocardial borders. The left ventricular internal cavity size was normal in size. There is mild left ventricular hypertrophy. Left ventricular diastolic parameters are indeterminate. Elevated left atrial pressure. Right Ventricle: The right ventricular size is normal. Right vetricular wall  thickness was not well visualized. Right ventricular systolic function is normal. Left Atrium: Left atrial size was normal in size. Right Atrium: Right atrial size was normal in size. Pericardium: Trivial pericardial effusion is present. The pericardial effusion is circumferential. There is no evidence of cardiac tamponade. Mitral Valve: The mitral valve is normal in structure. No evidence of mitral valve regurgitation. No evidence of mitral valve stenosis. Tricuspid Valve: The tricuspid valve is normal in structure. Tricuspid valve regurgitation is not demonstrated. No evidence of tricuspid stenosis. Aortic Valve: The aortic valve is tricuspid. Aortic valve regurgitation is not visualized. No aortic stenosis is present. Aortic valve mean gradient measures 3.7 mmHg. Aortic valve peak gradient measures 7.3 mmHg. Aortic valve area, by VTI measures 2.47 cm. Pulmonic Valve: The pulmonic valve was not well visualized. Pulmonic valve regurgitation is not visualized. No evidence of pulmonic stenosis. Aorta: The aortic root is normal in size and structure. Venous: The inferior vena cava is normal in size with greater than 50% respiratory variability, suggesting right atrial pressure of 3 mmHg. IAS/Shunts: No atrial level shunt detected by color flow Doppler.  LEFT VENTRICLE PLAX 2D LVIDd:         4.70 cm   Diastology LVIDs:  2.60 cm   LV e' medial:    5.66 cm/s LV PW:         1.10 cm   LV E/e' medial:  16.7 LV IVS:        1.10 cm   LV e' lateral:   6.09 cm/s LVOT diam:     2.10 cm   LV E/e' lateral: 15.5 LV SV:         52 LV SV Index:   26 LVOT Area:     3.46 cm  RIGHT VENTRICLE RV S prime:     14.10 cm/s TAPSE (M-mode): 1.6 cm LEFT ATRIUM           Index        RIGHT ATRIUM           Index LA diam:      3.80 cm 1.91 cm/m   RA Area:     13.60 cm LA Vol (A2C): 53.8 ml 27.05 ml/m  RA Volume:   35.90 ml  18.05 ml/m LA Vol (A4C): 54.0 ml 27.15 ml/m  AORTIC VALVE AV Area (Vmax):    2.09 cm AV Area (Vmean):   2.30  cm AV Area (VTI):     2.47 cm AV Vmax:           135.12 cm/s AV Vmean:          90.643 cm/s AV VTI:            0.211 m AV Peak Grad:      7.3 mmHg AV Mean Grad:      3.7 mmHg LVOT Vmax:         81.60 cm/s LVOT Vmean:        60.100 cm/s LVOT VTI:          0.150 m LVOT/AV VTI ratio: 0.71  AORTA Ao Root diam: 3.80 cm MITRAL VALVE MV Area (PHT): 3.27 cm    SHUNTS MV Decel Time: 232 msec    Systemic VTI:  0.15 m MV E velocity: 94.30 cm/s  Systemic Diam: 2.10 cm MV A velocity: 94.70 cm/s MV E/A ratio:  1.00 Carlyle Dolly MD Electronically signed by Carlyle Dolly MD Signature Date/Time: 01/19/2023/1:15:17 PM    Final    US Abdomen Limited RUQ (LIVER/GB)  Result Date: 01/19/2023 CLINICAL DATA:  Abdominal pain EXAM: ULTRASOUND ABDOMEN LIMITED RIGHT UPPER QUADRANT COMPARISON:  CT chest abdomen pelvis 01/19/2023 FINDINGS: Gallbladder: No gallstones or wall thickening visualized. No sonographic Murphy sign noted by sonographer. Common bile duct: Diameter: 4.1 mm Liver: No focal hepatic mass. Heterogeneous hepatic echogenicity with overall increased echogenicity concerning for hepatocellular disease. Portal vein is patent on color Doppler imaging with normal direction of blood flow towards the liver. Other: None. IMPRESSION: 1. No cholelithiasis or sonographic evidence of acute cholecystitis. 2. Heterogeneous hepatic echogenicity with overall increased echogenicity concerning for hepatocellular disease. Electronically Signed   By: Kathreen Devoid M.D.   On: 01/19/2023 08:22   CT Angio Chest/Abd/Pel for Dissection W and/or Wo Contrast  Result Date: 01/19/2023 CLINICAL DATA:  Chest pain and pressure EXAM: CT ANGIOGRAPHY CHEST, ABDOMEN AND PELVIS TECHNIQUE: Non-contrast CT of the chest was initially obtained. Multidetector CT imaging through the chest, abdomen and pelvis was performed using the standard protocol during bolus administration of intravenous contrast. Multiplanar reconstructed images and MIPs were obtained and  reviewed to evaluate the vascular anatomy. RADIATION DOSE REDUCTION: This exam was performed according to the departmental dose-optimization program which includes automated exposure control, adjustment of the  mA and/or kV according to patient size and/or use of iterative reconstruction technique. CONTRAST:  142m OMNIPAQUE IOHEXOL 350 MG/ML SOLN COMPARISON:  08/26/2016 abdominal CT FINDINGS: CTA CHEST FINDINGS Cardiovascular: Preferential opacification of the thoracic aorta. No evidence of thoracic aortic aneurysm or dissection. Normal heart size. Trace pericardial effusion or thickening. Extensive atheromatous calcification of the aorta and coronaries. Mediastinum/Nodes: No hematoma or adenopathy Lungs/Pleura: Centrilobular emphysema. There is also subpleural lower lobe fibrotic changes with honeycombing. Musculoskeletal: Diffuse bridging thoracic osteophytes. Review of the MIP images confirms the above findings. CTA ABDOMEN AND PELVIS FINDINGS VASCULAR Aorta: Extensive atheromatous calcification of the aorta. Infrarenal fusiform aneurysm measuring 2.9 cm, 1.5 times the proximal normal segment. Celiac: Atheromatous plaque at the ostium. No branch vessel beading occlusion, or aneurysm. SMA: Replaced hepatic artery to the SMA. Atheromatous plaque proximally. No acute finding or beading. Renals: Atheromatous plaque at the proximal renal arteries without flow reducing stenosis. Negative for aneurysm or dissection. IMA: Patent Inflow: Scattered atheromatous plaque.  No dissection or aneurysm Veins: Unremarkable in the arterial phase Review of the MIP images confirms the above findings. NON-VASCULAR Lower chest:  No contributory findings. Hepatobiliary: No focal liver abnormality.Cholelithiasis. No evidence of biliary inflammation Pancreas: Fat infiltration.  No acute finding Spleen: Unremarkable. Adrenals/Urinary Tract: Negative adrenals. No hydronephrosis or stone. Bilateral renal cysts, larger at the left interpolar  kidney measuring 6.3 cm. Both appear simple with no follow-up imaging recommended. Unremarkable bladder. Stomach/Bowel: Proximal colectomy. No visible bowel inflammation. Sigmoid diverticulosis. Large third portion duodenum diverticulum but uncomplicated appearing Lymphatic: No mass or adenopathy. Reproductive:No acute finding Other: No ascites or pneumoperitoneum. Musculoskeletal: Remote L1 and L3 endplate fractures. Generalized degeneration and osteopenia. Review of the MIP images confirms the above findings. IMPRESSION: 1. No evidence of acute aortic syndrome. 2. Trace pericardial effusion or thickening since 2017. 3. 2.9 cm infrarenal aortic aneurysm. Recommend follow-up ultrasound every 5 years. This recommendation follows ACR consensus guidelines: White Paper of the ACR Incidental Findings Committee II on Vascular Findings. J Am Coll Radiol 2013;; [id number] 4. Aortic Atherosclerosis (ICD10-I70.0) and Emphysema (ICD10-J43.9). Coronary atherosclerosis. 5. Colonic diverticulosis, cholelithiasis, and multilevel spinal ankylosis. Electronically Signed   By: JJorje GuildM.D.   On: 01/19/2023 04:14   DG Chest Portable 1 View  Result Date: 01/19/2023 CLINICAL DATA:  Evaluate for pneumothorax EXAM: PORTABLE CHEST 1 VIEW COMPARISON:  04/26/2021 FINDINGS: Heart and mediastinal contours are within normal limits. No focal opacities or effusions. No acute bony abnormality. Aortic atherosclerosis. No pneumothorax. IMPRESSION: No active cardiopulmonary disease. Electronically Signed   By: KRolm BaptiseM.D.   On: 01/19/2023 03:16    Impression and Plan: Coronary artery disease-Preserved LVEF 65-70% with complex two vessel coronary artery disease. Has Nitro patch. Heparin drip to be restarted per cardiology. Appears he would benefit from coronary artery bypass grafting surgery. Obtain carotid [place] P[place] 2. History of diabetes mellitus-HGA1C 6.7.On Jardiance 25 mg daily, Glipizide 10 mg daily prior to admission. 3.  History of GERD-on Omeprazole prior to admission History of hyperlipidemia-started on Atorvastatin 40 mg daily 4. History of COPD-on Pulmicort and Brovana. He is NOT oxygen dependent 5. History of hypothyroidism-continue Levothyroxine 50 mcg daily 6. History of hypertension-started on Bispropolol  Donielle M ZimmermanPA-C 01/20/2023,10:29 AM   Chart reviewed, patient examined, agree with above. This 77year old gentleman with multiple cardiac risk factors as noted above reports a several month history of exertional fatigue and shortness of breath and then developed substernal chest discomfort last Friday which she initially thought was heartburn.  It persisted over the weekend and he presented to [facility] yesterday with multiple low troponins of 5, 5, 7, and 8.  Electrocardiogram showed inferior Q waves but no ischemic changes.  Given his risk factors, coronary calcifications, and ongoing chest discomfort he was transferred to [facility] and underwent catheterization today showing severe multivessel coronary disease with high-grade LAD and left circumflex system stenoses.  The RCA had mild to moderate proximal stenosis of 45% that did not appear to be flow-limiting.  Ejection fraction was normal by echocardiogram.  His vessels are heavily calcified.  CTA of the chest, abdomen, and pelvis showed the ascending aorta to be free of calcified plaque.  There was calcified plaque in the aortic arch.  I agree that coronary bypass graft surgery is the best treatment for this patient.I discussed the operative procedure with the patient and family including alternatives, benefits and risks; including but not limited to bleeding, blood transfusion, infection, stroke, myocardial infarction, graft failure, heart block requiring a permanent pacemaker, organ dysfunction, and death.  Diana Eves understands and agrees to proceed.  We will schedule surgery for Friday morning.  He should remain hospitalized  until surgery.

## 2023-01-20 NOTE — Inpatient Diabetes Management (Signed)
Inpatient Diabetes Program Recommendations  AACE/ADA: New Consensus Statement on Inpatient Glycemic Control (2015)  Target Ranges:  Prepandial:   less than 140 mg/dL      Peak postprandial:   less than 180 mg/dL (1-2 hours)      Critically ill patients:  140 - 180 mg/dL   Lab Results  Component Value Date   GLUCAP 235 (H) 01/20/2023   HGBA1C 6.7 (H) 01/19/2023    Review of Glycemic Control  Latest Reference Range & Units 01/19/23 21:18 01/19/23 22:57 01/20/23 06:23  Glucose-Capillary 70 - 99 mg/dL 254 (H) 198 (H) 235 (H)  (H): Data is abnormally high Diabetes history: Type 2 DM Outpatient Diabetes medications: Glipizide 10 mg BID, Jardiance 25 mg QD Current orders for Inpatient glycemic control: Semglee 15 units QHS, Novolog 0-15 units TID & HS Solumedrol 125 mg x 1, then taper to 40 mg BID  Inpatient Diabetes Program Recommendations:    Consider adding Novolog 4 units TID (Assuming patient consuming >50% of meals)   Thanks, Bronson Curb, MSN, RNC-OB Diabetes Coordinator [phone] (8a-5p)

## 2023-01-20 NOTE — Progress Notes (Signed)
ANTICOAGULATION CONSULT NOTE - Initial Consult  Pharmacy Consult for Heparin Indication: chest pain/ACS  Allergies  Allergen Reactions   Bee Venom Anaphylaxis   Ibuprofen Shortness Of Breath and Swelling    Spoke with patient 01/19/23 he assures me no allergy to aspirin, only other NSAIDs   Naproxen Anaphylaxis and Swelling   Prednisone     Patient Measurements: Height: '5\' 9"'$  (175.3 cm) Weight: 81.6 kg (179 lb 14.3 oz) IBW/kg (Calculated) : 70.7 HEPARIN DW (KG): 81.6   Vital Signs: Temp: 97.7 F (36.5 C) (02/06 0439) Temp Source: Oral (02/06 0439) BP: 113/80 (02/06 0439) Pulse Rate: 103 (02/06 0439)  Labs: Recent Labs    01/19/23 0240 01/19/23 0440 01/19/23 1103 01/19/23 1508 01/19/23 1704 01/19/23 2228 01/20/23 0102  HGB 14.3  --  12.8*  --   --   --  12.2*  HCT 42.7  --  38.6*  --   --   --  37.2*  PLT 174  --  149*  --   --   --  139*  HEPARINUNFRC  --   --   --   --   --  0.57 0.44  CREATININE 0.95  --  0.92  --   --   --  0.87  TROPONINIHS 5 5  --  7 8  --   --      Estimated Creatinine Clearance: 74.5 mL/min (by C-G formula based on SCr of 0.87 mg/dL).   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--  s/p  right hemicolectomy 06-30-2014  and chemotherapy complete [phone]   Complication of anesthesia    "I GET REAL COLD AND CAN'T URINATE"---  urinary retention   COPD with emphysema (HCC)    Cough    Diverticulosis of colon    GERD (gastroesophageal reflux disease)    History of adenomatous polyp of colon    History of bleeding peptic ulcer    2000   History of diverticulitis of colon    History of panic attacks    Hyperlipidemia    Osteoporosis    PTSD (post-traumatic stress disorder)    [place]   Rectus diastasis    Wears dentures    upper     Medications:  Medications Prior to Admission  Medication Sig Dispense Refill Last Dose   acetaminophen (TYLENOL) 500 MG tablet Take 1,000 mg by mouth every 8 (eight) hours as needed for moderate pain.   01/18/2023   atorvastatin (LIPITOR) 40 MG tablet Take 40 mg by mouth daily.   Past Week   Cholecalciferol 50 MCG (2000 UT) TABS Take 1 tablet by mouth daily.   01/18/2023   cyclobenzaprine (FLEXERIL) 10 MG tablet Take 1 tablet (10 mg total) by mouth at bedtime. (Patient taking differently: Take 10 mg by mouth 3 (three) times daily.) 20 tablet 0 01/18/2023   EPINEPHrine 0.3 mg/0.3 mL IJ SOAJ injection Inject 0.3 mg into the muscle daily as needed (allergic reaction).   unknown   gabapentin (NEURONTIN) 300 MG capsule Take 600 mg by mouth 3 (three) times daily.   01/18/2023   glipiZIDE (GLUCOTROL) 10 MG tablet Take 1 tablet by mouth 2 (two) times daily.   01/18/2023   JARDIANCE 25 MG TABS tablet Take 25 mg by mouth  daily.   01/18/2023   levothyroxine (SYNTHROID) 50 MCG tablet Take 50 mcg by mouth daily before breakfast.   01/18/2023   lisinopril (PRINIVIL,ZESTRIL) 5 MG tablet Take 5 mg by mouth daily.   01/18/2023   metFORMIN (GLUCOPHAGE) 1000 MG tablet Take 1 tablet by mouth 2 (two) times daily.   01/18/2023   Multiple Vitamin (MULTIVITAMIN) tablet Take 1 tablet by mouth daily.   01/18/2023   naloxone (NARCAN) nasal spray 4 mg/0.1 mL Place into the nose.   unknown   Omega-3 Fatty Acids (FISH OIL) 1000 MG CAPS Take by mouth.   01/18/2023   omeprazole (PRILOSEC) 20 MG capsule Take 20 mg by mouth daily with breakfast.    01/18/2023   oxyCODONE (OXY IR/ROXICODONE) 5 MG immediate release tablet Take 5 mg by mouth See admin instructions. Take 1 and 1/2 tablets every 6 hours as needed for pain   Past Month   QUEtiapine (SEROQUEL) 400 MG tablet Take 400 mg by mouth at bedtime.   01/18/2023   Semaglutide,0.25 or 0.'5MG'$ /DOS, (OZEMPIC, 0.25 OR 0.5 MG/DOSE,) 2 MG/1.5ML SOPN 0.5 mg once a week.   01/17/2023   sildenafil (VIAGRA) 100  MG tablet Take 100 mg by mouth as needed for erectile dysfunction.   unknown   tamsulosin (FLOMAX) 0.4 MG CAPS capsule Take 0.4 mg by mouth daily.   Past Week   vitamin B-12 (CYANOCOBALAMIN) 1000 MCG tablet Take 1,000 mcg by mouth daily.   01/18/2023   acyclovir (ZOVIRAX) 200 MG capsule       albuterol (VENTOLIN HFA) 108 (90 Base) MCG/ACT inhaler INHALE 2 PUFFS BY ORAL INHALATION 4 TIMES A DAY (Patient not taking: Reported on 01/19/2023)   Not Taking   citalopram (CELEXA) 40 MG tablet Take 40 mg by mouth at bedtime.      DM-Phenylephrine-Acetaminophen 10-5-325 MG/15ML LIQD Take by mouth as needed. (Patient not taking: Reported on 01/19/2023)   Not Taking   loperamide (IMODIUM A-D) 2 MG tablet TAKE CAPSULE(S) TABLET(S) BY MOUTH  AS NEEDED FOR LOOSE STOOLS   TAKE TWO TABLETS BY MOUTH INITIALLY AND TAKE ONE TABLET AS NEEDED FOR   DIARRHEA AFTER EACH LOOSE STOOL. MAX OF 4 TABLETS ('8MG'$ ) DAILY. DO NOT USE  FOR MORE  THAN 48 HOURS. FOR LOOSE STOOLS   TAKE TWO TABLETS BY MOUTH INITIALLY AND TAKE ONE TABLET AS NEEDED FOR    DIARRHEA AFTER EACH LOOSE STOOL. MAX OF 4 TABLETS ('8MG'$ ) DAILY. DO NOT USE   FOR MORE  THAN 48 HOURS. (Patient not taking: Reported on 01/19/2023)   Not Taking    Assessment: 77 y.o. male with a hx of DM2, bipolar, COPD  who presented to ED with chest pain . Patient not on any oral anticoagulation. Pharmacy asked to start heparin  Heparin level came back at 0.57. Cont current rate and check confirm in AM.  Addendum  Confirm level came back therapeutic. Cont current rate.   Goal of Therapy:  Heparin level 0.3-0.7 units/ml Monitor platelets by anticoagulation protocol: Yes   Plan:  Cont heparin at 1000 units/hr Check confirm level in AM Daily HL and CBC  Onnie Boer, PharmD, BCIDP, AAHIVP, CPP Infectious Disease Pharmacist 01/20/2023 4:51 AM

## 2023-01-20 NOTE — Interval H&P Note (Signed)
History and Physical Interval Note:  01/20/2023 8:13 AM  Diana Eves  has presented today for surgery, with the diagnosis of nstemi.  The various methods of treatment have been discussed with the patient and family. After consideration of risks, benefits and other options for treatment, the patient has consented to  Procedure(s): LEFT HEART CATH AND CORONARY ANGIOGRAPHY (N/A) as a surgical intervention.  The patient's history has been reviewed, patient examined, no change in status, stable for surgery.  I have reviewed the patient's chart and labs.  Questions were answered to the patient's satisfaction.    Cath Lab Visit (complete for each Cath Lab visit)  Clinical Evaluation Leading to the Procedure:   ACS: Yes.    Non-ACS:    Anginal Classification: CCS III  Anti-ischemic medical therapy: No Therapy  Non-Invasive Test Results: No non-invasive testing performed  Prior CABG: No previous CABG       Collier [facility]ina [facility] 01/20/2023 8:13 AM

## 2023-01-20 NOTE — Progress Notes (Signed)
Rounding Note    Patient Name: Matthew [name] Date of Encounter: 01/20/2023  [place] Cardiologist: Zandra Abts  Subjective   Seen after coronary angiography which demonstrated high calcified MVD (including LAD/Dx bifurcation).  Denies chest pain or dyspnea.  Inpatient Medications    Scheduled Meds:  arformoterol  15 mcg Nebulization BID   aspirin EC  81 mg Oral Daily   atorvastatin  40 mg Oral QHS   bisoprolol  5 mg Oral Daily   budesonide (PULMICORT) nebulizer solution  0.5 mg Nebulization BID   citalopram  40 mg Oral QHS   cyanocobalamin  1,000 mcg Oral Daily   dextromethorphan-guaiFENesin  1 tablet Oral BID   doxycycline  100 mg Oral Q12H   gabapentin  300 mg Oral TID   heparin  5,000 Units Subcutaneous Q8H   insulin aspart  0-15 Units Subcutaneous TID WC   insulin aspart  0-5 Units Subcutaneous QHS   insulin glargine-yfgn  15 Units Subcutaneous QHS   levothyroxine  50 mcg Oral Q0600   methylPREDNISolone (SOLU-MEDROL) injection  40 mg Intravenous Q12H   nitroGLYCERIN  0.4 mg Transdermal Daily   pantoprazole  40 mg Oral Daily   QUEtiapine  400 mg Oral QHS   sodium chloride flush  3 mL Intravenous Q12H   sodium chloride flush  3 mL Intravenous Q12H   sodium chloride flush  3 mL Intravenous Q12H   tamsulosin  0.4 mg Oral BID   Continuous Infusions:  sodium chloride     sodium chloride     sodium chloride 1 mL/kg/hr (01/20/23 0946)   PRN Meds: sodium chloride, sodium chloride, acetaminophen **OR** acetaminophen, hydrALAZINE, HYDROmorphone (DILAUDID) injection, labetalol, levalbuterol, nitroGLYCERIN, ondansetron **OR** ondansetron (ZOFRAN) IV, sodium chloride flush, sodium chloride flush   Vital Signs    Vitals:   01/20/23 0840 01/20/23 0845 01/20/23 0850 01/20/23 0914  BP: (!) 131/93 (!) 125/93 (!) 137/91 129/86  Pulse: 100 94 (!) 0 96  Resp: (!) '21 20  19  '$ Temp:    97.6 F (36.4 C)  TempSrc:    Oral  SpO2: 90% 90% (!) 89% 90%  Weight:       Height:        Intake/Output Summary (Last 24 hours) at 01/20/2023 1056 Last data filed at 01/20/2023 1023 Gross per 24 hour  Intake 537.85 ml  Output 500 ml  Net 37.85 ml      01/19/2023    9:05 PM 01/19/2023   10:41 AM 04/26/2021    2:43 PM  Last 3 Weights  Weight (lbs) 179 lb 14.3 oz 182 lb 14.4 oz 195 lb 15.8 oz  Weight (kg) 81.6 kg 82.963 kg 88.9 kg      Telemetry    NSR - Personally Reviewed  ECG    SR with evolving inferior STE- Personally Reviewed  Physical Exam   GEN: No acute distress.   Neck: No JVD Cardiac: RRR, no murmurs, rubs, or gallops.  Respiratory: Clear to auscultation bilaterally. GI: Soft, nontender, non-distended  MS: No edema; No deformity. Neuro:  Nonfocal  Psych: Normal affect  Ext:  TR band R radial; no edema  Labs    High Sensitivity Troponin:   Recent Labs  Lab 01/19/23 0240 01/19/23 0440 01/19/23 1508 01/19/23 1704  TROPONINIHS '5 5 7 8     '$ Chemistry Recent Labs  Lab 01/19/23 0240 01/19/23 1103 01/20/23 0102  NA 135  --  136  K 4.2  --  4.7  CL 100  --  105  CO2 22  --  22  GLUCOSE 267*  --  191*  BUN 15  --  16  CREATININE 0.95 0.92 0.87  CALCIUM 9.2  --  9.1  MG  --  1.9  --   PROT 7.6  --   --   ALBUMIN 4.2  --   --   AST 75*  --   --   ALT 103*  --   --   ALKPHOS 94  --   --   BILITOT 1.1  --   --   GFRNONAA >60 >60 >60  ANIONGAP 13  --  9    Lipids  Recent Labs  Lab 01/20/23 0102  CHOL 123  TRIG 89  HDL 51  LDLCALC 54  CHOLHDL 2.4    Hematology Recent Labs  Lab 01/19/23 0240 01/19/23 1103 01/20/23 0102  WBC 14.2* 15.7* 16.0*  RBC 4.36 3.90* 3.74*  HGB 14.3 12.8* 12.2*  HCT 42.7 38.6* 37.2*  MCV 97.9 99.0 99.5  MCH 32.8 32.8 32.6  MCHC 33.5 33.2 32.8  RDW 13.7 13.9 14.0  PLT 174 149* 139*   Thyroid  Recent Labs  Lab 01/19/23 1103  TSH 2.513    BNP Recent Labs  Lab 01/19/23 0240  BNP 45.0    DDimer No results for input(s): "DDIMER" in the last 168 hours.   Radiology    CARDIAC  CATHETERIZATION  Result Date: 01/20/2023   Prox RCA lesion is 45% stenosed.   Prox LAD lesion is 35% stenosed.   2nd Diag lesion is 50% stenosed.   Mid LAD lesion is 85% stenosed.   Ramus lesion is 75% stenosed.   1st Mrg lesion is 90% stenosed.   2nd Mrg lesion is 99% stenosed.   LV end diastolic pressure is mildly elevated. Complex 2 vessel obstructive CAD with heavily calcified vessels. Mildly elevated LVEDP 16 mm Hg Plan; review with heart team approach. Will consult CT surgery. If not felt to be a candidate for surgery could consider PCI with atherectomy of LAD/OM. Need to initiate guideline directed medical therapy.   ECHOCARDIOGRAM COMPLETE  Result Date: 01/19/2023    ECHOCARDIOGRAM REPORT   Patient Name:   Matthew [name] Date of Exam: 01/19/2023 Medical Rec #:  [id number]       Height:       69.0 in Accession #:    [id number]      Weight:       182.9 lb Date of Birth:  1946/03/17        BSA:          1.989 m Patient Age:    77 years        BP:           108/75 mmHg Patient Gender: M               HR:           97 bpm. Exam Location:  [facility] Procedure: 2D Echo, Cardiac Doppler and Color Doppler Indications:    Colon cancer ([place]) (From Hx)  History:        Patient has no prior history of Echocardiogram examinations.                 COPD; Risk Factors:Diabetes and Dyslipidemia. Acute respiratory                 failure with hypoxia ([place]), Colon cancer ([place]) (From Hx).  Sonographer:    Alvino Chapel  RCS Referring Phys: [place]  1. Left ventricular ejection fraction, by estimation, is 65 to 70%. The left ventricle has normal function. The left ventricle has no regional wall motion abnormalities. There is mild left ventricular hypertrophy. Left ventricular diastolic parameters are indeterminate. Elevated left atrial pressure.  2. Right ventricular systolic function is normal. The right ventricular size is normal.  3. There is no evidence of cardiac tamponade.  4. The mitral valve is  normal in structure. No evidence of mitral valve regurgitation. No evidence of mitral stenosis.  5. The aortic valve is tricuspid. Aortic valve regurgitation is not visualized. No aortic stenosis is present.  6. The inferior vena cava is normal in size with greater than 50% respiratory variability, suggesting right atrial pressure of 3 mmHg. FINDINGS  Left Ventricle: Left ventricular ejection fraction, by estimation, is 65 to 70%. The left ventricle has normal function. The left ventricle has no regional wall motion abnormalities. Definity contrast agent was given IV to delineate the left ventricular  endocardial borders. The left ventricular internal cavity size was normal in size. There is mild left ventricular hypertrophy. Left ventricular diastolic parameters are indeterminate. Elevated left atrial pressure. Right Ventricle: The right ventricular size is normal. Right vetricular wall thickness was not well visualized. Right ventricular systolic function is normal. Left Atrium: Left atrial size was normal in size. Right Atrium: Right atrial size was normal in size. Pericardium: Trivial pericardial effusion is present. The pericardial effusion is circumferential. There is no evidence of cardiac tamponade. Mitral Valve: The mitral valve is normal in structure. No evidence of mitral valve regurgitation. No evidence of mitral valve stenosis. Tricuspid Valve: The tricuspid valve is normal in structure. Tricuspid valve regurgitation is not demonstrated. No evidence of tricuspid stenosis. Aortic Valve: The aortic valve is tricuspid. Aortic valve regurgitation is not visualized. No aortic stenosis is present. Aortic valve mean gradient measures 3.7 mmHg. Aortic valve peak gradient measures 7.3 mmHg. Aortic valve area, by VTI measures 2.47 cm. Pulmonic Valve: The pulmonic valve was not well visualized. Pulmonic valve regurgitation is not visualized. No evidence of pulmonic stenosis. Aorta: The aortic root is normal in  size and structure. Venous: The inferior vena cava is normal in size with greater than 50% respiratory variability, suggesting right atrial pressure of 3 mmHg. IAS/Shunts: No atrial level shunt detected by color flow Doppler.  LEFT VENTRICLE PLAX 2D LVIDd:         4.70 cm   Diastology LVIDs:         2.60 cm   LV e' medial:    5.66 cm/s LV PW:         1.10 cm   LV E/e' medial:  16.7 LV IVS:        1.10 cm   LV e' lateral:   6.09 cm/s LVOT diam:     2.10 cm   LV E/e' lateral: 15.5 LV SV:         52 LV SV Index:   26 LVOT Area:     3.46 cm  RIGHT VENTRICLE RV S prime:     14.10 cm/s TAPSE (M-mode): 1.6 cm LEFT ATRIUM           Index        RIGHT ATRIUM           Index LA diam:      3.80 cm 1.91 cm/m   RA Area:     13.60 cm LA Vol (A2C): 53.8 ml 27.05  ml/m  RA Volume:   35.90 ml  18.05 ml/m LA Vol (A4C): 54.0 ml 27.15 ml/m  AORTIC VALVE AV Area (Vmax):    2.09 cm AV Area (Vmean):   2.30 cm AV Area (VTI):     2.47 cm AV Vmax:           135.12 cm/s AV Vmean:          90.643 cm/s AV VTI:            0.211 m AV Peak Grad:      7.3 mmHg AV Mean Grad:      3.7 mmHg LVOT Vmax:         81.60 cm/s LVOT Vmean:        60.100 cm/s LVOT VTI:          0.150 m LVOT/AV VTI ratio: 0.71  AORTA Ao Root diam: 3.80 cm MITRAL VALVE MV Area (PHT): 3.27 cm    SHUNTS MV Decel Time: 232 msec    Systemic VTI:  0.15 m MV E velocity: 94.30 cm/s  Systemic Diam: 2.10 cm MV A velocity: 94.70 cm/s MV E/A ratio:  1.00 Carlyle Dolly MD Electronically signed by Carlyle Dolly MD Signature Date/Time: 01/19/2023/1:15:17 PM    Final    US Abdomen Limited RUQ (LIVER/GB)  Result Date: 01/19/2023 CLINICAL DATA:  Abdominal pain EXAM: ULTRASOUND ABDOMEN LIMITED RIGHT UPPER QUADRANT COMPARISON:  CT chest abdomen pelvis 01/19/2023 FINDINGS: Gallbladder: No gallstones or wall thickening visualized. No sonographic Murphy sign noted by sonographer. Common bile duct: Diameter: 4.1 mm Liver: No focal hepatic mass. Heterogeneous hepatic echogenicity with  overall increased echogenicity concerning for hepatocellular disease. Portal vein is patent on color Doppler imaging with normal direction of blood flow towards the liver. Other: None. IMPRESSION: 1. No cholelithiasis or sonographic evidence of acute cholecystitis. 2. Heterogeneous hepatic echogenicity with overall increased echogenicity concerning for hepatocellular disease. Electronically Signed   By: Kathreen Devoid M.D.   On: 01/19/2023 08:22   CT Angio Chest/Abd/Pel for Dissection W and/or Wo Contrast  Result Date: 01/19/2023 CLINICAL DATA:  Chest pain and pressure EXAM: CT ANGIOGRAPHY CHEST, ABDOMEN AND PELVIS TECHNIQUE: Non-contrast CT of the chest was initially obtained. Multidetector CT imaging through the chest, abdomen and pelvis was performed using the standard protocol during bolus administration of intravenous contrast. Multiplanar reconstructed images and MIPs were obtained and reviewed to evaluate the vascular anatomy. RADIATION DOSE REDUCTION: This exam was performed according to the departmental dose-optimization program which includes automated exposure control, adjustment of the mA and/or kV according to patient size and/or use of iterative reconstruction technique. CONTRAST:  180m OMNIPAQUE IOHEXOL 350 MG/ML SOLN COMPARISON:  08/26/2016 abdominal CT FINDINGS: CTA CHEST FINDINGS Cardiovascular: Preferential opacification of the thoracic aorta. No evidence of thoracic aortic aneurysm or dissection. Normal heart size. Trace pericardial effusion or thickening. Extensive atheromatous calcification of the aorta and coronaries. Mediastinum/Nodes: No hematoma or adenopathy Lungs/Pleura: Centrilobular emphysema. There is also subpleural lower lobe fibrotic changes with honeycombing. Musculoskeletal: Diffuse bridging thoracic osteophytes. Review of the MIP images confirms the above findings. CTA ABDOMEN AND PELVIS FINDINGS VASCULAR Aorta: Extensive atheromatous calcification of the aorta. Infrarenal  fusiform aneurysm measuring 2.9 cm, 1.5 times the proximal normal segment. Celiac: Atheromatous plaque at the ostium. No branch vessel beading occlusion, or aneurysm. SMA: Replaced hepatic artery to the SMA. Atheromatous plaque proximally. No acute finding or beading. Renals: Atheromatous plaque at the proximal renal arteries without flow reducing stenosis. Negative for aneurysm or dissection. IMA: Patent Inflow:  Scattered atheromatous plaque.  No dissection or aneurysm Veins: Unremarkable in the arterial phase Review of the MIP images confirms the above findings. NON-VASCULAR Lower chest:  No contributory findings. Hepatobiliary: No focal liver abnormality.Cholelithiasis. No evidence of biliary inflammation Pancreas: Fat infiltration.  No acute finding Spleen: Unremarkable. Adrenals/Urinary Tract: Negative adrenals. No hydronephrosis or stone. Bilateral renal cysts, larger at the left interpolar kidney measuring 6.3 cm. Both appear simple with no follow-up imaging recommended. Unremarkable bladder. Stomach/Bowel: Proximal colectomy. No visible bowel inflammation. Sigmoid diverticulosis. Large third portion duodenum diverticulum but uncomplicated appearing Lymphatic: No mass or adenopathy. Reproductive:No acute finding Other: No ascites or pneumoperitoneum. Musculoskeletal: Remote L1 and L3 endplate fractures. Generalized degeneration and osteopenia. Review of the MIP images confirms the above findings. IMPRESSION: 1. No evidence of acute aortic syndrome. 2. Trace pericardial effusion or thickening since 2017. 3. 2.9 cm infrarenal aortic aneurysm. Recommend follow-up ultrasound every 5 years. This recommendation follows ACR consensus guidelines: White Paper of the ACR Incidental Findings Committee II on Vascular Findings. J Am Coll Radiol 2013; 78:675-449. 4. Aortic Atherosclerosis (ICD10-I70.0) and Emphysema (ICD10-J43.9). Coronary atherosclerosis. 5. Colonic diverticulosis, cholelithiasis, and multilevel spinal  ankylosis. Electronically Signed   By: Jorje Guild M.D.   On: 01/19/2023 04:14   DG Chest Portable 1 View  Result Date: 01/19/2023 CLINICAL DATA:  Evaluate for pneumothorax EXAM: PORTABLE CHEST 1 VIEW COMPARISON:  04/26/2021 FINDINGS: Heart and mediastinal contours are within normal limits. No focal opacities or effusions. No acute bony abnormality. Aortic atherosclerosis. No pneumothorax. IMPRESSION: No active cardiopulmonary disease. Electronically Signed   By: Rolm Baptise M.D.   On: 01/19/2023 03:16    Cardiac Studies   Cath 01/20/23 Complex 2 vessel obstructive CAD with heavily calcified vessels. Mildly elevated LVEDP 16 mm Hg  TTE 01/19/23: Complex 2 vessel obstructive CAD with heavily calcified vessels. Mildly elevated LVEDP 16 mm Hg  Patient Profile     77 y.o. male  with a hx of DM2, bipolar, COPD presents with UA>>MVD  Assessment & Plan     Unstable angina:  Chest pain free now, cont ASA, atorva 80, start Toprol XL 25 QPM. MVD:  Will follow up CTS recommendations T2DM:  Would benefit from SGLT2 inhibitor; consider later HL:  Cont atorvastatin 80  For questions or updates, please contact [place] Please consult [email or web address] for contact info under        Signed, Early Osmond, MD  01/20/2023, 10:56 AM

## 2023-01-20 NOTE — Progress Notes (Signed)
Pt with ongoing chest pain. Vitals WNL. EKG done. Cardiologist notified. No new orders at this time.

## 2023-01-20 NOTE — Progress Notes (Signed)
Mobility Specialist - Progress Note   01/20/23 1333  Mobility  Activity Ambulated with assistance in hallway  Level of Assistance Contact guard assist, steadying assist  Assistive Device Other (Comment) (IV Pole)  Distance Ambulated (ft) 200 ft  Activity Response Tolerated well  Mobility Referral Yes  $Mobility charge 1 Mobility    Pt received in bed agreeable to mobility. No complaints throughout. Left in bed w/ all needs met and call bell in reach.   [place] Specialist Please contact via SecureChat or Rehab office at [phone]

## 2023-01-20 NOTE — Progress Notes (Signed)
  Progress Note   Patient: Matthew [name] TTS:[id number] DOB: [birth year] DOA: 01/19/2023     1 DOS: the patient was seen and examined on 01/20/2023   Brief hospital course:  Assessment and Plan: Chest pain (s/p cardiac cath with MVD) - s/p cardiac cath with multi vessel disease --> CT surgery notified by cardiology  - ASA 81 mg PO daily - Lipitor 80 mg PO daily  - Bisprolol 5 mg PO daily  - Dilaudid 0.5 mg q3 hr PRN    COPD exacerbation  - Brovana 15 mcg bid  - Pulmicort 0.5 mg bid  - Mucinex 600 mg PO bid  - Xopenex q8hr PRN  - Doxycycline 100 mg PO q12  - IV solumedrol 40 mg q12   DM2 - Novolog SS ACHS  - Glargine 15 units sq daily  - Gabapentin 300 mg PO tid   HLD - Statin as above    GERD - Protonix 40 mg PO daily   Depression - Citalopram 40 mg PO daily - Seroquel 400 mg PO daily bedtime     BPH - Flomax 0.4 mg PO bid   Back pain  - Dilaudid as above   Hypothyroidism  - Synthroid 50 mcg PO daily   DVT prophylaxis: Heparin 5000 units sq q8hr      Subjective: Pt seen and examined at the bedside. S/p cardiac cath with noted multivessel disease. CT surgery will be notified by cardiology for further management.  Physical Exam: Vitals:   01/20/23 0840 01/20/23 0845 01/20/23 0850 01/20/23 0914  BP: (!) 131/93 (!) 125/93 (!) 137/91 129/86  Pulse: 100 94 (!) 0 96  Resp: (!) '21 20  19  '$ Temp:    97.6 F (36.4 C)  TempSrc:    Oral  SpO2: 90% 90% (!) 89% 90%  Weight:      Height:       Physical Exam Constitutional:      Appearance: He is well-developed.  HENT:     Head: Normocephalic and atraumatic.  Cardiovascular:     Rate and Rhythm: Normal rate and regular rhythm.  Pulmonary:     Effort: Pulmonary effort is normal.  Abdominal:     Palpations: Abdomen is soft.  Skin:    General: Skin is warm.     Coloration: Skin is not pale.  Neurological:     Mental Status: He is alert and oriented to person, place, and time.  Psychiatric:        Mood and  Affect: Mood normal.    Data Reviewed:   Disposition: Status is: Inpatient  Planned Discharge Destination: Home    Time spent: 35 minutes  Author: Lucienne Minks , MD 01/20/2023 2:42 PM  For on call review [email or web address].

## 2023-01-21 ENCOUNTER — Inpatient Hospital Stay (HOSPITAL_COMMUNITY): Payer: Medicare Other

## 2023-01-21 ENCOUNTER — Inpatient Hospital Stay (HOSPITAL_COMMUNITY): Payer: No Typology Code available for payment source

## 2023-01-21 DIAGNOSIS — I200 Unstable angina: Secondary | ICD-10-CM | POA: Diagnosis not present

## 2023-01-21 DIAGNOSIS — Z01810 Encounter for preprocedural cardiovascular examination: Secondary | ICD-10-CM | POA: Diagnosis not present

## 2023-01-21 LAB — CBC
HCT: 35.7 % — ABNORMAL LOW (ref 39.0–52.0)
Hemoglobin: 11.5 g/dL — ABNORMAL LOW (ref 13.0–17.0)
MCH: 32.8 pg (ref 26.0–34.0)
MCHC: 32.2 g/dL (ref 30.0–36.0)
MCV: 101.7 fL — ABNORMAL HIGH (ref 80.0–100.0)
Platelets: 140 10*3/uL — ABNORMAL LOW (ref 150–400)
RBC: 3.51 MIL/uL — ABNORMAL LOW (ref 4.22–5.81)
RDW: 13.8 % (ref 11.5–15.5)
WBC: 14.5 10*3/uL — ABNORMAL HIGH (ref 4.0–10.5)
nRBC: 0 % (ref 0.0–0.2)

## 2023-01-21 LAB — PULMONARY FUNCTION TEST
FEF 25-75 Pre: 2.39 L/sec
FEF2575-%Pred-Pre: 109 %
FEV1-%Pred-Pre: 86 %
FEV1-Pre: 2.57 L
FEV1FVC-%Pred-Pre: 106 %
FEV6-%Pred-Pre: 85 %
FEV6-Pre: 3.28 L
FEV6FVC-%Pred-Pre: 105 %
FVC-%Pred-Pre: 80 %
FVC-Pre: 3.31 L
Pre FEV1/FVC ratio: 78 %
Pre FEV6/FVC Ratio: 99 %

## 2023-01-21 LAB — BASIC METABOLIC PANEL
Anion gap: 9 (ref 5–15)
BUN: 24 mg/dL — ABNORMAL HIGH (ref 8–23)
CO2: 19 mmol/L — ABNORMAL LOW (ref 22–32)
Calcium: 8.8 mg/dL — ABNORMAL LOW (ref 8.9–10.3)
Chloride: 105 mmol/L (ref 98–111)
Creatinine, Ser: 0.9 mg/dL (ref 0.61–1.24)
GFR, Estimated: >60 mL/min (ref >60)
Glucose, Bld: 226 mg/dL — ABNORMAL HIGH (ref 70–99)
Potassium: 4.8 mmol/L (ref 3.5–5.1)
Sodium: 133 mmol/L — ABNORMAL LOW (ref 135–145)

## 2023-01-21 LAB — GLUCOSE, CAPILLARY
Glucose-Capillary: 241 mg/dL — ABNORMAL HIGH (ref 70–99)
Glucose-Capillary: 231 mg/dL — ABNORMAL HIGH (ref 70–99)
Glucose-Capillary: 404 mg/dL — ABNORMAL HIGH (ref 70–99)
Glucose-Capillary: 253 mg/dL — ABNORMAL HIGH (ref 70–99)

## 2023-01-21 LAB — C-REACTIVE PROTEIN: CRP: 10.6 mg/dL — ABNORMAL HIGH (ref <1.0)

## 2023-01-21 LAB — MAGNESIUM: Magnesium: 2.1 mg/dL (ref 1.7–2.4)

## 2023-01-21 MED ORDER — METOPROLOL SUCCINATE ER 25 MG PO TB24
25.0000 mg | ORAL_TABLET | Freq: Every day | ORAL | Status: DC
  Administered 2023-01-21 – 2023-01-22 (×2): 25 mg via ORAL
  Filled 2023-01-21 (×2): qty 1

## 2023-01-21 MED ORDER — INSULIN GLARGINE-YFGN 100 UNIT/ML ~~LOC~~ SOLN
18.0000 [IU] | Freq: Every day | SUBCUTANEOUS | Status: DC
  Filled 2023-01-21: qty 0.18

## 2023-01-21 MED ORDER — INSULIN GLARGINE-YFGN 100 UNIT/ML ~~LOC~~ SOLN
10.0000 [IU] | Freq: Once | SUBCUTANEOUS | Status: AC
  Administered 2023-01-21: 10 [IU] via SUBCUTANEOUS
  Filled 2023-01-21: qty 0.1

## 2023-01-21 MED ORDER — DIAZEPAM 2 MG PO TABS
2.0000 mg | ORAL_TABLET | Freq: Once | ORAL | Status: AC
  Administered 2023-01-21: 2 mg via ORAL
  Filled 2023-01-21: qty 1

## 2023-01-21 MED ORDER — INSULIN GLARGINE-YFGN 100 UNIT/ML ~~LOC~~ SOLN
16.0000 [IU] | Freq: Two times a day (BID) | SUBCUTANEOUS | Status: DC
  Administered 2023-01-21: 16 [IU] via SUBCUTANEOUS
  Filled 2023-01-21 (×3): qty 0.16

## 2023-01-21 NOTE — Progress Notes (Signed)
Pt verbalized to NT that he has blurred vision and double vision as well. Assessment completed. Per pt, this started on Friday prior to hospital admission, but it got worse on the day of cardiac catheterization, which was 01/20/23.  Neuro assessment unchanged except for slightly different sensation, touch to right side of his body feels slightly more sharp comparing to left side. Pt could not tell if this is new. MD notified, Rapid response RN at the bedside assessing pt.   New order for CT head placed. Pt transported to CT at 2039 by Rapid.

## 2023-01-21 NOTE — Progress Notes (Signed)
CARDIAC REHAB PHASE I   OHS education including OHS handout, OHS booklet, early mobility, IS use, home needs at discharge and sternal precautions reviewed with pt and family. Able to react 2500 today with IS. All questions and concerns addressed. Will continue to follow.      [phone] Vanessa Barbara, RN BSN 01/21/2023 2:17 PM

## 2023-01-21 NOTE — Progress Notes (Addendum)
  Progress Note   Patient: Matthew [name] VZD:[id number] DOB: 12/11/1946 DOA: 01/19/2023     2 DOS: the patient was seen and examined on 01/21/2023   Brief hospital course:  Assessment and Plan: Chest pain (s/p cardiac cath with MVD) - s/p cardiac cath with multi vessel disease --> CT surgery planning for CABG on Fri 01/23/2023  - ASA 81 mg PO daily - Lipitor 80 mg PO daily  - Toprol XL 25 mg PO daily  - Dilaudid 0.5 mg q3 hr PRN    Acute hypoxic respiratory failure, Present on admission (due to COPD)  - Brovana 15 mcg bid  - Pulmicort 0.5 mg bid  - Mucinex 600 mg PO bid  - Xopenex q8hr PRN  - Doxycycline 100 mg PO q12  - IV solumedrol 40 mg q12    Diabetes mellitus type 2 with hyperglycemia (A1c 6.7) - Novolog SS ACHS  - Glargine 16 units sq bid - Gabapentin 300 mg PO tid    HLD - Statin as above    GERD - Protonix 40 mg PO daily    Depression - Citalopram 40 mg PO daily - Seroquel 400 mg PO daily bedtime     BPH - Flomax 0.4 mg PO bid    Back pain  - Dilaudid as above    Hypothyroidism  - Synthroid 50 mcg PO daily    DVT prophylaxis: Heparin 5000 units sq q8hr       Subjective: Pt seen and examined at the bedside. CT surgery planning for CABG on Fri 01/23/2023. Pre - CABG planning underway. Appreciate cardiology following along. Toprol XL added today by cardiology. No chest pain reported by pt today. Glargine changed to 16 units sq bid.  Physical Exam: Vitals:   01/21/23 0723 01/21/23 0800 01/21/23 0832 01/21/23 1114  BP: (!) 151/93   132/88  Pulse: 92   89  Resp: 18 18    Temp: 97.6 F (36.4 C) 97.6 F (36.4 C)    TempSrc: Oral Oral    SpO2:   92%   Weight:      Height:       Constitutional:      Appearance: He is well-developed.  HENT:     Head: Normocephalic and atraumatic.  Cardiovascular:     Rate and Rhythm: Normal rate and regular rhythm.  Pulmonary:     Effort: Pulmonary effort is normal.  Abdominal:     Palpations: Abdomen is soft.   Skin:    General: Skin is warm.     Coloration: Skin is not pale.  Neurological:     Mental Status: He is alert and oriented to person, place, and time.  Psychiatric:        Mood and Affect: Mood normal.   Data Reviewed:   Disposition: Status is: Inpatient  Planned Discharge Destination: Home    Time spent: 35 minutes  Author: Lucienne Minks , MD 01/21/2023 12:25 PM  For on call review [email or web address].

## 2023-01-21 NOTE — Progress Notes (Signed)
Mobility Specialist - Progress Note   01/21/23 1500  Mobility  Activity Ambulated with assistance in hallway  Level of Assistance Standby assist, set-up cues, supervision of patient - no hands on  Assistive Device None  Distance Ambulated (ft) 500 ft  Activity Response Tolerated well  Mobility Referral Yes  $Mobility charge 1 Mobility    Pt received in bed agreeable to mobility. No complaints throughout, tolerated increased distance well. Left in room w/ all needs met.   [place] Specialist Please contact via SecureChat or Rehab office at [phone]

## 2023-01-21 NOTE — Progress Notes (Signed)
Pre-CABG study completed.   Please see CV Proc for preliminary results.   Darlin Coco, RDMS, RVT

## 2023-01-21 NOTE — Inpatient Diabetes Management (Signed)
Inpatient Diabetes Program Recommendations  AACE/ADA: New Consensus Statement on Inpatient Glycemic Control (2015)  Target Ranges:  Prepandial:   less than 140 mg/dL      Peak postprandial:   less than 180 mg/dL (1-2 hours)      Critically ill patients:  140 - 180 mg/dL   Lab Results  Component Value Date   GLUCAP 241 (H) 01/21/2023   HGBA1C 6.7 (H) 01/19/2023    Review of Glycemic Control  Latest Reference Range & Units 01/20/23 06:23 01/20/23 11:28 01/20/23 16:10 01/20/23 21:10 01/21/23 06:28  Glucose-Capillary 70 - 99 mg/dL 235 (H) 232 (H) 309 (H) 137 (H) 241 (H)   Diabetes history: Type 2 DM Outpatient Diabetes medications: Glipizide 10 mg BID, Jardiance 25 mg QD Current orders for Inpatient glycemic control:  Semglee 15 units QHS Novolog 0-15 units TID & HS Solumedrol 40 mg BID  Inpatient Diabetes Program Recommendations:    Glucose trends increase after steroid dose and meal intake.  -  Consider adding Novolog 4 units TID (Assuming patient consuming >50% of meals)   Thanks, Tama Headings RN, MSN, BC-ADM Inpatient Diabetes Coordinator Team Pager [phone] (8a-5p)

## 2023-01-21 NOTE — Progress Notes (Signed)
Rounding Note    Patient Name: Matthew [name] Date of Encounter: 01/21/2023  [place] Cardiologist: Zandra Abts  Subjective   Seen by CTS >> OR 2/9  Inpatient Medications    Scheduled Meds:  arformoterol  15 mcg Nebulization BID   aspirin EC  81 mg Oral Daily   atorvastatin  80 mg Oral QHS   budesonide (PULMICORT) nebulizer solution  0.5 mg Nebulization BID   citalopram  40 mg Oral QHS   cyanocobalamin  1,000 mcg Oral Daily   dextromethorphan-guaiFENesin  1 tablet Oral BID   doxycycline  100 mg Oral Q12H   gabapentin  300 mg Oral TID   heparin  5,000 Units Subcutaneous Q8H   insulin aspart  0-15 Units Subcutaneous TID WC   insulin aspart  0-5 Units Subcutaneous QHS   insulin glargine-yfgn  15 Units Subcutaneous QHS   levothyroxine  50 mcg Oral Q0600   methylPREDNISolone (SOLU-MEDROL) injection  40 mg Intravenous Q12H   metoprolol succinate  25 mg Oral QHS   nitroGLYCERIN  0.4 mg Transdermal Daily   pantoprazole  40 mg Oral Daily   QUEtiapine  400 mg Oral QHS   sodium chloride flush  3 mL Intravenous Q12H   sodium chloride flush  3 mL Intravenous Q12H   sodium chloride flush  3 mL Intravenous Q12H   tamsulosin  0.4 mg Oral BID   Continuous Infusions:  sodium chloride     sodium chloride     PRN Meds: sodium chloride, sodium chloride, acetaminophen **OR** acetaminophen, HYDROmorphone (DILAUDID) injection, levalbuterol, nitroGLYCERIN, ondansetron **OR** ondansetron (ZOFRAN) IV, sodium chloride flush, sodium chloride flush   Vital Signs    Vitals:   01/21/23 0500 01/21/23 0723 01/21/23 0800 01/21/23 0832  BP: 130/77 (!) 151/93    Pulse: 86 92    Resp:  18 18   Temp:  97.6 F (36.4 C) 97.6 F (36.4 C)   TempSrc:  Oral Oral   SpO2: 92%   92%  Weight: 81.2 kg     Height:        Intake/Output Summary (Last 24 hours) at 01/21/2023 1011 Last data filed at 01/21/2023 0850 Gross per 24 hour  Intake 1562.16 ml  Output 800 ml  Net 762.16 ml       01/21/2023    5:00 AM 01/19/2023    9:05 PM 01/19/2023   10:41 AM  Last 3 Weights  Weight (lbs) 179 lb 0.2 oz 179 lb 14.3 oz 182 lb 14.4 oz  Weight (kg) 81.2 kg 81.6 kg 82.963 kg      Telemetry    NSR - Personally Reviewed  ECG    SR with evolving inferior STE- Personally Reviewed  Physical Exam   GEN: No acute distress.   Neck: No JVD Cardiac: RRR, no murmurs, rubs, or gallops.  Respiratory: Clear to auscultation bilaterally. GI: Soft, nontender, non-distended  MS: No edema; No deformity. Neuro:  Nonfocal  Psych: Normal affect  Ext:  TR band R radial; no edema  Labs    High Sensitivity Troponin:   Recent Labs  Lab 01/19/23 0240 01/19/23 0440 01/19/23 1508 01/19/23 1704  TROPONINIHS '5 5 7 8     '$ Chemistry Recent Labs  Lab 01/19/23 0240 01/19/23 1103 01/20/23 0102 01/21/23 0116  NA 135  --  136 133*  K 4.2  --  4.7 4.8  CL 100  --  105 105  CO2 22  --  22 19*  GLUCOSE 267*  --  191* 226*  BUN 15  --  16 24*  CREATININE 0.95 0.92 0.87 0.90  CALCIUM 9.2  --  9.1 8.8*  MG  --  1.9  --  2.1  PROT 7.6  --   --   --   ALBUMIN 4.2  --   --   --   AST 75*  --   --   --   ALT 103*  --   --   --   ALKPHOS 94  --   --   --   BILITOT 1.1  --   --   --   GFRNONAA >60 >60 >60 >60  ANIONGAP 13  --  9 9    Lipids  Recent Labs  Lab 01/20/23 0102  CHOL 123  TRIG 89  HDL 51  LDLCALC 54  CHOLHDL 2.4    Hematology Recent Labs  Lab 01/19/23 1103 01/20/23 0102 01/21/23 0116  WBC 15.7* 16.0* 14.5*  RBC 3.90* 3.74* 3.51*  HGB 12.8* 12.2* 11.5*  HCT 38.6* 37.2* 35.7*  MCV 99.0 99.5 101.7*  MCH 32.8 32.6 32.8  MCHC 33.2 32.8 32.2  RDW 13.9 14.0 13.8  PLT 149* 139* 140*   Thyroid  Recent Labs  Lab 01/19/23 1103  TSH 2.513    BNP Recent Labs  Lab 01/19/23 0240  BNP 45.0    DDimer No results for input(s): "DDIMER" in the last 168 hours.   Radiology    CARDIAC CATHETERIZATION  Result Date: 01/20/2023   Prox RCA lesion is 45% stenosed.   Prox LAD  lesion is 35% stenosed.   2nd Diag lesion is 50% stenosed.   Mid LAD lesion is 85% stenosed.   Ramus lesion is 75% stenosed.   1st Mrg lesion is 90% stenosed.   2nd Mrg lesion is 99% stenosed.   LV end diastolic pressure is mildly elevated. Complex 2 vessel obstructive CAD with heavily calcified vessels. Mildly elevated LVEDP 16 mm Hg Plan; review with heart team approach. Will consult CT surgery. If not felt to be a candidate for surgery could consider PCI with atherectomy of LAD/OM. Need to initiate guideline directed medical therapy.   ECHOCARDIOGRAM COMPLETE  Result Date: 01/19/2023    ECHOCARDIOGRAM REPORT   Patient Name:   Matthew [name] Date of Exam: 01/19/2023 Medical Rec #:  [id number]       Height:       69.0 in Accession #:    [id number]      Weight:       182.9 lb Date of Birth:  29-Sep-1946        BSA:          1.989 m Patient Age:    77 years        BP:           108/75 mmHg Patient Gender: M               HR:           97 bpm. Exam Location:  [facility] Procedure: 2D Echo, Cardiac Doppler and Color Doppler Indications:    Colon cancer ([place]) (From Hx)  History:        Patient has no prior history of Echocardiogram examinations.                 COPD; Risk Factors:Diabetes and Dyslipidemia. Acute respiratory                 failure with hypoxia ([place]), Colon  cancer ([place]) (From Hx).  Sonographer:    Alvino Chapel RCS Referring Phys: [place]  1. Left ventricular ejection fraction, by estimation, is 65 to 70%. The left ventricle has normal function. The left ventricle has no regional wall motion abnormalities. There is mild left ventricular hypertrophy. Left ventricular diastolic parameters are indeterminate. Elevated left atrial pressure.  2. Right ventricular systolic function is normal. The right ventricular size is normal.  3. There is no evidence of cardiac tamponade.  4. The mitral valve is normal in structure. No evidence of mitral valve regurgitation. No evidence of mitral  stenosis.  5. The aortic valve is tricuspid. Aortic valve regurgitation is not visualized. No aortic stenosis is present.  6. The inferior vena cava is normal in size with greater than 50% respiratory variability, suggesting right atrial pressure of 3 mmHg. FINDINGS  Left Ventricle: Left ventricular ejection fraction, by estimation, is 65 to 70%. The left ventricle has normal function. The left ventricle has no regional wall motion abnormalities. Definity contrast agent was given IV to delineate the left ventricular  endocardial borders. The left ventricular internal cavity size was normal in size. There is mild left ventricular hypertrophy. Left ventricular diastolic parameters are indeterminate. Elevated left atrial pressure. Right Ventricle: The right ventricular size is normal. Right vetricular wall thickness was not well visualized. Right ventricular systolic function is normal. Left Atrium: Left atrial size was normal in size. Right Atrium: Right atrial size was normal in size. Pericardium: Trivial pericardial effusion is present. The pericardial effusion is circumferential. There is no evidence of cardiac tamponade. Mitral Valve: The mitral valve is normal in structure. No evidence of mitral valve regurgitation. No evidence of mitral valve stenosis. Tricuspid Valve: The tricuspid valve is normal in structure. Tricuspid valve regurgitation is not demonstrated. No evidence of tricuspid stenosis. Aortic Valve: The aortic valve is tricuspid. Aortic valve regurgitation is not visualized. No aortic stenosis is present. Aortic valve mean gradient measures 3.7 mmHg. Aortic valve peak gradient measures 7.3 mmHg. Aortic valve area, by VTI measures 2.47 cm. Pulmonic Valve: The pulmonic valve was not well visualized. Pulmonic valve regurgitation is not visualized. No evidence of pulmonic stenosis. Aorta: The aortic root is normal in size and structure. Venous: The inferior vena cava is normal in size with greater than  50% respiratory variability, suggesting right atrial pressure of 3 mmHg. IAS/Shunts: No atrial level shunt detected by color flow Doppler.  LEFT VENTRICLE PLAX 2D LVIDd:         4.70 cm   Diastology LVIDs:         2.60 cm   LV e' medial:    5.66 cm/s LV PW:         1.10 cm   LV E/e' medial:  16.7 LV IVS:        1.10 cm   LV e' lateral:   6.09 cm/s LVOT diam:     2.10 cm   LV E/e' lateral: 15.5 LV SV:         52 LV SV Index:   26 LVOT Area:     3.46 cm  RIGHT VENTRICLE RV S prime:     14.10 cm/s TAPSE (M-mode): 1.6 cm LEFT ATRIUM           Index        RIGHT ATRIUM           Index LA diam:      3.80 cm 1.91 cm/m   RA Area:  13.60 cm LA Vol (A2C): 53.8 ml 27.05 ml/m  RA Volume:   35.90 ml  18.05 ml/m LA Vol (A4C): 54.0 ml 27.15 ml/m  AORTIC VALVE AV Area (Vmax):    2.09 cm AV Area (Vmean):   2.30 cm AV Area (VTI):     2.47 cm AV Vmax:           135.12 cm/s AV Vmean:          90.643 cm/s AV VTI:            0.211 m AV Peak Grad:      7.3 mmHg AV Mean Grad:      3.7 mmHg LVOT Vmax:         81.60 cm/s LVOT Vmean:        60.100 cm/s LVOT VTI:          0.150 m LVOT/AV VTI ratio: 0.71  AORTA Ao Root diam: 3.80 cm MITRAL VALVE MV Area (PHT): 3.27 cm    SHUNTS MV Decel Time: 232 msec    Systemic VTI:  0.15 m MV E velocity: 94.30 cm/s  Systemic Diam: 2.10 cm MV A velocity: 94.70 cm/s MV E/A ratio:  1.00 Carlyle Dolly MD Electronically signed by Carlyle Dolly MD Signature Date/Time: 01/19/2023/1:15:17 PM    Final     Cardiac Studies   Cath 01/20/23 Complex 2 vessel obstructive CAD with heavily calcified vessels. Mildly elevated LVEDP 16 mm Hg  TTE 01/19/23: Complex 2 vessel obstructive CAD with heavily calcified vessels. Mildly elevated LVEDP 16 mm Hg  Patient Profile     77 y.o. male  with a hx of DM2, bipolar, COPD presents with UA>>MVD  Assessment & Plan    Unstable angina:  No chest pain or dyspnea; cont ASA, atorva 80, start Toprol XL 25 QPM. MVD:  OR 2/9 T2DM:  Would benefit from SGLT2  inhibitor; consider later HL:  Cont atorvastatin 80  For questions or updates, please contact [place] Please consult [email or web address] for contact info under        Signed, Early Osmond, MD  01/21/2023, 10:11 AM

## 2023-01-21 NOTE — Significant Event (Signed)
Rapid Response Event Note   Reason for Call :  Blurred/double vision  Per RN, pt says he has been having blurry/double vision since Friday. He says this worsened after his cardiac cath yesterday.  Initial Focused Assessment:  Pt lying in bed with eyes open, in no visible distress. He reports blurry/vision that started Friday and worsened yesterday after his cardiac cath. He has a L sensory deficit and a questionable L facial droop(pt's teeth aren't in, pt says smile is different but wife says it looks normal for him). NIH-2, LSN-Friday.  T-98.2, HR-91, BP-161/97, RR-20, SpO2-94% on RA.   Interventions:  CBG-253 CT head STAT-negative for acute changes Plan of Care:  [facility] swallow screen prior to eating/drinking. MRI when able. Continue to monitor pt closley, call RRT if further assistance needed.   Event Summary:   MD Notified: Dr. Claria Dice Call [phone] Arrival Time:2020 End SLHT:[id number]  Dillard Essex, RN

## 2023-01-22 ENCOUNTER — Encounter (HOSPITAL_COMMUNITY): Payer: Self-pay | Admitting: Internal Medicine

## 2023-01-22 ENCOUNTER — Other Ambulatory Visit (HOSPITAL_COMMUNITY): Payer: No Typology Code available for payment source

## 2023-01-22 ENCOUNTER — Inpatient Hospital Stay (HOSPITAL_COMMUNITY): Payer: Medicare Other

## 2023-01-22 DIAGNOSIS — I25110 Atherosclerotic heart disease of native coronary artery with unstable angina pectoris: Secondary | ICD-10-CM

## 2023-01-22 DIAGNOSIS — I200 Unstable angina: Secondary | ICD-10-CM | POA: Diagnosis not present

## 2023-01-22 DIAGNOSIS — R079 Chest pain, unspecified: Secondary | ICD-10-CM | POA: Diagnosis not present

## 2023-01-22 LAB — GLUCOSE, CAPILLARY
Glucose-Capillary: 286 mg/dL — ABNORMAL HIGH (ref 70–99)
Glucose-Capillary: 318 mg/dL — ABNORMAL HIGH (ref 70–99)
Glucose-Capillary: 459 mg/dL — ABNORMAL HIGH (ref 70–99)
Glucose-Capillary: 323 mg/dL — ABNORMAL HIGH (ref 70–99)

## 2023-01-22 LAB — CBC
HCT: 35.9 % — ABNORMAL LOW (ref 39.0–52.0)
Hemoglobin: 12 g/dL — ABNORMAL LOW (ref 13.0–17.0)
MCH: 32.5 pg (ref 26.0–34.0)
MCHC: 33.4 g/dL (ref 30.0–36.0)
MCV: 97.3 fL (ref 80.0–100.0)
Platelets: 184 10*3/uL (ref 150–400)
RBC: 3.69 MIL/uL — ABNORMAL LOW (ref 4.22–5.81)
RDW: 13.5 % (ref 11.5–15.5)
WBC: 10 10*3/uL (ref 4.0–10.5)
nRBC: 0 % (ref 0.0–0.2)

## 2023-01-22 LAB — COMPREHENSIVE METABOLIC PANEL
ALT: 42 U/L (ref 0–44)
AST: 22 U/L (ref 15–41)
Albumin: 3 g/dL — ABNORMAL LOW (ref 3.5–5.0)
Alkaline Phosphatase: 61 U/L (ref 38–126)
Anion gap: 9 (ref 5–15)
BUN: 31 mg/dL — ABNORMAL HIGH (ref 8–23)
CO2: 23 mmol/L (ref 22–32)
Calcium: 8.9 mg/dL (ref 8.9–10.3)
Chloride: 100 mmol/L (ref 98–111)
Creatinine, Ser: 0.89 mg/dL (ref 0.61–1.24)
GFR, Estimated: >60 mL/min (ref >60)
Glucose, Bld: 204 mg/dL — ABNORMAL HIGH (ref 70–99)
Potassium: 4.5 mmol/L (ref 3.5–5.1)
Sodium: 132 mmol/L — ABNORMAL LOW (ref 135–145)
Total Bilirubin: 0.5 mg/dL (ref 0.3–1.2)
Total Protein: 6.4 g/dL — ABNORMAL LOW (ref 6.5–8.1)

## 2023-01-22 LAB — LIPOPROTEIN A (LPA): Lipoprotein (a): 18.5 nmol/L (ref <75.0)

## 2023-01-22 LAB — MAGNESIUM: Magnesium: 2.2 mg/dL (ref 1.7–2.4)

## 2023-01-22 MED ORDER — CHLORHEXIDINE GLUCONATE CLOTH 2 % EX PADS
6.0000 | MEDICATED_PAD | Freq: Once | CUTANEOUS | Status: AC
  Administered 2023-01-23: 6 via TOPICAL

## 2023-01-22 MED ORDER — VANCOMYCIN HCL 1250 MG/250ML IV SOLN
1250.0000 mg | INTRAVENOUS | Status: AC
  Administered 2023-01-23: 1250 mg via INTRAVENOUS
  Filled 2023-01-22: qty 250

## 2023-01-22 MED ORDER — PHENYLEPHRINE HCL-NACL 20-0.9 MG/250ML-% IV SOLN
30.0000 ug/min | INTRAVENOUS | Status: AC
  Administered 2023-01-23: 25 ug/min via INTRAVENOUS
  Filled 2023-01-22: qty 250

## 2023-01-22 MED ORDER — DIAZEPAM 5 MG PO TABS
5.0000 mg | ORAL_TABLET | Freq: Once | ORAL | Status: AC
  Administered 2023-01-23: 5 mg via ORAL
  Filled 2023-01-22: qty 1

## 2023-01-22 MED ORDER — INSULIN GLARGINE-YFGN 100 UNIT/ML ~~LOC~~ SOLN
15.0000 [IU] | Freq: Once | SUBCUTANEOUS | Status: AC
  Administered 2023-01-22: 15 [IU] via SUBCUTANEOUS
  Filled 2023-01-22: qty 0.15

## 2023-01-22 MED ORDER — MILRINONE LACTATE IN DEXTROSE 20-5 MG/100ML-% IV SOLN
0.3000 ug/kg/min | INTRAVENOUS | Status: DC
  Filled 2023-01-22: qty 100

## 2023-01-22 MED ORDER — INSULIN REGULAR(HUMAN) IN NACL 100-0.9 UT/100ML-% IV SOLN
INTRAVENOUS | Status: AC
  Administered 2023-01-23: 1 [IU]/h via INTRAVENOUS
  Filled 2023-01-22: qty 100

## 2023-01-22 MED ORDER — CEFAZOLIN SODIUM-DEXTROSE 2-4 GM/100ML-% IV SOLN
2.0000 g | INTRAVENOUS | Status: AC
  Administered 2023-01-23: 2 g via INTRAVENOUS
  Filled 2023-01-22: qty 100

## 2023-01-22 MED ORDER — HEPARIN 30,000 UNITS/1000 ML (OHS) CELLSAVER SOLUTION
Status: DC
  Filled 2023-01-22: qty 1000

## 2023-01-22 MED ORDER — NITROGLYCERIN IN D5W 200-5 MCG/ML-% IV SOLN
2.0000 ug/min | INTRAVENOUS | Status: AC
  Administered 2023-01-23: 16.6 ug/min via INTRAVENOUS
  Filled 2023-01-22: qty 250

## 2023-01-22 MED ORDER — DEXMEDETOMIDINE HCL IN NACL 400 MCG/100ML IV SOLN
0.1000 ug/kg/h | INTRAVENOUS | Status: AC
  Administered 2023-01-23: .2 ug/kg/h via INTRAVENOUS
  Filled 2023-01-22: qty 100

## 2023-01-22 MED ORDER — TEMAZEPAM 15 MG PO CAPS
15.0000 mg | ORAL_CAPSULE | Freq: Once | ORAL | Status: AC | PRN
  Administered 2023-01-22: 15 mg via ORAL
  Filled 2023-01-22: qty 1

## 2023-01-22 MED ORDER — INSULIN GLARGINE-YFGN 100 UNIT/ML ~~LOC~~ SOLN
18.0000 [IU] | Freq: Two times a day (BID) | SUBCUTANEOUS | Status: DC
  Administered 2023-01-22 (×2): 18 [IU] via SUBCUTANEOUS
  Filled 2023-01-22 (×4): qty 0.18

## 2023-01-22 MED ORDER — MAGNESIUM SULFATE 50 % IJ SOLN
40.0000 meq | INTRAMUSCULAR | Status: DC
  Filled 2023-01-22: qty 9.85

## 2023-01-22 MED ORDER — TRANEXAMIC ACID 1000 MG/10ML IV SOLN
1.5000 mg/kg/h | INTRAVENOUS | Status: AC
  Administered 2023-01-23: 1.5 mg/kg/h via INTRAVENOUS
  Filled 2023-01-22: qty 25

## 2023-01-22 MED ORDER — TRANEXAMIC ACID (OHS) PUMP PRIME SOLUTION
2.0000 mg/kg | INTRAVENOUS | Status: DC
  Filled 2023-01-22: qty 1.57

## 2023-01-22 MED ORDER — MUPIROCIN 2 % EX OINT: 1.0000 | TOPICAL_OINTMENT | Freq: Two times a day (BID) | CUTANEOUS | Status: DC

## 2023-01-22 MED ORDER — IOHEXOL 350 MG/ML SOLN
75.0000 mL | Freq: Once | INTRAVENOUS | Status: AC | PRN
  Administered 2023-01-22: 75 mL via INTRAVENOUS

## 2023-01-22 MED ORDER — CHLORHEXIDINE GLUCONATE CLOTH 2 % EX PADS
6.0000 | MEDICATED_PAD | Freq: Once | CUTANEOUS | Status: AC
  Administered 2023-01-22: 6 via TOPICAL

## 2023-01-22 MED ORDER — METOPROLOL TARTRATE 12.5 MG HALF TABLET
12.5000 mg | ORAL_TABLET | Freq: Once | ORAL | Status: AC
  Administered 2023-01-23: 12.5 mg via ORAL
  Filled 2023-01-22: qty 1

## 2023-01-22 MED ORDER — BISACODYL 5 MG PO TBEC
5.0000 mg | DELAYED_RELEASE_TABLET | Freq: Once | ORAL | Status: AC
  Administered 2023-01-22: 5 mg via ORAL
  Filled 2023-01-22: qty 1

## 2023-01-22 MED ORDER — NOREPINEPHRINE 4 MG/250ML-% IV SOLN
0.0000 ug/min | INTRAVENOUS | Status: DC
  Filled 2023-01-22: qty 250

## 2023-01-22 MED ORDER — POTASSIUM CHLORIDE 2 MEQ/ML IV SOLN
80.0000 meq | INTRAVENOUS | Status: DC
  Filled 2023-01-22: qty 40

## 2023-01-22 MED ORDER — EPINEPHRINE HCL 5 MG/250ML IV SOLN IN NS
0.0000 ug/min | INTRAVENOUS | Status: DC
  Filled 2023-01-22: qty 250

## 2023-01-22 MED ORDER — TRANEXAMIC ACID (OHS) BOLUS VIA INFUSION
15.0000 mg/kg | INTRAVENOUS | Status: AC
  Administered 2023-01-23: 1179 mg via INTRAVENOUS
  Filled 2023-01-22: qty 1179

## 2023-01-22 MED ORDER — PREDNISONE 20 MG PO TABS
20.0000 mg | ORAL_TABLET | Freq: Two times a day (BID) | ORAL | Status: DC
  Administered 2023-01-22 – 2023-01-25 (×4): 20 mg via ORAL
  Filled 2023-01-22 (×4): qty 1

## 2023-01-22 MED ORDER — PLASMA-LYTE A IV SOLN
INTRAVENOUS | Status: DC
  Filled 2023-01-22: qty 2.5

## 2023-01-22 MED ORDER — CHLORHEXIDINE GLUCONATE 0.12 % MT SOLN
15.0000 mL | Freq: Once | OROMUCOSAL | Status: AC
  Administered 2023-01-23: 15 mL via OROMUCOSAL
  Filled 2023-01-22: qty 15

## 2023-01-22 NOTE — Progress Notes (Signed)
Mobility Specialist - Progress Note   01/22/23 1055  Mobility  Activity Ambulated with assistance in hallway  Level of Assistance Contact guard assist, steadying assist  Assistive Device None  Distance Ambulated (ft) 500 ft  Activity Response Tolerated well  Mobility Referral Yes  $Mobility charge 1 Mobility    Pt received in bed agreeable to mobility. No complaints throughout. Left in room w/ staff member present.   [place] Specialist Please contact via SecureChat or Rehab office at [phone]

## 2023-01-22 NOTE — Progress Notes (Addendum)
Called by nursing, patient reports monocular blurry vision since Friday.  This started after his heart cath. CT head, CTA head, MRI brain done.  All negative. F/u with outpatient opthalmology

## 2023-01-22 NOTE — Progress Notes (Signed)
  Progress Note   Patient: Matthew [name] GNO:[id number] DOB: 02-12-1946 DOA: 01/19/2023     3 DOS: the patient was seen and examined on 01/22/2023   Brief hospital course:  Assessment and Plan: Chest pain (s/p cardiac cath with MVD) - s/p cardiac cath with multi vessel disease --> CT surgery planning for CABG on Fri 01/23/2023  - ASA 81 mg PO daily - Lipitor 80 mg PO daily  - Toprol XL 25 mg PO daily  - Dilaudid 0.5 mg q3 hr PRN    Acute hypoxic respiratory failure, Present on admission (due to COPD)  - Brovana 15 mcg bid  - Pulmicort 0.5 mg bid  - Mucinex 600 mg PO bid  - Xopenex q8hr PRN  - Doxycycline 100 mg PO q12  - PO prednisone 20 mg PO bid    Diabetes mellitus type 2 with hyperglycemia (A1c 6.7) - Novolog SS ACHS  - Glargine 18 units sq bid - Gabapentin 300 mg PO tid    HLD - Statin as above    GERD - Protonix 40 mg PO daily    Depression - Citalopram 40 mg PO daily - Seroquel 400 mg PO daily bedtime     BPH - Flomax 0.4 mg PO bid    Back pain  - Dilaudid as above    Hypothyroidism  - Synthroid 50 mcg PO daily    DVT prophylaxis: Heparin 5000 units sq q8hr        Subjective: Pt seen and examined at the bedside. Some blurry vision noted overnight, but MRI brain negative for acute CVA. CT angio head and neck showed calcified plaques but no hemodynamically significant stenosis. CT surgery planning to proceed with CABG tmr (Fri 01/23/2023).  Physical Exam: Vitals:   01/22/23 0100 01/22/23 0403 01/22/23 0829 01/22/23 1043  BP: (!) 151/91 (!) 155/90  136/84  Pulse:    86  Resp: '18 19  20  '$ Temp: 98.3 F (36.8 C) 97.6 F (36.4 C)  (!) 97.4 F (36.3 C)  TempSrc: Oral Oral  Oral  SpO2: 95% 92% 92% 91%  Weight:  78.6 kg    Height:       Constitutional:      Appearance: He is well-developed.  HENT:     Head: Normocephalic and atraumatic.  Cardiovascular:     Rate and Rhythm: Normal rate and regular rhythm.  Pulmonary:     Effort: Pulmonary effort is  normal.  Abdominal:     Palpations: Abdomen is soft.  Skin:    General: Skin is warm.     Coloration: Skin is not pale.  Neurological:     Mental Status: He is alert and oriented to person, place, and time.  Psychiatric:        Mood and Affect: Mood normal.   Data Reviewed:   Disposition: Status is: Inpatient  Planned Discharge Destination:  Disposition after CABG     Time spent: 35 minutes  Author: Lucienne Minks , MD 01/22/2023 12:06 PM  For on call review [email or web address].

## 2023-01-22 NOTE — H&P (View-Only) (Signed)
2 Days Post-Op Procedure(s) (LRB): LEFT HEART CATH AND CORONARY ANGIOGRAPHY (N/A) Subjective:  No chest pain or SOB. Reported blurry vision yesterday that he has noticed since cath. CT and MRI brain negative for stroke. He says vision is better today but still some blurry vision. No field cuts.  Objective: Vital signs in last 24 hours: Temp:  [97.6 F (36.4 C)-98.3 F (36.8 C)] 97.6 F (36.4 C) (02/08 0403) Pulse Rate:  [89-93] 93 (02/07 1953) Cardiac Rhythm: Asystole (02/07 2153) Resp:  [18-19] 19 (02/08 0403) BP: (132-173)/(88-106) 155/90 (02/08 0403) SpO2:  [92 %-95 %] 92 % (02/08 0403) Weight:  [78.6 kg] 78.6 kg (02/08 0403)  Hemodynamic parameters for last 24 hours:    Intake/Output from previous day: 02/07 0701 - 02/08 0700 In: 655 [P.O.:655] Out: 550 [Urine:550] Intake/Output this shift: No intake/output data recorded.  General appearance: alert and cooperative Neurologic: non-focal Heart: regular rate and rhythm Lungs: clear to auscultation bilaterally Extremities: no edema  Lab Results: Recent Labs    01/21/23 0116 01/22/23 0029  WBC 14.5* 10.0  HGB 11.5* 12.0*  HCT 35.7* 35.9*  PLT 140* 184   BMET:  Recent Labs    01/21/23 0116 01/22/23 0029  NA 133* 132*  K 4.8 4.5  CL 105 100  CO2 19* 23  GLUCOSE 226* 204*  BUN 24* 31*  CREATININE 0.90 0.89  CALCIUM 8.8* 8.9    PT/INR: No results for input(s): "LABPROT", "INR" in the last 72 hours. ABG    Component Value Date/Time   TCO2 24 07/17/2017 1234   CBG (last 3)  Recent Labs    01/21/23 1629 01/21/23 2105 01/22/23 0604  GLUCAP 404* 253* 286*    Assessment/Plan: S/P Procedure(s) (LRB): LEFT HEART CATH AND CORONARY ANGIOGRAPHY (N/A)  Severe multi-vessel CAD. Plan CABG tomorrow. He reports some blurry vision since cath but negative MRI brain. No field cuts that would suggest emboli to eyes. His CTA does show significant atherosclerotic disease of the aortic arch but ascending aorta looks  ok. His risk of atheroembolic is increased but I don't think there is any other good option. I discussed this with patient and wife and they are in agreement to proceed in the am.   LOS: 3 days    Gaye Pollack 01/22/2023

## 2023-01-22 NOTE — Progress Notes (Signed)
2 Days Post-Op Procedure(s) (LRB): LEFT HEART CATH AND CORONARY ANGIOGRAPHY (N/A) Subjective:  No chest pain or SOB. Reported blurry vision yesterday that he has noticed since cath. CT and MRI brain negative for stroke. He says vision is better today but still some blurry vision. No field cuts.  Objective: Vital signs in last 24 hours: Temp:  [97.6 F (36.4 C)-98.3 F (36.8 C)] 97.6 F (36.4 C) (02/08 0403) Pulse Rate:  [89-93] 93 (02/07 1953) Cardiac Rhythm: Asystole (02/07 2153) Resp:  [18-19] 19 (02/08 0403) BP: (132-173)/(88-106) 155/90 (02/08 0403) SpO2:  [92 %-95 %] 92 % (02/08 0403) Weight:  [78.6 kg] 78.6 kg (02/08 0403)  Hemodynamic parameters for last 24 hours:    Intake/Output from previous day: 02/07 0701 - 02/08 0700 In: 655 [P.O.:655] Out: 550 [Urine:550] Intake/Output this shift: No intake/output data recorded.  General appearance: alert and cooperative Neurologic: non-focal Heart: regular rate and rhythm Lungs: clear to auscultation bilaterally Extremities: no edema  Lab Results: Recent Labs    01/21/23 0116 01/22/23 0029  WBC 14.5* 10.0  HGB 11.5* 12.0*  HCT 35.7* 35.9*  PLT 140* 184   BMET:  Recent Labs    01/21/23 0116 01/22/23 0029  NA 133* 132*  K 4.8 4.5  CL 105 100  CO2 19* 23  GLUCOSE 226* 204*  BUN 24* 31*  CREATININE 0.90 0.89  CALCIUM 8.8* 8.9    PT/INR: No results for input(s): "LABPROT", "INR" in the last 72 hours. ABG    Component Value Date/Time   TCO2 24 07/17/2017 1234   CBG (last 3)  Recent Labs    01/21/23 1629 01/21/23 2105 01/22/23 0604  GLUCAP 404* 253* 286*    Assessment/Plan: S/P Procedure(s) (LRB): LEFT HEART CATH AND CORONARY ANGIOGRAPHY (N/A)  Severe multi-vessel CAD. Plan CABG tomorrow. He reports some blurry vision since cath but negative MRI brain. No field cuts that would suggest emboli to eyes. His CTA does show significant atherosclerotic disease of the aortic arch but ascending aorta looks  ok. His risk of atheroembolic is increased but I don't think there is any other good option. I discussed this with patient and wife and they are in agreement to proceed in the am.   LOS: 3 days    Ras Kollman K Antonius Hartlage 01/22/2023   

## 2023-01-22 NOTE — Progress Notes (Signed)
Rounding Note    Patient Name: Matthew [name] Date of Encounter: 01/22/2023  [place] Cardiologist: Matthew [name]  Subjective   Blurry vision yesterday night >> MRI negative >> CT angio performed but not yet read  Vision somewhat improved  Inpatient Medications    Scheduled Meds:  arformoterol  15 mcg Nebulization BID   aspirin EC  81 mg Oral Daily   atorvastatin  80 mg Oral QHS   budesonide (PULMICORT) nebulizer solution  0.5 mg Nebulization BID   citalopram  40 mg Oral QHS   cyanocobalamin  1,000 mcg Oral Daily   dextromethorphan-guaiFENesin  1 tablet Oral BID   doxycycline  100 mg Oral Q12H   gabapentin  300 mg Oral TID   heparin  5,000 Units Subcutaneous Q8H   insulin aspart  0-15 Units Subcutaneous TID WC   insulin aspart  0-5 Units Subcutaneous QHS   insulin glargine-yfgn  18 Units Subcutaneous BID   levothyroxine  50 mcg Oral Q0600   methylPREDNISolone (SOLU-MEDROL) injection  40 mg Intravenous Q12H   metoprolol succinate  25 mg Oral QHS   nitroGLYCERIN  0.4 mg Transdermal Daily   pantoprazole  40 mg Oral Daily   QUEtiapine  400 mg Oral QHS   sodium chloride flush  3 mL Intravenous Q12H   sodium chloride flush  3 mL Intravenous Q12H   sodium chloride flush  3 mL Intravenous Q12H   tamsulosin  0.4 mg Oral BID   Continuous Infusions:  sodium chloride     sodium chloride     PRN Meds: sodium chloride, sodium chloride, acetaminophen **OR** acetaminophen, HYDROmorphone (DILAUDID) injection, levalbuterol, nitroGLYCERIN, ondansetron **OR** ondansetron (ZOFRAN) IV, sodium chloride flush, sodium chloride flush   Vital Signs    Vitals:   01/21/23 2135 01/21/23 2245 01/22/23 0100 01/22/23 0403  BP: (!) 173/106 (!) 159/94 (!) 151/91 (!) 155/90  Pulse:      Resp:   18 19  Temp:   98.3 F (36.8 C) 97.6 F (36.4 C)  TempSrc:   Oral Oral  SpO2:   95% 92%  Weight:    78.6 kg  Height:        Intake/Output Summary (Last 24 hours) at 01/22/2023 7371 Last  data filed at 01/22/2023 0804 Gross per 24 hour  Intake 1015 ml  Output 550 ml  Net 465 ml      01/22/2023    4:03 AM 01/21/2023    5:00 AM 01/19/2023    9:05 PM  Last 3 Weights  Weight (lbs) 173 lb 3.2 oz 179 lb 0.2 oz 179 lb 14.3 oz  Weight (kg) 78.563 kg 81.2 kg 81.6 kg      Telemetry    NSR - Personally Reviewed  ECG    SR with evolving inferior STE- Personally Reviewed  Physical Exam   GEN: No acute distress.   Neck: No JVD Cardiac: RRR, no murmurs, rubs, or gallops.  Respiratory: Clear to auscultation bilaterally. GI: Soft, nontender, non-distended  MS: No edema; No deformity. Neuro:  Nonfocal  Psych: Normal affect  Ext:  TR band R radial; no edema  Labs    High Sensitivity Troponin:   Recent Labs  Lab 01/19/23 0240 01/19/23 0440 01/19/23 1508 01/19/23 1704  TROPONINIHS '5 5 7 8     '$ Chemistry Recent Labs  Lab 01/19/23 0240 01/19/23 1103 01/20/23 0102 01/21/23 0116 01/22/23 0029  NA 135  --  136 133* 132*  K 4.2  --  4.7 4.8 4.5  CL 100  --  105 105 100  CO2 22  --  22 19* 23  GLUCOSE 267*  --  191* 226* 204*  BUN 15  --  16 24* 31*  CREATININE 0.95 0.92 0.87 0.90 0.89  CALCIUM 9.2  --  9.1 8.8* 8.9  MG  --  1.9  --  2.1 2.2  PROT 7.6  --   --   --  6.4*  ALBUMIN 4.2  --   --   --  3.0*  AST 75*  --   --   --  22  ALT 103*  --   --   --  42  ALKPHOS 94  --   --   --  61  BILITOT 1.1  --   --   --  0.5  GFRNONAA >60 >60 >60 >60 >60  ANIONGAP 13  --  '9 9 9    '$ Lipids  Recent Labs  Lab 01/20/23 0102  CHOL 123  TRIG 89  HDL 51  LDLCALC 54  CHOLHDL 2.4    Hematology Recent Labs  Lab 01/20/23 0102 01/21/23 0116 01/22/23 0029  WBC 16.0* 14.5* 10.0  RBC 3.74* 3.51* 3.69*  HGB 12.2* 11.5* 12.0*  HCT 37.2* 35.7* 35.9*  MCV 99.5 101.7* 97.3  MCH 32.6 32.8 32.5  MCHC 32.8 32.2 33.4  RDW 14.0 13.8 13.5  PLT 139* 140* 184   Thyroid  Recent Labs  Lab 01/19/23 1103  TSH 2.513    BNP Recent Labs  Lab 01/19/23 0240  BNP 45.0     DDimer No results for input(s): "DDIMER" in the last 168 hours.   Radiology    MR BRAIN WO CONTRAST  Result Date: 01/22/2023 CLINICAL DATA:  Initial evaluation for headache, neuro deficit, double vision. EXAM: MRI HEAD WITHOUT CONTRAST TECHNIQUE: Multiplanar, multiecho pulse sequences of the brain and surrounding structures were obtained without intravenous contrast. COMPARISON:  Prior CT from earlier the same day. FINDINGS: Brain: Cerebral volume within normal limits. Patchy T2/FLAIR hyperintensity involving the periventricular and deep white matter both cerebral hemispheres, most characteristic of chronic microvascular ischemic disease, mild to moderate in nature. Superimposed subcentimeter remote white matter lacunar infarct noted at the anterior right frontal lobe (series 11, image 18). No evidence for acute or subacute ischemia. Gray-white matter differentiation maintained. No areas of chronic cortical infarction. No acute or chronic intracranial blood products. No mass lesion, midline shift or mass effect. No hydrocephalus or extra-axial fluid collection. Pituitary gland and suprasellar region within normal limits. Vascular: Major intracranial vascular flow voids are maintained. Skull and upper cervical spine: Cranial junctional limits. Bone marrow signal intensity normal. Small osteoma noted at the left frontal scalp. Scalp soft tissues otherwise unremarkable. Sinuses/Orbits: Globes and orbital soft tissues within normal limits. Paranasal sinuses are largely clear. No significant mastoid effusion. Other: None. IMPRESSION: 1. No acute intracranial abnormality. 2. Mild-to-moderate chronic microvascular ischemic disease. Electronically Signed   By: Jeannine Boga M.D.   On: 01/22/2023 02:12   VAS US DOPPLER PRE CABG  Result Date: 01/21/2023 PREOPERATIVE VASCULAR EVALUATION Patient Name:  Matthew [name]  Date of Exam:   01/21/2023 Medical Rec #: [id number]        Accession #:    [id number] Date of  Birth: 1946/08/06         Patient Gender: M Patient Age:   77 years Exam Location:  [facility] Procedure:      VAS US DOPPLER PRE CABG Referring Phys: Gilford Raid --------------------------------------------------------------------------------  Indications:      Pre-CABG. Risk Factors:     Hypertension, hyperlipidemia, Diabetes. Other Factors:    History of colon cancer. Comparison Study: No prior studies. Performing Technologist: Darlin Coco RDMS RVT  Examination Guidelines: A complete evaluation includes B-mode imaging, spectral Doppler, color Doppler, and power Doppler as needed of all accessible portions of each vessel. Bilateral testing is considered an integral part of a complete examination. Limited examinations for reoccurring indications may be performed as noted.  Right Carotid Findings: +----------+--------+--------+--------+------------+--------+           PSV cm/sEDV cm/sStenosisDescribe    Comments +----------+--------+--------+--------+------------+--------+ CCA Prox  73      16                                   +----------+--------+--------+--------+------------+--------+ CCA Distal80      17                                   +----------+--------+--------+--------+------------+--------+ ICA Prox  94      28                                   +----------+--------+--------+--------+------------+--------+ ICA Mid   59      18      1-39%   heterogenous         +----------+--------+--------+--------+------------+--------+ ICA Distal49      14                                   +----------+--------+--------+--------+------------+--------+ ECA       105     12                                   +----------+--------+--------+--------+------------+--------+ +----------+--------+-------+----------------+------------+           PSV cm/sEDV cmsDescribe        Arm Pressure +----------+--------+-------+----------------+------------+ Subclavian88              Multiphasic, WNL             +----------+--------+-------+----------------+------------+ +---------+--------+--+--------+--+---------+ VertebralPSV cm/s35EDV cm/s11Antegrade +---------+--------+--+--------+--+---------+ Left Carotid Findings: +----------+--------+--------+--------+------------+--------+           PSV cm/sEDV cm/sStenosisDescribe    Comments +----------+--------+--------+--------+------------+--------+ CCA Prox  73      13                                   +----------+--------+--------+--------+------------+--------+ CCA Distal72      19                                   +----------+--------+--------+--------+------------+--------+ ICA Prox  63      19      1-39%   heterogenous         +----------+--------+--------+--------+------------+--------+ ICA Mid   72      20                                   +----------+--------+--------+--------+------------+--------+  ICA Distal51      16                                   +----------+--------+--------+--------+------------+--------+ ECA       225     20                                   +----------+--------+--------+--------+------------+--------+  +----------+--------+--------+----------------+------------+ SubclavianPSV cm/sEDV cm/sDescribe        Arm Pressure +----------+--------+--------+----------------+------------+           176             Multiphasic, WNL             +----------+--------+--------+----------------+------------+ +---------+--------+--+--------+--+---------+ VertebralPSV cm/s37EDV cm/s10Antegrade +---------+--------+--+--------+--+---------+  ABI Findings: +---------+------------------+-----+---------+--------+ Right    Rt Pressure (mmHg)IndexWaveform Comment  +---------+------------------+-----+---------+--------+ Brachial 125                    triphasic         +---------+------------------+-----+---------+--------+ PTA      158                1.22 triphasic         +---------+------------------+-----+---------+--------+ DP       171               1.32 triphasic         +---------+------------------+-----+---------+--------+ Great Toe144               1.11 Normal            +---------+------------------+-----+---------+--------+ +---------+------------------+-----+---------+-------+ Left     Lt Pressure (mmHg)IndexWaveform Comment +---------+------------------+-----+---------+-------+ Brachial 130                    triphasic        +---------+------------------+-----+---------+-------+ PTA      163               1.25 triphasic        +---------+------------------+-----+---------+-------+ DP       151               1.16 triphasic        +---------+------------------+-----+---------+-------+ Galvin Proffer               0.95 Normal           +---------+------------------+-----+---------+-------+ Arterial wall calcification precludes accurate ankle pressures and ABIs.  Right Doppler Findings: +--------+--------+-----+---------+--------+ Site    PressureIndexDoppler  Comments +--------+--------+-----+---------+--------+ [id number]          triphasic         +--------+--------+-----+---------+--------+ Radial               triphasic         +--------+--------+-----+---------+--------+ Ulnar                triphasic         +--------+--------+-----+---------+--------+  Left Doppler Findings: +--------+--------+-----+---------+--------+ Site    PressureIndexDoppler  Comments +--------+--------+-----+---------+--------+ Brachial130          triphasic         +--------+--------+-----+---------+--------+ Radial               triphasic         +--------+--------+-----+---------+--------+ Ulnar                triphasic         +--------+--------+-----+---------+--------+  Summary: Right Carotid: Velocities in the right ICA are consistent with a 1-39% stenosis. Left  Carotid: Velocities in the left ICA are consistent with a 1-39% stenosis. Vertebrals:  Bilateral vertebral arteries demonstrate antegrade flow. Subclavians: Normal flow hemodynamics were seen in bilateral subclavian              arteries. Right ABI: Resting right ankle-brachial index indicates noncompressible right lower extremity arteries. Left ABI: Resting left ankle-brachial index is within normal range. However, arterial calcification may preclude accurate pressures. Right Upper Extremity: Doppler waveforms remain within normal limits with right radial compression. Doppler waveforms remain within normal limits with right ulnar compression. Left Upper Extremity: Doppler waveforms remain within normal limits with left radial compression. Doppler waveforms remain within normal limits with left ulnar compression.  Electronically signed by Harold Barban MD on 01/21/2023 at 10:03:57 PM.    Final    CT HEAD WO CONTRAST (5MM)  Result Date: 01/21/2023 CLINICAL DATA:  Initial evaluation for headache, left-sided neuro deficits. EXAM: CT HEAD WITHOUT CONTRAST TECHNIQUE: Contiguous axial images were obtained from the base of the skull through the vertex without intravenous contrast. RADIATION DOSE REDUCTION: This exam was performed according to the departmental dose-optimization program which includes automated exposure control, adjustment of the mA and/or kV according to patient size and/or use of iterative reconstruction technique. COMPARISON:  None Available. FINDINGS: Brain: Age-related cerebral atrophy with chronic small vessel ischemic disease. No acute intracranial hemorrhage. No acute large vessel territory infarct. No mass lesion, midline shift or mass effect. No hydrocephalus or extra-axial fluid collection. Vascular: No abnormal hyperdense vessel. Scattered vascular calcifications noted within the carotid siphons. Skull: Scalp soft tissues demonstrate no acute finding. Calvarium intact. Small osteoma noted at the  left frontal calvarium. Sinuses/Orbits: Globes orbital soft tissues demonstrate no acute finding. Paranasal sinuses are clear. No mastoid effusion. Other: None. IMPRESSION: 1. No acute intracranial abnormality. 2. Age-related cerebral atrophy with chronic small vessel ischemic disease. Electronically Signed   By: Jeannine Boga M.D.   On: 01/21/2023 21:05   CARDIAC CATHETERIZATION  Result Date: 01/20/2023   Prox RCA lesion is 45% stenosed.   Prox LAD lesion is 35% stenosed.   2nd Diag lesion is 50% stenosed.   Mid LAD lesion is 85% stenosed.   Ramus lesion is 75% stenosed.   1st Mrg lesion is 90% stenosed.   2nd Mrg lesion is 99% stenosed.   LV end diastolic pressure is mildly elevated. Complex 2 vessel obstructive CAD with heavily calcified vessels. Mildly elevated LVEDP 16 mm Hg Plan; review with heart team approach. Will consult CT surgery. If not felt to be a candidate for surgery could consider PCI with atherectomy of LAD/OM. Need to initiate guideline directed medical therapy.    Cardiac Studies   Cath 01/20/23 Complex 2 vessel obstructive CAD with heavily calcified vessels. Mildly elevated LVEDP 16 mm Hg  TTE 01/19/23: Complex 2 vessel obstructive CAD with heavily calcified vessels. Mildly elevated LVEDP 16 mm Hg  Patient Profile     77 y.o. male  with a hx of DM2, bipolar, COPD presents with UA>>MVD  Assessment & Plan    Unstable angina:  Cont ASA, atorva 80, start Toprol XL 25 QPM. MVD:  OR tomorrow T2DM:  Would benefit from SGLT2 inhibitor; consider later HL:  Cont atorvastatin 80 Blurry vision:  MRI negative; will follow up CTA and depending on findings, involve neuro.  My sense is this may be visual phenomena from the IV contrast  For questions or  updates, please contact [place] Please consult [email or web address] for contact info under        Signed, Early Osmond, MD  01/22/2023, 8:22 AM

## 2023-01-23 ENCOUNTER — Inpatient Hospital Stay (HOSPITAL_COMMUNITY): Payer: Medicare Other | Admitting: Certified Registered Nurse Anesthetist

## 2023-01-23 ENCOUNTER — Inpatient Hospital Stay (HOSPITAL_COMMUNITY): Payer: Medicare Other

## 2023-01-23 ENCOUNTER — Other Ambulatory Visit: Payer: Self-pay

## 2023-01-23 ENCOUNTER — Inpatient Hospital Stay (HOSPITAL_COMMUNITY)
Admission: EM | Disposition: A | Discharge status: Home / Self Care | Payer: Self-pay | Source: Home / Self Care | Attending: Surgery

## 2023-01-23 DIAGNOSIS — Z951 Presence of aortocoronary bypass graft: Secondary | ICD-10-CM

## 2023-01-23 DIAGNOSIS — I10 Essential (primary) hypertension: Secondary | ICD-10-CM | POA: Diagnosis not present

## 2023-01-23 DIAGNOSIS — Z87891 Personal history of nicotine dependence: Secondary | ICD-10-CM

## 2023-01-23 DIAGNOSIS — I2510 Atherosclerotic heart disease of native coronary artery without angina pectoris: Secondary | ICD-10-CM | POA: Diagnosis not present

## 2023-01-23 DIAGNOSIS — J449 Chronic obstructive pulmonary disease, unspecified: Secondary | ICD-10-CM | POA: Diagnosis not present

## 2023-01-23 HISTORY — PX: ENDOVEIN HARVEST OF GREATER SAPHENOUS VEIN: SHX5059

## 2023-01-23 HISTORY — PX: CORONARY ARTERY BYPASS GRAFT: SHX141

## 2023-01-23 HISTORY — PX: TEE WITHOUT CARDIOVERSION: SHX5443

## 2023-01-23 LAB — CBC
HCT: 36.9 % — ABNORMAL LOW (ref 39.0–52.0)
HCT: 28.7 % — ABNORMAL LOW (ref 39.0–52.0)
HCT: 28.8 % — ABNORMAL LOW (ref 39.0–52.0)
Hemoglobin: 12.3 g/dL — ABNORMAL LOW (ref 13.0–17.0)
Hemoglobin: 9.8 g/dL — ABNORMAL LOW (ref 13.0–17.0)
Hemoglobin: 10 g/dL — ABNORMAL LOW (ref 13.0–17.0)
MCH: 32.4 pg (ref 26.0–34.0)
MCH: 33.6 pg (ref 26.0–34.0)
MCH: 33.7 pg (ref 26.0–34.0)
MCHC: 33.3 g/dL (ref 30.0–36.0)
MCHC: 34.1 g/dL (ref 30.0–36.0)
MCHC: 34.7 g/dL (ref 30.0–36.0)
MCV: 97.1 fL (ref 80.0–100.0)
MCV: 98.3 fL (ref 80.0–100.0)
MCV: 97 fL (ref 80.0–100.0)
Platelets: 189 10*3/uL (ref 150–400)
Platelets: 144 10*3/uL — ABNORMAL LOW (ref 150–400)
Platelets: 145 10*3/uL — ABNORMAL LOW (ref 150–400)
RBC: 3.8 MIL/uL — ABNORMAL LOW (ref 4.22–5.81)
RBC: 2.92 MIL/uL — ABNORMAL LOW (ref 4.22–5.81)
RBC: 2.97 MIL/uL — ABNORMAL LOW (ref 4.22–5.81)
RDW: 13.2 % (ref 11.5–15.5)
RDW: 13.3 % (ref 11.5–15.5)
RDW: 13.6 % (ref 11.5–15.5)
WBC: 9.2 10*3/uL (ref 4.0–10.5)
WBC: 13.3 10*3/uL — ABNORMAL HIGH (ref 4.0–10.5)
WBC: 14.2 10*3/uL — ABNORMAL HIGH (ref 4.0–10.5)
nRBC: 0 % (ref 0.0–0.2)
nRBC: 0 % (ref 0.0–0.2)
nRBC: 0 % (ref 0.0–0.2)

## 2023-01-23 LAB — BASIC METABOLIC PANEL
Anion gap: 7 (ref 5–15)
BUN: 18 mg/dL (ref 8–23)
CO2: 23 mmol/L (ref 22–32)
Calcium: 7.6 mg/dL — ABNORMAL LOW (ref 8.9–10.3)
Chloride: 103 mmol/L (ref 98–111)
Creatinine, Ser: 0.71 mg/dL (ref 0.61–1.24)
GFR, Estimated: >60 mL/min (ref >60)
Glucose, Bld: 169 mg/dL — ABNORMAL HIGH (ref 70–99)
Potassium: 4.2 mmol/L (ref 3.5–5.1)
Sodium: 133 mmol/L — ABNORMAL LOW (ref 135–145)

## 2023-01-23 LAB — POCT I-STAT, CHEM 8
BUN: 24 mg/dL — ABNORMAL HIGH (ref 8–23)
BUN: 23 mg/dL (ref 8–23)
BUN: 24 mg/dL — ABNORMAL HIGH (ref 8–23)
BUN: 21 mg/dL (ref 8–23)
BUN: 20 mg/dL (ref 8–23)
Calcium, Ion: 1.22 mmol/L (ref 1.15–1.40)
Calcium, Ion: 1.08 mmol/L — ABNORMAL LOW (ref 1.15–1.40)
Calcium, Ion: 1.21 mmol/L (ref 1.15–1.40)
Calcium, Ion: 1.08 mmol/L — ABNORMAL LOW (ref 1.15–1.40)
Calcium, Ion: 1.12 mmol/L — ABNORMAL LOW (ref 1.15–1.40)
Chloride: 101 mmol/L (ref 98–111)
Chloride: 100 mmol/L (ref 98–111)
Chloride: 101 mmol/L (ref 98–111)
Chloride: 100 mmol/L (ref 98–111)
Chloride: 101 mmol/L (ref 98–111)
Creatinine, Ser: 0.6 mg/dL — ABNORMAL LOW (ref 0.61–1.24)
Creatinine, Ser: 0.6 mg/dL — ABNORMAL LOW (ref 0.61–1.24)
Creatinine, Ser: 0.7 mg/dL (ref 0.61–1.24)
Creatinine, Ser: 0.5 mg/dL — ABNORMAL LOW (ref 0.61–1.24)
Creatinine, Ser: 0.6 mg/dL — ABNORMAL LOW (ref 0.61–1.24)
Glucose, Bld: 235 mg/dL — ABNORMAL HIGH (ref 70–99)
Glucose, Bld: 189 mg/dL — ABNORMAL HIGH (ref 70–99)
Glucose, Bld: 219 mg/dL — ABNORMAL HIGH (ref 70–99)
Glucose, Bld: 187 mg/dL — ABNORMAL HIGH (ref 70–99)
Glucose, Bld: 200 mg/dL — ABNORMAL HIGH (ref 70–99)
HCT: 34 % — ABNORMAL LOW (ref 39.0–52.0)
HCT: 25 % — ABNORMAL LOW (ref 39.0–52.0)
HCT: 31 % — ABNORMAL LOW (ref 39.0–52.0)
HCT: 22 % — ABNORMAL LOW (ref 39.0–52.0)
HCT: 28 % — ABNORMAL LOW (ref 39.0–52.0)
Hemoglobin: 11.6 g/dL — ABNORMAL LOW (ref 13.0–17.0)
Hemoglobin: 10.5 g/dL — ABNORMAL LOW (ref 13.0–17.0)
Hemoglobin: 8.5 g/dL — ABNORMAL LOW (ref 13.0–17.0)
Hemoglobin: 7.5 g/dL — ABNORMAL LOW (ref 13.0–17.0)
Hemoglobin: 9.5 g/dL — ABNORMAL LOW (ref 13.0–17.0)
Potassium: 3.9 mmol/L (ref 3.5–5.1)
Potassium: 4.2 mmol/L (ref 3.5–5.1)
Potassium: 5.5 mmol/L — ABNORMAL HIGH (ref 3.5–5.1)
Potassium: 5.3 mmol/L — ABNORMAL HIGH (ref 3.5–5.1)
Potassium: 4.4 mmol/L (ref 3.5–5.1)
Sodium: 136 mmol/L (ref 135–145)
Sodium: 133 mmol/L — ABNORMAL LOW (ref 135–145)
Sodium: 136 mmol/L (ref 135–145)
Sodium: 133 mmol/L — ABNORMAL LOW (ref 135–145)
Sodium: 136 mmol/L (ref 135–145)
TCO2: 27 mmol/L (ref 22–32)
TCO2: 25 mmol/L (ref 22–32)
TCO2: 25 mmol/L (ref 22–32)
TCO2: 25 mmol/L (ref 22–32)
TCO2: 24 mmol/L (ref 22–32)

## 2023-01-23 LAB — POCT I-STAT 7, (LYTES, BLD GAS, ICA,H+H)
Acid-base deficit: 1 mmol/L (ref 0.0–2.0)
Acid-base deficit: 2 mmol/L (ref 0.0–2.0)
Acid-base deficit: 2 mmol/L (ref 0.0–2.0)
Acid-base deficit: 2 mmol/L (ref 0.0–2.0)
Acid-base deficit: 2 mmol/L (ref 0.0–2.0)
Bicarbonate: 24.4 mmol/L (ref 20.0–28.0)
Bicarbonate: 23.9 mmol/L (ref 20.0–28.0)
Bicarbonate: 25.2 mmol/L (ref 20.0–28.0)
Bicarbonate: 23.7 mmol/L (ref 20.0–28.0)
Bicarbonate: 23.4 mmol/L (ref 20.0–28.0)
Calcium, Ion: 1.01 mmol/L — ABNORMAL LOW (ref 1.15–1.40)
Calcium, Ion: 1.14 mmol/L — ABNORMAL LOW (ref 1.15–1.40)
Calcium, Ion: 1.18 mmol/L (ref 1.15–1.40)
Calcium, Ion: 1.15 mmol/L (ref 1.15–1.40)
Calcium, Ion: 1.12 mmol/L — ABNORMAL LOW (ref 1.15–1.40)
HCT: 25 % — ABNORMAL LOW (ref 39.0–52.0)
HCT: 26 % — ABNORMAL LOW (ref 39.0–52.0)
HCT: 26 % — ABNORMAL LOW (ref 39.0–52.0)
HCT: 28 % — ABNORMAL LOW (ref 39.0–52.0)
HCT: 28 % — ABNORMAL LOW (ref 39.0–52.0)
Hemoglobin: 8.5 g/dL — ABNORMAL LOW (ref 13.0–17.0)
Hemoglobin: 8.8 g/dL — ABNORMAL LOW (ref 13.0–17.0)
Hemoglobin: 8.8 g/dL — ABNORMAL LOW (ref 13.0–17.0)
Hemoglobin: 9.5 g/dL — ABNORMAL LOW (ref 13.0–17.0)
Hemoglobin: 9.5 g/dL — ABNORMAL LOW (ref 13.0–17.0)
O2 Saturation: 100 %
O2 Saturation: 100 %
O2 Saturation: 99 %
O2 Saturation: 99 %
O2 Saturation: 98 %
Patient temperature: 36
Patient temperature: 35.8
Patient temperature: 37
Potassium: 4.4 mmol/L (ref 3.5–5.1)
Potassium: 4.4 mmol/L (ref 3.5–5.1)
Potassium: 4 mmol/L (ref 3.5–5.1)
Potassium: 4 mmol/L (ref 3.5–5.1)
Potassium: 4.3 mmol/L (ref 3.5–5.1)
Sodium: 136 mmol/L (ref 135–145)
Sodium: 136 mmol/L (ref 135–145)
Sodium: 136 mmol/L (ref 135–145)
Sodium: 136 mmol/L (ref 135–145)
Sodium: 136 mmol/L (ref 135–145)
TCO2: 26 mmol/L (ref 22–32)
TCO2: 25 mmol/L (ref 22–32)
TCO2: 27 mmol/L (ref 22–32)
TCO2: 25 mmol/L (ref 22–32)
TCO2: 25 mmol/L (ref 22–32)
pCO2 arterial: 44.4 mmHg (ref 32–48)
pCO2 arterial: 45.6 mmHg (ref 32–48)
pCO2 arterial: 50.2 mmHg — ABNORMAL HIGH (ref 32–48)
pCO2 arterial: 39.7 mmHg (ref 32–48)
pCO2 arterial: 39.6 mmHg (ref 32–48)
pH, Arterial: 7.348 — ABNORMAL LOW (ref 7.35–7.45)
pH, Arterial: 7.328 — ABNORMAL LOW (ref 7.35–7.45)
pH, Arterial: 7.304 — ABNORMAL LOW (ref 7.35–7.45)
pH, Arterial: 7.379 (ref 7.35–7.45)
pH, Arterial: 7.379 (ref 7.35–7.45)
pO2, Arterial: 395 mmHg — ABNORMAL HIGH (ref 83–108)
pO2, Arterial: 209 mmHg — ABNORMAL HIGH (ref 83–108)
pO2, Arterial: 125 mmHg — ABNORMAL HIGH (ref 83–108)
pO2, Arterial: 150 mmHg — ABNORMAL HIGH (ref 83–108)
pO2, Arterial: 112 mmHg — ABNORMAL HIGH (ref 83–108)

## 2023-01-23 LAB — URINALYSIS, ROUTINE W REFLEX MICROSCOPIC
Bacteria, UA: NONE SEEN
Bilirubin Urine: NEGATIVE
Glucose, UA: >=500 mg/dL — AB
Hgb urine dipstick: NEGATIVE
Ketones, ur: NEGATIVE mg/dL
Leukocytes,Ua: NEGATIVE
Nitrite: NEGATIVE
Protein, ur: NEGATIVE mg/dL
Specific Gravity, Urine: 1.023 (ref 1.005–1.030)
pH: 5 (ref 5.0–8.0)

## 2023-01-23 LAB — BLOOD GAS, ARTERIAL
Acid-Base Excess: 0.5 mmol/L (ref 0.0–2.0)
Bicarbonate: 24.6 mmol/L (ref 20.0–28.0)
O2 Saturation: 94.6 %
Patient temperature: 36.4
pCO2 arterial: 36 mmHg (ref 32–48)
pH, Arterial: 7.44 (ref 7.35–7.45)
pO2, Arterial: 75 mmHg — ABNORMAL LOW (ref 83–108)

## 2023-01-23 LAB — COMPREHENSIVE METABOLIC PANEL
ALT: 71 U/L — ABNORMAL HIGH (ref 0–44)
AST: 51 U/L — ABNORMAL HIGH (ref 15–41)
Albumin: 3 g/dL — ABNORMAL LOW (ref 3.5–5.0)
Alkaline Phosphatase: 67 U/L (ref 38–126)
Anion gap: 13 (ref 5–15)
BUN: 29 mg/dL — ABNORMAL HIGH (ref 8–23)
CO2: 21 mmol/L — ABNORMAL LOW (ref 22–32)
Calcium: 8.9 mg/dL (ref 8.9–10.3)
Chloride: 100 mmol/L (ref 98–111)
Creatinine, Ser: 1.06 mg/dL (ref 0.61–1.24)
GFR, Estimated: >60 mL/min (ref >60)
Glucose, Bld: 262 mg/dL — ABNORMAL HIGH (ref 70–99)
Potassium: 4.3 mmol/L (ref 3.5–5.1)
Sodium: 134 mmol/L — ABNORMAL LOW (ref 135–145)
Total Bilirubin: 0.4 mg/dL (ref 0.3–1.2)
Total Protein: 6.3 g/dL — ABNORMAL LOW (ref 6.5–8.1)

## 2023-01-23 LAB — POCT I-STAT EG7
Acid-base deficit: 1 mmol/L (ref 0.0–2.0)
Bicarbonate: 25 mmol/L (ref 20.0–28.0)
Calcium, Ion: 1.08 mmol/L — ABNORMAL LOW (ref 1.15–1.40)
HCT: 25 % — ABNORMAL LOW (ref 39.0–52.0)
Hemoglobin: 8.5 g/dL — ABNORMAL LOW (ref 13.0–17.0)
O2 Saturation: 70 %
Potassium: 6.6 mmol/L (ref 3.5–5.1)
Sodium: 136 mmol/L (ref 135–145)
TCO2: 27 mmol/L (ref 22–32)
pCO2, Ven: 49.3 mmHg (ref 44–60)
pH, Ven: 7.314 (ref 7.25–7.43)
pO2, Ven: 40 mmHg (ref 32–45)

## 2023-01-23 LAB — PLATELET COUNT: Platelets: 122 10*3/uL — ABNORMAL LOW (ref 150–400)

## 2023-01-23 LAB — GLUCOSE, CAPILLARY
Glucose-Capillary: 284 mg/dL — ABNORMAL HIGH (ref 70–99)
Glucose-Capillary: 178 mg/dL — ABNORMAL HIGH (ref 70–99)
Glucose-Capillary: 142 mg/dL — ABNORMAL HIGH (ref 70–99)
Glucose-Capillary: 161 mg/dL — ABNORMAL HIGH (ref 70–99)
Glucose-Capillary: 112 mg/dL — ABNORMAL HIGH (ref 70–99)
Glucose-Capillary: 117 mg/dL — ABNORMAL HIGH (ref 70–99)
Glucose-Capillary: 139 mg/dL — ABNORMAL HIGH (ref 70–99)
Glucose-Capillary: 167 mg/dL — ABNORMAL HIGH (ref 70–99)
Glucose-Capillary: 167 mg/dL — ABNORMAL HIGH (ref 70–99)
Glucose-Capillary: 164 mg/dL — ABNORMAL HIGH (ref 70–99)

## 2023-01-23 LAB — SURGICAL PCR SCREEN
MRSA, PCR: NEGATIVE
Staphylococcus aureus: NEGATIVE

## 2023-01-23 LAB — MAGNESIUM
Magnesium: 2.1 mg/dL (ref 1.7–2.4)
Magnesium: 2.5 mg/dL — ABNORMAL HIGH (ref 1.7–2.4)

## 2023-01-23 LAB — PROTIME-INR
INR: 1.4 — ABNORMAL HIGH (ref 0.8–1.2)
Prothrombin Time: 16.6 seconds — ABNORMAL HIGH (ref 11.4–15.2)

## 2023-01-23 LAB — HEMOGLOBIN AND HEMATOCRIT, BLOOD
HCT: 23.2 % — ABNORMAL LOW (ref 39.0–52.0)
Hemoglobin: 8.1 g/dL — ABNORMAL LOW (ref 13.0–17.0)

## 2023-01-23 LAB — APTT: aPTT: 31 seconds (ref 24–36)

## 2023-01-23 SURGERY — CORONARY ARTERY BYPASS GRAFTING (CABG)
Anesthesia: General | Site: Chest | Laterality: Right

## 2023-01-23 MED ORDER — ACETAMINOPHEN 650 MG RE SUPP
650.0000 mg | Freq: Once | RECTAL | Status: AC
  Administered 2023-01-23: 650 mg via RECTAL

## 2023-01-23 MED ORDER — ACETAMINOPHEN 160 MG/5ML PO SOLN: 1000.0000 mg | Freq: Four times a day (QID) | ORAL | Status: DC

## 2023-01-23 MED ORDER — MIDAZOLAM HCL 2 MG/2ML IJ SOLN: 2.0000 mg | INTRAMUSCULAR | Status: DC | PRN

## 2023-01-23 MED ORDER — LACTATED RINGERS IV SOLN: 500.0000 mL | Freq: Once | INTRAVENOUS | Status: DC | PRN

## 2023-01-23 MED ORDER — FENTANYL CITRATE (PF) 250 MCG/5ML IJ SOLN
INTRAMUSCULAR | Status: AC
  Filled 2023-01-23: qty 5

## 2023-01-23 MED ORDER — CHLORHEXIDINE GLUCONATE 0.12 % MT SOLN
15.0000 mL | OROMUCOSAL | Status: AC
  Administered 2023-01-23: 15 mL via OROMUCOSAL
  Filled 2023-01-23: qty 15

## 2023-01-23 MED ORDER — LACTATED RINGERS IV SOLN: INTRAVENOUS | Status: DC | PRN

## 2023-01-23 MED ORDER — MAGNESIUM SULFATE 4 GM/100ML IV SOLN
4.0000 g | Freq: Once | INTRAVENOUS | Status: AC
  Administered 2023-01-23: 4 g via INTRAVENOUS
  Filled 2023-01-23: qty 100

## 2023-01-23 MED ORDER — PLASMA-LYTE A IV SOLN: INTRAVENOUS | Status: DC | PRN

## 2023-01-23 MED ORDER — DEXMEDETOMIDINE HCL IN NACL 400 MCG/100ML IV SOLN
0.0000 ug/kg/h | INTRAVENOUS | Status: DC
  Administered 2023-01-23: 0.4 ug/kg/h via INTRAVENOUS
  Administered 2023-01-24: 0.7 ug/kg/h via INTRAVENOUS
  Filled 2023-01-23 (×2): qty 100

## 2023-01-23 MED ORDER — ALBUMIN HUMAN 5 % IV SOLN: INTRAVENOUS | Status: DC | PRN

## 2023-01-23 MED ORDER — CLEVIDIPINE BUTYRATE 0.5 MG/ML IV EMUL
INTRAVENOUS | Status: DC | PRN
  Administered 2023-01-23: 1 mg/h via INTRAVENOUS

## 2023-01-23 MED ORDER — MIDAZOLAM HCL (PF) 10 MG/2ML IJ SOLN
INTRAMUSCULAR | Status: AC
  Filled 2023-01-23: qty 2

## 2023-01-23 MED ORDER — SODIUM CHLORIDE 0.9 % IV SOLN: INTRAVENOUS | Status: DC

## 2023-01-23 MED ORDER — ACETAMINOPHEN 500 MG PO TABS
1000.0000 mg | ORAL_TABLET | Freq: Four times a day (QID) | ORAL | Status: DC
  Administered 2023-01-23 – 2023-01-28 (×16): 1000 mg via ORAL
  Filled 2023-01-23 (×17): qty 2

## 2023-01-23 MED ORDER — POTASSIUM CHLORIDE 10 MEQ/50ML IV SOLN: 10.0000 meq | INTRAVENOUS | Status: AC

## 2023-01-23 MED ORDER — CHLORHEXIDINE GLUCONATE CLOTH 2 % EX PADS
6.0000 | MEDICATED_PAD | Freq: Every day | CUTANEOUS | Status: DC
  Administered 2023-01-23 – 2023-01-25 (×3): 6 via TOPICAL

## 2023-01-23 MED ORDER — DEXTROSE 50 % IV SOLN: 0.0000 mL | INTRAVENOUS | Status: DC | PRN

## 2023-01-23 MED ORDER — MIDAZOLAM HCL (PF) 5 MG/ML IJ SOLN
INTRAMUSCULAR | Status: DC | PRN
  Administered 2023-01-23 (×3): 2 mg via INTRAVENOUS

## 2023-01-23 MED ORDER — SODIUM CHLORIDE 0.9% FLUSH: 3.0000 mL | INTRAVENOUS | Status: DC | PRN

## 2023-01-23 MED ORDER — SODIUM CHLORIDE 0.45 % IV SOLN: INTRAVENOUS | Status: DC | PRN

## 2023-01-23 MED ORDER — TRAMADOL HCL 50 MG PO TABS
50.0000 mg | ORAL_TABLET | ORAL | Status: DC | PRN
  Administered 2023-01-23 – 2023-01-25 (×2): 100 mg via ORAL
  Filled 2023-01-23 (×2): qty 2

## 2023-01-23 MED ORDER — DOCUSATE SODIUM 100 MG PO CAPS
200.0000 mg | ORAL_CAPSULE | Freq: Every day | ORAL | Status: DC
  Administered 2023-01-24 – 2023-01-26 (×3): 200 mg via ORAL
  Filled 2023-01-23 (×3): qty 2

## 2023-01-23 MED ORDER — LACTATED RINGERS IV SOLN: INTRAVENOUS | Status: DC

## 2023-01-23 MED ORDER — PANTOPRAZOLE SODIUM 40 MG PO TBEC
40.0000 mg | DELAYED_RELEASE_TABLET | Freq: Every day | ORAL | Status: DC
  Administered 2023-01-25 – 2023-01-28 (×4): 40 mg via ORAL
  Filled 2023-01-23 (×4): qty 1

## 2023-01-23 MED ORDER — INSULIN REGULAR(HUMAN) IN NACL 100-0.9 UT/100ML-% IV SOLN
INTRAVENOUS | Status: DC
  Filled 2023-01-23: qty 100

## 2023-01-23 MED ORDER — HEMOSTATIC AGENTS (NO CHARGE) OPTIME
TOPICAL | Status: DC | PRN
  Administered 2023-01-23: 3 via TOPICAL
  Administered 2023-01-23: 1 via TOPICAL

## 2023-01-23 MED ORDER — OXYCODONE HCL 5 MG PO TABS
5.0000 mg | ORAL_TABLET | ORAL | Status: DC | PRN
  Administered 2023-01-23 – 2023-01-25 (×7): 10 mg via ORAL
  Filled 2023-01-23 (×7): qty 2

## 2023-01-23 MED ORDER — MIDAZOLAM HCL 2 MG/2ML IJ SOLN
INTRAMUSCULAR | Status: AC
  Filled 2023-01-23: qty 2

## 2023-01-23 MED ORDER — COLCHICINE 0.6 MG PO TABS
0.6000 mg | ORAL_TABLET | Freq: Every day | ORAL | Status: DC
  Administered 2023-01-24 – 2023-01-28 (×5): 0.6 mg via ORAL
  Filled 2023-01-23 (×5): qty 1

## 2023-01-23 MED ORDER — SODIUM CHLORIDE 0.9% FLUSH
3.0000 mL | Freq: Two times a day (BID) | INTRAVENOUS | Status: DC
  Administered 2023-01-24 – 2023-01-26 (×5): 3 mL via INTRAVENOUS

## 2023-01-23 MED ORDER — VANCOMYCIN HCL IN DEXTROSE 1-5 GM/200ML-% IV SOLN
1000.0000 mg | Freq: Once | INTRAVENOUS | Status: AC
  Administered 2023-01-23: 1000 mg via INTRAVENOUS
  Filled 2023-01-23: qty 200

## 2023-01-23 MED ORDER — THROMBIN (RECOMBINANT) 20000 UNITS EX SOLR
CUTANEOUS | Status: AC
  Filled 2023-01-23: qty 20000

## 2023-01-23 MED ORDER — ORAL CARE MOUTH RINSE: 15.0000 mL | OROMUCOSAL | Status: DC | PRN

## 2023-01-23 MED ORDER — PROPOFOL 10 MG/ML IV BOLUS
INTRAVENOUS | Status: DC | PRN
  Administered 2023-01-23 (×2): 100 mg via INTRAVENOUS

## 2023-01-23 MED ORDER — HEPARIN SODIUM (PORCINE) 1000 UNIT/ML IJ SOLN
INTRAMUSCULAR | Status: DC | PRN
  Administered 2023-01-23: 27000 [IU] via INTRAVENOUS

## 2023-01-23 MED ORDER — ALBUMIN HUMAN 5 % IV SOLN
250.0000 mL | INTRAVENOUS | Status: AC | PRN
  Administered 2023-01-23 (×2): 12.5 g via INTRAVENOUS

## 2023-01-23 MED ORDER — NITROGLYCERIN IN D5W 200-5 MCG/ML-% IV SOLN
0.0000 ug/min | INTRAVENOUS | Status: DC
  Administered 2023-01-23: 5 ug/min via INTRAVENOUS
  Filled 2023-01-23: qty 250

## 2023-01-23 MED ORDER — BISACODYL 10 MG RE SUPP: 10.0000 mg | Freq: Every day | RECTAL | Status: DC

## 2023-01-23 MED ORDER — PROPOFOL 10 MG/ML IV BOLUS
INTRAVENOUS | Status: AC
  Filled 2023-01-23: qty 20

## 2023-01-23 MED ORDER — CEFAZOLIN SODIUM-DEXTROSE 2-4 GM/100ML-% IV SOLN
2.0000 g | Freq: Three times a day (TID) | INTRAVENOUS | Status: AC
  Administered 2023-01-23 – 2023-01-25 (×6): 2 g via INTRAVENOUS
  Filled 2023-01-23 (×6): qty 100

## 2023-01-23 MED ORDER — BISACODYL 5 MG PO TBEC
10.0000 mg | DELAYED_RELEASE_TABLET | Freq: Every day | ORAL | Status: DC
  Administered 2023-01-24 – 2023-01-26 (×3): 10 mg via ORAL
  Filled 2023-01-23 (×3): qty 2

## 2023-01-23 MED ORDER — ACETAMINOPHEN 160 MG/5ML PO SOLN: 650.0000 mg | Freq: Once | ORAL | Status: AC

## 2023-01-23 MED ORDER — PHENYLEPHRINE HCL-NACL 20-0.9 MG/250ML-% IV SOLN
0.0000 ug/min | INTRAVENOUS | Status: DC
  Administered 2023-01-25: 110 ug/min via INTRAVENOUS
  Administered 2023-01-25: 75 ug/min via INTRAVENOUS
  Filled 2023-01-23 (×2): qty 250

## 2023-01-23 MED ORDER — METOPROLOL TARTRATE 25 MG/10 ML ORAL SUSPENSION: 12.5000 mg | Freq: Two times a day (BID) | ORAL | Status: DC

## 2023-01-23 MED ORDER — ASPIRIN 325 MG PO TBEC
325.0000 mg | DELAYED_RELEASE_TABLET | Freq: Every day | ORAL | Status: DC
  Administered 2023-01-24 – 2023-01-26 (×3): 325 mg via ORAL
  Filled 2023-01-23 (×3): qty 1

## 2023-01-23 MED ORDER — SODIUM CHLORIDE 0.9 % IV SOLN
250.0000 mL | INTRAVENOUS | Status: DC
  Administered 2023-01-24: 250 mL via INTRAVENOUS

## 2023-01-23 MED ORDER — MORPHINE SULFATE (PF) 2 MG/ML IV SOLN
1.0000 mg | INTRAVENOUS | Status: DC | PRN
  Administered 2023-01-23 (×3): 4 mg via INTRAVENOUS
  Administered 2023-01-23 – 2023-01-24 (×4): 2 mg via INTRAVENOUS
  Administered 2023-01-24 – 2023-01-25 (×3): 4 mg via INTRAVENOUS
  Filled 2023-01-23: qty 2
  Filled 2023-01-23 (×3): qty 1
  Filled 2023-01-23 (×2): qty 2
  Filled 2023-01-23: qty 1
  Filled 2023-01-23 (×3): qty 2

## 2023-01-23 MED ORDER — LABETALOL HCL 5 MG/ML IV SOLN
INTRAVENOUS | Status: DC | PRN
  Administered 2023-01-23: 5 mg via INTRAVENOUS

## 2023-01-23 MED ORDER — FENTANYL CITRATE (PF) 250 MCG/5ML IJ SOLN
INTRAMUSCULAR | Status: DC | PRN
  Administered 2023-01-23 (×2): 150 ug via INTRAVENOUS
  Administered 2023-01-23: 200 ug via INTRAVENOUS
  Administered 2023-01-23: 50 ug via INTRAVENOUS
  Administered 2023-01-23: 250 ug via INTRAVENOUS
  Administered 2023-01-23 (×2): 150 ug via INTRAVENOUS
  Administered 2023-01-23: 50 ug via INTRAVENOUS
  Administered 2023-01-23: 100 ug via INTRAVENOUS
  Administered 2023-01-23: 50 ug via INTRAVENOUS
  Administered 2023-01-23 (×2): 100 ug via INTRAVENOUS

## 2023-01-23 MED ORDER — ROCURONIUM BROMIDE 10 MG/ML (PF) SYRINGE
PREFILLED_SYRINGE | INTRAVENOUS | Status: DC | PRN
  Administered 2023-01-23: 70 mg via INTRAVENOUS
  Administered 2023-01-23: 80 mg via INTRAVENOUS
  Administered 2023-01-23 (×2): 50 mg via INTRAVENOUS

## 2023-01-23 MED ORDER — THROMBIN (RECOMBINANT) 20000 UNITS EX SOLR
CUTANEOUS | Status: DC | PRN
  Administered 2023-01-23: 20000 [IU] via TOPICAL

## 2023-01-23 MED ORDER — ASPIRIN 81 MG PO CHEW: 324.0000 mg | CHEWABLE_TABLET | Freq: Every day | ORAL | Status: DC

## 2023-01-23 MED ORDER — PROTAMINE SULFATE 10 MG/ML IV SOLN
INTRAVENOUS | Status: DC | PRN
  Administered 2023-01-23: 50 mg via INTRAVENOUS
  Administered 2023-01-23: 100 mg via INTRAVENOUS
  Administered 2023-01-23 (×2): 50 mg via INTRAVENOUS
  Administered 2023-01-23: 20 mg via INTRAVENOUS

## 2023-01-23 MED ORDER — PROTAMINE SULFATE 10 MG/ML IV SOLN
INTRAVENOUS | Status: AC
  Filled 2023-01-23: qty 25

## 2023-01-23 MED ORDER — FAMOTIDINE IN NACL 20-0.9 MG/50ML-% IV SOLN
20.0000 mg | Freq: Two times a day (BID) | INTRAVENOUS | Status: AC
  Administered 2023-01-23 (×2): 20 mg via INTRAVENOUS
  Filled 2023-01-23 (×2): qty 50

## 2023-01-23 MED ORDER — METOPROLOL TARTRATE 5 MG/5ML IV SOLN
2.5000 mg | INTRAVENOUS | Status: DC | PRN
  Administered 2023-01-23: 5 mg via INTRAVENOUS
  Administered 2023-01-24: 2.5 mg via INTRAVENOUS
  Administered 2023-01-25: 5 mg via INTRAVENOUS
  Filled 2023-01-23 (×3): qty 5

## 2023-01-23 MED ORDER — METOPROLOL TARTRATE 12.5 MG HALF TABLET
12.5000 mg | ORAL_TABLET | Freq: Two times a day (BID) | ORAL | Status: DC
  Administered 2023-01-24: 12.5 mg via ORAL
  Filled 2023-01-23: qty 1

## 2023-01-23 MED ORDER — CLEVIDIPINE BUTYRATE 0.5 MG/ML IV EMUL
0.0000 mg/h | INTRAVENOUS | Status: DC
  Administered 2023-01-23: 2 mg/h via INTRAVENOUS

## 2023-01-23 MED ORDER — ONDANSETRON HCL 4 MG/2ML IJ SOLN
4.0000 mg | Freq: Four times a day (QID) | INTRAMUSCULAR | Status: DC | PRN
  Administered 2023-01-23 – 2023-01-24 (×4): 4 mg via INTRAVENOUS
  Filled 2023-01-23 (×5): qty 2

## 2023-01-23 SURGICAL SUPPLY — 111 items
BAG DECANTER FOR FLEXI CONT (MISCELLANEOUS) ×3 IMPLANT
BLADE CLIPPER SURG (BLADE) ×3 IMPLANT
BLADE STERNUM SYSTEM 6 (BLADE) ×3 IMPLANT
BNDG ELASTIC 4X5.8 VLCR STR LF (GAUZE/BANDAGES/DRESSINGS) ×3 IMPLANT
BNDG ELASTIC 6X5.8 VLCR STR LF (GAUZE/BANDAGES/DRESSINGS) ×3 IMPLANT
BNDG GAUZE DERMACEA FLUFF 4 (GAUZE/BANDAGES/DRESSINGS) ×3 IMPLANT
CANISTER SUCT 3000ML PPV (MISCELLANEOUS) ×3 IMPLANT
CANNULA AORTIC ROOT 9FR (CANNULA) IMPLANT
CANNULA ARTERIAL NVNT 3/8 20FR (MISCELLANEOUS) IMPLANT
CANNULA MC2 2 STG 36/46 NON-V (CANNULA) IMPLANT
CANNULA VENOUS 2 STG 34/46 (CANNULA) ×3
CATH ROBINSON RED A/P 18FR (CATHETERS) ×6 IMPLANT
CATH THORACIC 28FR (CATHETERS) ×3 IMPLANT
CATH THORACIC 36FR (CATHETERS) ×3 IMPLANT
CATH THORACIC 36FR RT ANG (CATHETERS) ×3 IMPLANT
CLIP RETRACTION 3.0MM CORONARY (MISCELLANEOUS) IMPLANT
CLIP TI WIDE RED SMALL 24 (CLIP) IMPLANT
CLIP VESOCCLUDE MED 24/CT (CLIP) IMPLANT
CLIP VESOCCLUDE SM WIDE 24/CT (CLIP) IMPLANT
CONN DRAIN DUAL-IN SOMAVAC (TUBING) ×3
CONNECTOR DRAIN DUAL-IN SOMAVC (TUBING) IMPLANT
CONTAINER PROTECT SURGISLUSH (MISCELLANEOUS) ×6 IMPLANT
DERMABOND ADVANCED .7 DNX12 (GAUZE/BANDAGES/DRESSINGS) IMPLANT
DRAPE CARDIOVASCULAR INCISE (DRAPES) ×3
DRAPE SRG 135X102X78XABS (DRAPES) ×3 IMPLANT
DRAPE WARM FLUID 44X44 (DRAPES) ×3 IMPLANT
DRSG COVADERM 4X14 (GAUZE/BANDAGES/DRESSINGS) ×3 IMPLANT
ELECT CAUTERY BLADE 6.4 (BLADE) ×3 IMPLANT
ELECT REM PT RETURN 9FT ADLT (ELECTROSURGICAL) ×6
ELECTRODE REM PT RTRN 9FT ADLT (ELECTROSURGICAL) ×6 IMPLANT
FELT TEFLON 1X6 (MISCELLANEOUS) ×6 IMPLANT
GAUZE 4X4 16PLY ~~LOC~~+RFID DBL (SPONGE) ×3 IMPLANT
GAUZE SPONGE 4X4 12PLY STRL (GAUZE/BANDAGES/DRESSINGS) ×6 IMPLANT
GAUZE SPONGE 4X4 12PLY STRL LF (GAUZE/BANDAGES/DRESSINGS) IMPLANT
GLOVE BIO SURGEON STRL SZ 6 (GLOVE) IMPLANT
GLOVE BIO SURGEON STRL SZ 6.5 (GLOVE) IMPLANT
GLOVE BIO SURGEON STRL SZ7 (GLOVE) IMPLANT
GLOVE BIO SURGEON STRL SZ7.5 (GLOVE) IMPLANT
GLOVE BIOGEL PI IND STRL 6 (GLOVE) IMPLANT
GLOVE BIOGEL PI IND STRL 6.5 (GLOVE) IMPLANT
GLOVE BIOGEL PI IND STRL 7.0 (GLOVE) IMPLANT
GLOVE ORTHO TXT STRL SZ7.5 (GLOVE) IMPLANT
GLOVE SURG MICRO LTX SZ7 (GLOVE) ×6 IMPLANT
GOWN STRL REUS W/ TWL LRG LVL3 (GOWN DISPOSABLE) ×12 IMPLANT
GOWN STRL REUS W/ TWL XL LVL3 (GOWN DISPOSABLE) ×3 IMPLANT
GOWN STRL REUS W/TWL LRG LVL3 (GOWN DISPOSABLE) ×12
GOWN STRL REUS W/TWL XL LVL3 (GOWN DISPOSABLE) ×3
HEMOSTAT POWDER SURGIFOAM 1G (HEMOSTASIS) ×9 IMPLANT
HEMOSTAT SURGICEL 2X14 (HEMOSTASIS) ×3 IMPLANT
INSERT FOGARTY 61MM (MISCELLANEOUS) IMPLANT
INSERT FOGARTY XLG (MISCELLANEOUS) IMPLANT
KIT BASIN OR (CUSTOM PROCEDURE TRAY) ×3 IMPLANT
KIT CATH CPB BARTLE (MISCELLANEOUS) ×3 IMPLANT
KIT SUCTION CATH 14FR (SUCTIONS) ×3 IMPLANT
KIT TURNOVER KIT B (KITS) ×3 IMPLANT
KIT VASOVIEW HEMOPRO 2 VH 4000 (KITS) ×3 IMPLANT
LEAD PACING MYOCARDI (MISCELLANEOUS) IMPLANT
NS IRRIG 1000ML POUR BTL (IV SOLUTION) ×15 IMPLANT
PACK E OPEN HEART (SUTURE) ×3 IMPLANT
PACK OPEN HEART (CUSTOM PROCEDURE TRAY) ×3 IMPLANT
PAD ARMBOARD 7.5X6 YLW CONV (MISCELLANEOUS) ×6 IMPLANT
PAD ELECT DEFIB RADIOL ZOLL (MISCELLANEOUS) ×3 IMPLANT
PENCIL BUTTON HOLSTER BLD 10FT (ELECTRODE) ×3 IMPLANT
POSITIONER HEAD DONUT 9IN (MISCELLANEOUS) ×3 IMPLANT
PUNCH AORTIC ROTATE 4.0MM (MISCELLANEOUS) IMPLANT
PUNCH AORTIC ROTATE 4.5MM 8IN (MISCELLANEOUS) ×3 IMPLANT
PUNCH AORTIC ROTATE 5MM 8IN (MISCELLANEOUS) IMPLANT
SEALANT SURG COSEAL 8ML (VASCULAR PRODUCTS) IMPLANT
SET CARDIO DLP MULTI-PER 4-LEG (CARDIAC RHYTHM DISPOSABLE) IMPLANT
SET MPS 3-ND DEL (MISCELLANEOUS) IMPLANT
SPONGE INTESTINAL PEANUT (DISPOSABLE) IMPLANT
SPONGE T-LAP 18X18 ~~LOC~~+RFID (SPONGE) ×12 IMPLANT
SPONGE T-LAP 4X18 ~~LOC~~+RFID (SPONGE) ×6 IMPLANT
SUPPORT HEART JANKE-BARRON (MISCELLANEOUS) ×3 IMPLANT
SUT BONE WAX W31G (SUTURE) ×3 IMPLANT
SUT MNCRL AB 4-0 PS2 18 (SUTURE) IMPLANT
SUT PROLENE 3 0 SH DA (SUTURE) IMPLANT
SUT PROLENE 3 0 SH1 36 (SUTURE) ×3 IMPLANT
SUT PROLENE 4 0 RB 1 (SUTURE) ×3
SUT PROLENE 4 0 SH DA (SUTURE) IMPLANT
SUT PROLENE 4-0 RB1 .5 CRCL 36 (SUTURE) IMPLANT
SUT PROLENE 5 0 C 1 36 (SUTURE) IMPLANT
SUT PROLENE 6 0 C 1 30 (SUTURE) IMPLANT
SUT PROLENE 7 0 BV 1 (SUTURE) IMPLANT
SUT PROLENE 7 0 BV1 MDA (SUTURE) ×3 IMPLANT
SUT PROLENE 8 0 BV175 6 (SUTURE) IMPLANT
SUT SILK  1 MH (SUTURE)
SUT SILK 1 MH (SUTURE) IMPLANT
SUT SILK 2 0 SH (SUTURE) IMPLANT
SUT STEEL STERNAL CCS#1 18IN (SUTURE) IMPLANT
SUT STEEL SZ 6 DBL 3X14 BALL (SUTURE) IMPLANT
SUT VIC AB 1 CTX 36 (SUTURE) ×9
SUT VIC AB 1 CTX36XBRD ANBCTR (SUTURE) ×6 IMPLANT
SUT VIC AB 2-0 CT1 27 (SUTURE) ×3
SUT VIC AB 2-0 CT1 TAPERPNT 27 (SUTURE) IMPLANT
SUT VIC AB 2-0 CTX 27 (SUTURE) IMPLANT
SUT VIC AB 3-0 SH 27 (SUTURE)
SUT VIC AB 3-0 SH 27X BRD (SUTURE) IMPLANT
SUT VIC AB 3-0 X1 27 (SUTURE) IMPLANT
SUT VICRYL 4-0 PS2 18IN ABS (SUTURE) IMPLANT
SYSTEM SAHARA CHEST DRAIN ATS (WOUND CARE) ×3 IMPLANT
TAPE CLOTH SURG 4X10 WHT LF (GAUZE/BANDAGES/DRESSINGS) IMPLANT
TOWEL GREEN STERILE (TOWEL DISPOSABLE) ×3 IMPLANT
TOWEL GREEN STERILE FF (TOWEL DISPOSABLE) ×3 IMPLANT
TRAY FOLEY SLVR 16FR TEMP STAT (SET/KITS/TRAYS/PACK) ×3 IMPLANT
TUBE CONNECTING 12X1/4 (SUCTIONS) IMPLANT
TUBE SUCTION CARDIAC 10FR (CANNULA) IMPLANT
TUBING LAP HI FLOW INSUFFLATIO (TUBING) ×3 IMPLANT
UNDERPAD 30X36 HEAVY ABSORB (UNDERPADS AND DIAPERS) ×3 IMPLANT
WATER STERILE IRR 1000ML POUR (IV SOLUTION) ×6 IMPLANT
YANKAUER SUCT BULB TIP NO VENT (SUCTIONS) IMPLANT

## 2023-01-23 NOTE — Procedures (Signed)
Extubation Procedure Note  Patient Details:   Name: Matthew [name] DOB: [birth year] MRN: [id number]   Airway Documentation:    Vent end date: 01/23/23 Vent end time: 1823   Evaluation  O2 sats: stable throughout Complications: No apparent complications Patient did tolerate procedure well. Bilateral Breath Sounds: Clear, Diminished   Yes  Patient tolerated rapid wean. NIF -25, VC 0.72. Positive for cuff leak. Patient extubated to a 4Lpm nasal cannula. No signs of dyspnea or stridor noted. Patient achieved 669m on the Incentive Spirometer, three times. RN at bedside.    SMyrtie Neither2/08/2023, 6:39 PM

## 2023-01-23 NOTE — OR Nursing (Signed)
Made second call to ICU Charge RN to notify of progress

## 2023-01-23 NOTE — Anesthesia Preprocedure Evaluation (Signed)
Anesthesia Evaluation  Patient identified by MRN, date of birth, ID band Patient awake    Reviewed: Allergy & Precautions, H&P , NPO status , Patient's Chart, lab work & pertinent test results  Airway Mallampati: II  TM Distance: >3 FB Neck ROM: Full    Dental no notable dental hx.    Pulmonary COPD, former smoker   Pulmonary exam normal breath sounds clear to auscultation       Cardiovascular hypertension, + CAD  Normal cardiovascular exam Rhythm:Regular Rate:Normal  Left Ventricle: Left ventricular ejection fraction, by estimation, is 65  to 70%. The left ventricle has normal function. The left ventricle has no  regional wall motion abnormalities. Definity contrast agent was given IV  to delineate the left ventricular   endocardial borders. The left ventricular internal cavity size was normal  in size. There is mild left ventricular hypertrophy. Left ventricular  diastolic parameters are indeterminate. Elevated left atrial pressure.   Right Ventricle: The right ventricular size is normal. Right vetricular  wall thickness was not well visualized. Right ventricular systolic  function is normal.   Left Atrium: Left atrial size was normal in size.   Right Atrium: Right atrial size was normal in size.   Pericardium: Trivial pericardial effusion is present. The pericardial  effusion is circumferential. There is no evidence of cardiac tamponade.   Mitral Valve: The mitral valve is normal in structure. No evidence of  mitral valve regurgitation. No evidence of mitral valve stenosis.   Tricuspid Valve: The tricuspid valve is normal in structure. Tricuspid  valve regurgitation is not demonstrated. No evidence of tricuspid  stenosis.   Aortic Valve: The aortic valve is tricuspid. Aortic valve regurgitation is  not visualized. No aortic stenosis is present. Aortic valve mean gradient  measures 3.7 mmHg. Aortic valve peak gradient  measures 7.3 mmHg. Aortic  valve area, by VTI measures 2.47  cm.     Neuro/Psych negative neurological ROS  negative psych ROS   GI/Hepatic negative GI ROS, Neg liver ROS,,,  Endo/Other  negative endocrine ROS    Renal/GU negative Renal ROS  negative genitourinary   Musculoskeletal negative musculoskeletal ROS (+)    Abdominal   Peds negative pediatric ROS (+)  Hematology negative hematology ROS (+)   Anesthesia Other Findings   Reproductive/Obstetrics negative OB ROS                             Anesthesia Physical Anesthesia Plan  ASA: 3  Anesthesia Plan: General   Post-op Pain Management: Minimal or no pain anticipated   Induction: Intravenous  PONV Risk Score and Plan: 2 and Ondansetron, Dexamethasone and Treatment may vary due to age or medical condition  Airway Management Planned: Oral ETT  Additional Equipment: Arterial line, CVP, PA Cath, TEE and Ultrasound Guidance Line Placement  Intra-op Plan:   Post-operative Plan: Post-operative intubation/ventilation  Informed Consent: I have reviewed the patients History and Physical, chart, labs and discussed the procedure including the risks, benefits and alternatives for the proposed anesthesia with the patient or authorized representative who has indicated his/her understanding and acceptance.     Dental advisory given  Plan Discussed with: CRNA and Surgeon  Anesthesia Plan Comments:        Anesthesia Quick Evaluation

## 2023-01-23 NOTE — Anesthesia Procedure Notes (Signed)
Procedure Name: Intubation Date/Time: 01/23/2023 7:50 AM  Performed by: Lavell Luster, CRNAPre-anesthesia Checklist: Patient identified, Emergency Drugs available, Suction available, Patient being monitored and Timeout performed Patient Re-evaluated:Patient Re-evaluated prior to induction Oxygen Delivery Method: Circle system utilized Preoxygenation: Pre-oxygenation with 100% oxygen Induction Type: IV induction Ventilation: Mask ventilation without difficulty and Oral airway inserted - appropriate to patient size Laryngoscope Size: Mac and 4 Grade View: Grade II Tube type: Oral Tube size: 8.0 mm Number of attempts: 1 Airway Equipment and Method: Stylet Placement Confirmation: ETT inserted through vocal cords under direct vision, positive ETCO2 and breath sounds checked- equal and bilateral Secured at: 23 cm Tube secured with: Tape Dental Injury: Teeth and Oropharynx as per pre-operative assessment

## 2023-01-23 NOTE — Transfer of Care (Signed)
Immediate Anesthesia Transfer of Care Note  Patient: Matthew [name]  Procedure(s) Performed: CORONARY ARTERY BYPASS GRAFTING TIMES FOUR USING LEFT INTERNAL MAMMARY ARTERY AND RIGHT GREATER SAPHENOUS VEIN (Chest) TRANSESOPHAGEAL ECHOCARDIOGRAM ENDOVEIN HARVEST OF RIGHT GREATER SAPHENOUS VEIN (Right)  Patient Location: SICU  Anesthesia Type:General  Level of Consciousness: Patient remains intubated per anesthesia plan  Airway & Oxygen Therapy: Patient placed on Ventilator (see vital sign flow sheet for setting)  Post-op Assessment: Post -op Vital signs reviewed and stable  Post vital signs: stable  Last Vitals:  Vitals Value Taken Time  BP    Temp    Pulse    Resp    SpO2      Last Pain:  Vitals:   01/23/23 0528  TempSrc: Oral  PainSc:       Patients Stated Pain Goal: 0 ([id number])  Complications: No notable events documented.

## 2023-01-23 NOTE — Interval H&P Note (Signed)
History and Physical Interval Note:  01/23/2023 6:42 AM  Matthew [name]  has presented today for surgery, with the diagnosis of CAD.  The various methods of treatment have been discussed with the patient and family. After consideration of risks, benefits and other options for treatment, the patient has consented to  Procedure(s): CORONARY ARTERY BYPASS GRAFTING (CABG) (N/A) TRANSESOPHAGEAL ECHOCARDIOGRAM (N/A) as a surgical intervention.  The patient's history has been reviewed, patient examined, no change in status, stable for surgery.  I have reviewed the patient's chart and labs.  Questions were answered to the patient's satisfaction.     Gaye Pollack

## 2023-01-23 NOTE — Anesthesia Procedure Notes (Signed)
Anesthesia Procedure Image    

## 2023-01-23 NOTE — Progress Notes (Signed)
Pt transported to short stay. Report given to [facility], CRNA.

## 2023-01-23 NOTE — Brief Op Note (Signed)
01/19/2023 - 01/23/2023  2:30 PM  PATIENT:  Matthew [name]  77 y.o. male  PRE-OPERATIVE DIAGNOSIS:  Coronary Artery Disease  POST-OPERATIVE DIAGNOSIS:  Coronary Artery Disease  PROCEDURE:  Procedure(s): CORONARY ARTERY BYPASS GRAFTING TIMES FOUR USING LEFT INTERNAL MAMMARY ARTERY AND RIGHT GREATER SAPHENOUS VEIN (N/A) TRANSESOPHAGEAL ECHOCARDIOGRAM (N/A) ENDOVEIN HARVEST OF RIGHT GREATER SAPHENOUS VEIN (Right) Vein harvest time: 52mn Vein prep time: 179m  SURGEON:  Surgeon(s) and Role:    * Bartle, BrFernande BoydenMD - Primary  PHYSICIAN ASSISTANT: Treysean Petruzzi PA-C  ASSISTANTS: RNFA   ANESTHESIA:   general  EBL:  750 mL   BLOOD ADMINISTERED:none  DRAINS:  MEDIASTINAL AND LEFT PLEURAL    LOCAL MEDICATIONS USED:  NONE  SPECIMEN:  No Specimen  DISPOSITION OF SPECIMEN:  N/A  COUNTS:  YES  TOURNIQUET:  * No tourniquets in log *  DICTATION: .Dragon Dictation  PLAN OF CARE: Admit to inpatient   PATIENT DISPOSITION:  ICU - intubated and hemodynamically stable.   Delay start of Pharmacological VTE agent (>24hrs) due to surgical blood loss or risk of bleeding: yes  COMPLICATIONS: NO KNOWN

## 2023-01-23 NOTE — Anesthesia Procedure Notes (Signed)
Central Venous Catheter Insertion Performed by: Myrtie Soman, MD, anesthesiologist Start/End2/08/2023 6:55 AM, 01/23/2023 7:10 AM Patient location: Pre-op. Preanesthetic checklist: patient identified, IV checked, site marked, risks and benefits discussed, surgical consent, monitors and equipment checked, pre-op evaluation, timeout performed and anesthesia consent Position: Trendelenburg Lidocaine 1% used for infiltration and patient sedated Hand hygiene performed  and maximum sterile barriers used  Catheter size: 8.5 Fr PA cath was placed.Sheath introducer Procedure performed using ultrasound guided technique. Ultrasound Notes:anatomy identified, needle tip was noted to be adjacent to the nerve/plexus identified, no ultrasound evidence of intravascular and/or intraneural injection and image(s) printed for medical record Attempts: 1 Following insertion, line sutured, dressing applied and Biopatch. Post procedure assessment: blood return through all ports, free fluid flow and no air  Patient tolerated the procedure well with no immediate complications.

## 2023-01-23 NOTE — Progress Notes (Signed)
Additional patient belongings found and returned to unit by OR staff; subsequently delivered said belongings in a labelled patient belonging bag to the nurses' station of [place].

## 2023-01-23 NOTE — Progress Notes (Signed)
  Echocardiogram Echocardiogram Transesophageal has been performed.  Matthew [name] M 01/23/2023, 9:38 AM

## 2023-01-23 NOTE — Op Note (Signed)
CARDIOVASCULAR SURGERY OPERATIVE NOTE  01/23/2023  Surgeon:  Gaye Pollack, MD  First Assistant: Jadene Pierini,  PA-C:   An experienced assistant was required given the complexity of this surgery and the standard of surgical care. The assistant was needed for exposure, dissection, suctioning, retraction of delicate tissues and sutures, instrument exchange and for overall help during this procedure.   Preoperative Diagnosis:  Severe multi-vessel coronary artery disease   Postoperative Diagnosis:  Same   Procedure:  Median Sternotomy Extracorporeal circulation 3.   Coronary artery bypass grafting x 4  Left internal mammary artery graft to the LAD SVG to diagonal SVG to Ramus SVG to OM1 4.   Endoscopic vein harvest from the right leg   Anesthesia:  General Endotracheal   Clinical History/Surgical Indication:  This 77 year old gentleman with multiple cardiac risk factors as noted above reports a several month history of exertional fatigue and shortness of breath and then developed substernal chest discomfort last Friday which she initially thought was heartburn.  It persisted over the weekend and he presented to [facility] yesterday with multiple low troponins of 5, 5, 7, and 8.  Electrocardiogram showed inferior Q waves but no ischemic changes.  Given his risk factors, coronary calcifications, and ongoing chest discomfort he was transferred to [facility] and underwent catheterization today showing severe multivessel coronary disease with high-grade LAD and left circumflex system stenoses.  The RCA had mild to moderate proximal stenosis of 45% that did not appear to be flow-limiting.  Ejection fraction was normal by echocardiogram.  His vessels are heavily calcified.  CTA of the chest, abdomen, and pelvis showed the ascending aorta to be free of calcified plaque.  There was calcified plaque  in the aortic arch.  I agree that coronary bypass graft surgery is the best treatment for this patient.I discussed the operative procedure with the patient and family including alternatives, benefits and risks; including but not limited to bleeding, blood transfusion, infection, stroke, myocardial infarction, graft failure, heart block requiring a permanent pacemaker, organ dysfunction, and death.  Diana Eves understands and agrees to proceed.   Preparation:  The patient was seen in the preoperative holding area and the correct patient, correct operation were confirmed with the patient after reviewing the medical record and catheterization. The consent was signed by me. Preoperative antibiotics were given. A pulmonary arterial line and radial arterial line were placed by the anesthesia team. The patient was taken back to the operating room and positioned supine on the operating room table. After being placed under general endotracheal anesthesia by the anesthesia team a foley catheter was placed. The neck, chest, abdomen, and both legs were prepped with betadine soap and solution and draped in the usual sterile manner. A surgical time-out was taken and the correct patient and operative procedure were confirmed with the nursing and anesthesia staff.   Cardiopulmonary Bypass:  A median sternotomy was performed. The pericardium was opened in the midline. There was acute pericarditis with diffuse inflammation of the pericardial and epicardial surface with some fibrinous debris. Right ventricular function appeared normal. The ascending aorta was of normal size and had palpable calcified plaque at the origin of the innominate artery. There were no contraindications to aortic cannulation or cross-clamping. The patient was fully systemically heparinized and the ACT was maintained > 400 sec. The proximal aortic arch was cannulated with a 20 F aortic cannula for arterial inflow. Venous cannulation was performed  via the right atrial appendage using a two-staged venous  cannula. An antegrade cardioplegia/vent cannula was inserted into the mid-ascending aorta. Aortic occlusion was performed with a single cross-clamp. Systemic cooling to 32 degrees Centigrade and topical cooling of the heart with iced saline were used. Hyperkalemic antegrade cold blood cardioplegia was used to induce diastolic arrest and was then given at about 20 minute intervals throughout the period of arrest to maintain myocardial temperature at or below 10 degrees centigrade. A temperature probe was inserted into the interventricular septum and an insulating pad was placed in the pericardium.   Left internal mammary artery harvest:  The left side of the sternum was retracted using the Rultract retractor. The left internal mammary artery was harvested as a pedicle graft. All side branches were clipped. It was a large-sized vessel of good quality with excellent blood flow. It was ligated distally and divided. It was sprayed with topical papaverine solution to prevent vasospasm.   Endoscopic vein harvest:  The right greater saphenous vein was harvested endoscopically through a 2 cm incision medial to the right knee. It was harvested from the upper thigh to below the knee. It was a medium-sized vein of good quality. The side branches were all ligated with 4-0 silk ties.    Coronary arteries:  The coronary arteries were examined.  LAD:  intramyocardial in the proximal and mid portions. It exited to the surface in the distal vessel which was smaller but had no  disease. The diagonal was a medium caliber vessel that was heavily calcified in the proximal and mid vessel but distally was graftable. LCX:  Ramus and OM were both deep intramyocardial and took considerable time to locate and graft. They were both large. RCA:  diffusely diseased throughout the proximal and mid vessel but only moderate proximal to mid vessel stenosis on cath. The PDA  was intramyocardial beneath the PD vein and not visible.   Grafts:  LIMA to the LAD: 1.6 mm distally. It was sewn end to side using 8-0 prolene continuous suture. SVG to diagonal:  1.6 mm. It was sewn end to side using 7-0 prolene continuous suture. SVG to Ramus:  1.75 mm. It was sewn end to side using 7-0 prolene continuous suture. SVG to OM1:  1.75 mm. It was sewn end to side using 7-0 prolene continuous suture.  The proximal vein graft anastomoses were performed to the mid-ascending aorta using continuous 6-0 prolene suture. Graft markers were placed around the proximal anastomoses.   Completion:  The patient was rewarmed to 37 degrees Centigrade. The clamp was removed from the LIMA pedicle and there was rapid warming of the septum and return of ventricular fibrillation. The crossclamp was removed with a time of 107 minutes. There was spontaneous return of sinus rhythm. The distal and proximal anastomoses were checked for hemostasis. The position of the grafts was satisfactory. Two temporary epicardial pacing wires were placed on the right atrium and two on the right ventricle. The patient was weaned from CPB without difficulty on no inotropes. CPB time was 121 minutes. Cardiac output was 5 LPM. TEE showed normal LV systolic function. Heparin was fully reversed with protamine and the aortic and venous cannulas removed. Hemostasis was achieved. Mediastinal and left pleural drainage tubes were placed. The sternum was closed with  #6 stainless steel wires. The fascia was closed with continuous # 1 vicryl suture. The subcutaneous tissue was closed with 2-0 vicryl continuous suture. The skin was closed with 3-0 vicryl subcuticular suture. All sponge, needle, and instrument counts were reported correct at the  end of the case. Dry sterile dressings were placed over the incisions and around the chest tubes which were connected to pleurevac suction. The patient was then transported to the surgical intensive  care unit in stable condition.

## 2023-01-23 NOTE — Progress Notes (Signed)
S/p CABG by CT surgery Will sign off, CT surgery now primary Please let us know when we can be of further assistance. [facility] hospitalist

## 2023-01-23 NOTE — Progress Notes (Signed)
      [address]       [place] [address]             [phone]      S/p CABG x 4  Intubated but waking up  BP 118/86 (BP Location: Right Arm)   Pulse 93   Temp (!) 96.1 F (35.6 C)   Resp 16   Ht '5\' 9"'$  (1.753 m)   Wt 77.2 kg   SpO2 97%   BMI 25.13 kg/m   15/7 CI 2.1  Neo @ 20  HCT 26, PLT 144   Intake/Output Summary (Last 24 hours) at 01/23/2023 1704 Last data filed at 01/23/2023 1600 Gross per 24 hour  Intake 4752.25 ml  Output 3631 ml  Net 1121.25 ml   Minimal CT output  Wean to extubate  Remo Lipps C. Roxan Hockey, MD [facility] [phone]

## 2023-01-23 NOTE — Hospital Course (Signed)
  PCP is [facility] Referring Provider is Dr. Martinique, MD    History of Presenting Illness: At time of cardiothoracic surgical consultation This is a 77 year old male with a past medical history of COPD, hypertension, hyperlipidemia, hypothyroidism, DM (type II), PTSD, GERD, peptic ulcer, diverticulitis, Bipolar 1 disorder, anxiety, arthritis, remote tobacco abuse, chronic back pain, and colon cancer who presented on 01/19/2023 with complaints of chest pressure and shortness of breath. EKG showed inferior Q waves but no acute ischemic changes. Initial Troponin I (high sensitivity) was 5 Echo showed LVEF 65-70% and no significant valvular disease. Cardiac catheterization  showed Ramus Intermediate with a 75 stenosis, OM1 with a 90% stenosis, OM2 with a 99% stenosis. Cardiothoracic consultation was obtained for consideration of coronary artery bypass grafting surgery.   The patient and all relevant studies were reviewed by Dr. Cyndia Bent who recommended proceeding with CABG as best long-term option due to the severity of disease and anatomical findings.  Hospital course:  The patient was medically stabilized and full diagnostic evaluation was done.  He was felt to be stable to proceed with surgery and on 01/23/2023 he was taken the operating room at which time he underwent coronary artery bypass grafting x 4.  He tolerated the procedure well was taken the surgical intensive care unit in stable condition.  Postoperative hospital course:  The patient was extubated using standard post cardiac surgical protocols without difficulty.  He has remained neurologically intact.  He initially maintained very stable hemodynamics but did have an episode of hypotension which initially did not respond to Neo-Synephrine and fluids.  It was felt that it could have been related to the vasodilatory effects of multiple of his medications.  He was able to be resuscitated without significant sequela and has continued  to remain hemodynamically stable in sinus rhythm.  These medications have been titrated lower.  All routine lines, monitors and drainage devices have been discontinued in the standard fashion.  He has maintained stable renal function.  He does have an expected postoperative volume overload but is responding well to diuretics.  He is now below his preoperative weight and diuretics have been discontinued.  He does have a expected mild acute blood loss anemia which is being monitored clinically and has not required transfusion.  He has been started on aspirin, high-dose atorvastatin, beta-blocker as well as Jardiance.  He has had some elevated blood sugars but this is felt to be possibly related to prednisone which has subsequently been discontinued.  He is to be discharged on his home diabetic regimen with primary care follow-up.  His most recent hemoglobin A1c is 6.7.  His incisions are noted to be healing well without evidence of infection.  Oxygen has been weaned and he maintains good saturations on room air.  He is tolerating gradually increasing activity using cardiac rehab modalities.  Overall, at the time of discharge the patient is felt to be quite stable.

## 2023-01-23 NOTE — Anesthesia Procedure Notes (Signed)
Arterial Line Insertion Start/End2/08/2023 7:10 AM, 01/23/2023 7:15 AM Performed by: Lavell Luster, CRNA, CRNA  Preanesthetic checklist: patient identified, IV checked, site marked, risks and benefits discussed, surgical consent and monitors and equipment checked Lidocaine 1% used for infiltration and patient sedated Left, radial was placed Catheter size: 20 G Hand hygiene performed , maximum sterile barriers used  and Seldinger technique used Allen's test indicative of satisfactory collateral circulation Attempts: 1 Procedure performed without using ultrasound guided technique. Following insertion, Biopatch and dressing applied. Post procedure assessment: normal  Patient tolerated the procedure well with no immediate complications.

## 2023-01-23 NOTE — OR Nursing (Signed)
Made first call to ICU Charge RN to notify of progress

## 2023-01-24 ENCOUNTER — Inpatient Hospital Stay (HOSPITAL_COMMUNITY): Payer: Medicare Other

## 2023-01-24 LAB — GLUCOSE, CAPILLARY
Glucose-Capillary: 107 mg/dL — ABNORMAL HIGH (ref 70–99)
Glucose-Capillary: 120 mg/dL — ABNORMAL HIGH (ref 70–99)
Glucose-Capillary: 150 mg/dL — ABNORMAL HIGH (ref 70–99)
Glucose-Capillary: 173 mg/dL — ABNORMAL HIGH (ref 70–99)
Glucose-Capillary: 174 mg/dL — ABNORMAL HIGH (ref 70–99)
Glucose-Capillary: 192 mg/dL — ABNORMAL HIGH (ref 70–99)
Glucose-Capillary: 224 mg/dL — ABNORMAL HIGH (ref 70–99)
Glucose-Capillary: 241 mg/dL — ABNORMAL HIGH (ref 70–99)
Glucose-Capillary: 92 mg/dL (ref 70–99)

## 2023-01-24 LAB — CBC
HCT: 31.1 % — ABNORMAL LOW (ref 39.0–52.0)
HCT: 32.2 % — ABNORMAL LOW (ref 39.0–52.0)
Hemoglobin: 10.3 g/dL — ABNORMAL LOW (ref 13.0–17.0)
Hemoglobin: 10.8 g/dL — ABNORMAL LOW (ref 13.0–17.0)
MCH: 32.7 pg (ref 26.0–34.0)
MCH: 32.9 pg (ref 26.0–34.0)
MCHC: 33.1 g/dL (ref 30.0–36.0)
MCHC: 33.5 g/dL (ref 30.0–36.0)
MCV: 98.7 fL (ref 80.0–100.0)
MCV: 98.2 fL (ref 80.0–100.0)
Platelets: 151 10*3/uL (ref 150–400)
Platelets: 145 10*3/uL — ABNORMAL LOW (ref 150–400)
RBC: 3.15 MIL/uL — ABNORMAL LOW (ref 4.22–5.81)
RBC: 3.28 MIL/uL — ABNORMAL LOW (ref 4.22–5.81)
RDW: 13.6 % (ref 11.5–15.5)
RDW: 13.7 % (ref 11.5–15.5)
WBC: 11.9 10*3/uL — ABNORMAL HIGH (ref 4.0–10.5)
WBC: 13.5 10*3/uL — ABNORMAL HIGH (ref 4.0–10.5)
nRBC: 0 % (ref 0.0–0.2)
nRBC: 0 % (ref 0.0–0.2)

## 2023-01-24 LAB — BASIC METABOLIC PANEL
Anion gap: 8 (ref 5–15)
Anion gap: 8 (ref 5–15)
BUN: 16 mg/dL (ref 8–23)
BUN: 12 mg/dL (ref 8–23)
CO2: 22 mmol/L (ref 22–32)
CO2: 25 mmol/L (ref 22–32)
Calcium: 7.6 mg/dL — ABNORMAL LOW (ref 8.9–10.3)
Calcium: 8 mg/dL — ABNORMAL LOW (ref 8.9–10.3)
Chloride: 102 mmol/L (ref 98–111)
Chloride: 98 mmol/L (ref 98–111)
Creatinine, Ser: 0.74 mg/dL (ref 0.61–1.24)
Creatinine, Ser: 0.69 mg/dL (ref 0.61–1.24)
GFR, Estimated: >60 mL/min (ref >60)
GFR, Estimated: >60 mL/min (ref >60)
Glucose, Bld: 177 mg/dL — ABNORMAL HIGH (ref 70–99)
Glucose, Bld: 203 mg/dL — ABNORMAL HIGH (ref 70–99)
Potassium: 4.6 mmol/L (ref 3.5–5.1)
Potassium: 4.4 mmol/L (ref 3.5–5.1)
Sodium: 132 mmol/L — ABNORMAL LOW (ref 135–145)
Sodium: 131 mmol/L — ABNORMAL LOW (ref 135–145)

## 2023-01-24 LAB — MAGNESIUM
Magnesium: 2.2 mg/dL (ref 1.7–2.4)
Magnesium: 2 mg/dL (ref 1.7–2.4)

## 2023-01-24 MED ORDER — INSULIN DETEMIR 100 UNIT/ML ~~LOC~~ SOLN
20.0000 [IU] | Freq: Every day | SUBCUTANEOUS | Status: DC
  Administered 2023-01-24 – 2023-01-25 (×2): 20 [IU] via SUBCUTANEOUS
  Filled 2023-01-24 (×3): qty 0.2

## 2023-01-24 MED ORDER — ENOXAPARIN SODIUM 40 MG/0.4ML IJ SOSY
40.0000 mg | PREFILLED_SYRINGE | Freq: Every day | INTRAMUSCULAR | Status: DC
  Administered 2023-01-24 – 2023-01-27 (×4): 40 mg via SUBCUTANEOUS
  Filled 2023-01-24 (×4): qty 0.4

## 2023-01-24 MED ORDER — INSULIN ASPART 100 UNIT/ML IJ SOLN
0.0000 [IU] | INTRAMUSCULAR | Status: DC
  Administered 2023-01-24: 2 [IU] via SUBCUTANEOUS
  Administered 2023-01-24: 8 [IU] via SUBCUTANEOUS
  Administered 2023-01-24 (×2): 4 [IU] via SUBCUTANEOUS
  Administered 2023-01-24: 8 [IU] via SUBCUTANEOUS
  Administered 2023-01-24: 4 [IU] via SUBCUTANEOUS
  Administered 2023-01-25: 2 [IU] via SUBCUTANEOUS
  Administered 2023-01-25: 4 [IU] via SUBCUTANEOUS

## 2023-01-24 MED ORDER — METOPROLOL TARTRATE 25 MG PO TABS
25.0000 mg | ORAL_TABLET | Freq: Two times a day (BID) | ORAL | Status: DC
  Administered 2023-01-24: 25 mg via ORAL
  Filled 2023-01-24 (×2): qty 1

## 2023-01-24 MED ORDER — METOPROLOL TARTRATE 25 MG/10 ML ORAL SUSPENSION: 25.0000 mg | Freq: Two times a day (BID) | ORAL | Status: DC

## 2023-01-24 MED ORDER — CYCLOBENZAPRINE HCL 10 MG PO TABS
10.0000 mg | ORAL_TABLET | Freq: Three times a day (TID) | ORAL | Status: DC
  Administered 2023-01-24 – 2023-01-26 (×7): 10 mg via ORAL
  Filled 2023-01-24 (×7): qty 1

## 2023-01-24 MED ORDER — QUETIAPINE FUMARATE 100 MG PO TABS
400.0000 mg | ORAL_TABLET | Freq: Every day | ORAL | Status: DC
  Administered 2023-01-24: 400 mg via ORAL
  Filled 2023-01-24: qty 4

## 2023-01-24 NOTE — Progress Notes (Signed)
1 Day Post-Op Procedure(s) (LRB): CORONARY ARTERY BYPASS GRAFTING TIMES FOUR USING LEFT INTERNAL MAMMARY ARTERY AND RIGHT GREATER SAPHENOUS VEIN (N/A) TRANSESOPHAGEAL ECHOCARDIOGRAM (N/A) ENDOVEIN HARVEST OF RIGHT GREATER SAPHENOUS VEIN (Right) Subjective: Didn't sleep and some confusion overnight, appropriate this Am  Objective: Vital signs in last 24 hours: Temp:  [92.8 F (33.8 C)-98.4 F (36.9 C)] 98.4 F (36.9 C) (02/10 0845) Pulse Rate:  [81-116] 103 (02/10 0845) Cardiac Rhythm: Normal sinus rhythm (02/10 0800) Resp:  [12-29] 20 (02/10 0845) BP: (74-198)/(38-136) 108/86 (02/10 0100) SpO2:  [90 %-99 %] 92 % (02/10 0845) Arterial Line BP: (95-216)/(57-124) 155/70 (02/10 0845) FiO2 (%):  [40 %-100 %] 40 % (02/09 1757) Weight:  [85.1 kg] 85.1 kg (02/10 0500)  Hemodynamic parameters for last 24 hours: PAP: (8-40)/(-2-29) 13/10 CO:  [3.9 L/min-7.5 L/min] 4 L/min CI:  [2 L/min/m2-3.9 L/min/m2] 2.1 L/min/m2  Intake/Output from previous day: 02/09 0701 - 02/10 0700 In: 5871.2 [I.V.:3505.3; Blood:415; IV Piggyback:1950.9] Out: [id number] [Urine:3435; Blood:750; Chest Tube:501] Intake/Output this shift: Total I/O In: 50.9 [I.V.:50.9] Out: 250 [Urine:250]  General appearance: alert, cooperative, and no distress Neurologic: intact Heart: regular rate and rhythm Lungs: diminished breath sounds bibasilar Abdomen: normal findings: soft, non-tender  Lab Results: Recent Labs    01/23/23 2019 01/24/23 0355  WBC 14.2* 11.9*  HGB 10.0* 10.3*  HCT 28.8* 31.1*  PLT 145* 151   BMET:  Recent Labs    01/23/23 2019 01/24/23 0355  NA 133* 132*  K 4.2 4.6  CL 103 102  CO2 23 22  GLUCOSE 169* 177*  BUN 18 16  CREATININE 0.71 0.74  CALCIUM 7.6* 7.6*    PT/INR:  Recent Labs    01/23/23 1336  LABPROT 16.6*  INR 1.4*   ABG    Component Value Date/Time   PHART 7.379 01/23/2023 2018   HCO3 23.4 01/23/2023 2018   TCO2 25 01/23/2023 2018   ACIDBASEDEF 2.0 01/23/2023 2018    O2SAT 98 01/23/2023 2018   CBG (last 3)  Recent Labs    01/24/23 0158 01/24/23 0354 01/24/23 0728  GLUCAP 107* 173* 150*    Assessment/Plan: S/P Procedure(s) (LRB): CORONARY ARTERY BYPASS GRAFTING TIMES FOUR USING LEFT INTERNAL MAMMARY ARTERY AND RIGHT GREATER SAPHENOUS VEIN (N/A) TRANSESOPHAGEAL ECHOCARDIOGRAM (N/A) ENDOVEIN HARVEST OF RIGHT GREATER SAPHENOUS VEIN (Right) POD # 1 NEURO- no focal deficit, anxious  Was om Seroquel PTA- will restart CV- in SR, hemodynamics normal  Dc Swan, A line  ASA, statin, beta blocker RESP- IS for atelectasis  Small air leak from left pleural tube- keep on water seal RENAL- creatinine normal, lytes ok GI- advance diet as tolerated ENDO- CBG mildly elevated, SSi q4  Resume oral meds gradually Anemia secondary to ABL- mild, monitor SCD + enoxaparin Dc mediastinal tubes Cardiac rehab  LOS: 5 days    Matthew [name] 01/24/2023

## 2023-01-24 NOTE — Progress Notes (Signed)
      [address]       [place] [address]             [phone]      Sleeping currently  BP (!) 140/89   Pulse (!) 105   Temp 99.7 F (37.6 C)   Resp (!) 24   Ht '5\' 9"'$  (1.753 m)   Wt 85.1 kg   SpO2 91%   BMI 27.71 kg/m    Intake/Output Summary (Last 24 hours) at 01/24/2023 1843 Last data filed at 01/24/2023 1842 Gross per 24 hour  Intake 1522.11 ml  Output 2495 ml  Net -972.89 ml   CBG 150- 192- 224  Creatinine 0.7 Hct 32  Give levemir tonight, restart PO meds tomorrow Increase metoprolol to 25 mg BID  Remo Lipps C. Roxan Hockey, MD [facility] [phone]

## 2023-01-25 ENCOUNTER — Inpatient Hospital Stay (HOSPITAL_COMMUNITY): Payer: Medicare Other

## 2023-01-25 LAB — POCT I-STAT 7, (LYTES, BLD GAS, ICA,H+H)
Acid-base deficit: 2 mmol/L (ref 0.0–2.0)
Bicarbonate: 22.5 mmol/L (ref 20.0–28.0)
Calcium, Ion: 1.13 mmol/L — ABNORMAL LOW (ref 1.15–1.40)
HCT: 24 % — ABNORMAL LOW (ref 39.0–52.0)
Hemoglobin: 8.2 g/dL — ABNORMAL LOW (ref 13.0–17.0)
O2 Saturation: 99 %
Patient temperature: 97.7
Potassium: 4.2 mmol/L (ref 3.5–5.1)
Sodium: 132 mmol/L — ABNORMAL LOW (ref 135–145)
TCO2: 24 mmol/L (ref 22–32)
pCO2 arterial: 37.4 mmHg (ref 32–48)
pH, Arterial: 7.384 (ref 7.35–7.45)
pO2, Arterial: 119 mmHg — ABNORMAL HIGH (ref 83–108)

## 2023-01-25 LAB — BASIC METABOLIC PANEL
Anion gap: 7 (ref 5–15)
Anion gap: 7 (ref 5–15)
BUN: 12 mg/dL (ref 8–23)
BUN: 15 mg/dL (ref 8–23)
CO2: 25 mmol/L (ref 22–32)
CO2: 26 mmol/L (ref 22–32)
Calcium: 8 mg/dL — ABNORMAL LOW (ref 8.9–10.3)
Calcium: 8.8 mg/dL — ABNORMAL LOW (ref 8.9–10.3)
Chloride: 98 mmol/L (ref 98–111)
Chloride: 103 mmol/L (ref 98–111)
Creatinine, Ser: 0.72 mg/dL (ref 0.61–1.24)
Creatinine, Ser: 0.73 mg/dL (ref 0.61–1.24)
GFR, Estimated: >60 mL/min (ref >60)
GFR, Estimated: >60 mL/min (ref >60)
Glucose, Bld: 152 mg/dL — ABNORMAL HIGH (ref 70–99)
Glucose, Bld: 129 mg/dL — ABNORMAL HIGH (ref 70–99)
Potassium: 4.7 mmol/L (ref 3.5–5.1)
Potassium: 4.4 mmol/L (ref 3.5–5.1)
Sodium: 130 mmol/L — ABNORMAL LOW (ref 135–145)
Sodium: 136 mmol/L (ref 135–145)

## 2023-01-25 LAB — CBC WITH DIFFERENTIAL/PLATELET
Abs Immature Granulocytes: 0.09 10*3/uL — ABNORMAL HIGH (ref 0.00–0.07)
Basophils Absolute: 0 10*3/uL (ref 0.0–0.1)
Basophils Relative: 0 %
Eosinophils Absolute: 0 10*3/uL (ref 0.0–0.5)
Eosinophils Relative: 0 %
HCT: 31 % — ABNORMAL LOW (ref 39.0–52.0)
Hemoglobin: 10.5 g/dL — ABNORMAL LOW (ref 13.0–17.0)
Immature Granulocytes: 1 %
Lymphocytes Relative: 7 %
Lymphs Abs: 0.9 10*3/uL (ref 0.7–4.0)
MCH: 32.6 pg (ref 26.0–34.0)
MCHC: 33.9 g/dL (ref 30.0–36.0)
MCV: 96.3 fL (ref 80.0–100.0)
Monocytes Absolute: 0.9 10*3/uL (ref 0.1–1.0)
Monocytes Relative: 7 %
Neutro Abs: 10.8 10*3/uL — ABNORMAL HIGH (ref 1.7–7.7)
Neutrophils Relative %: 85 %
Platelets: 139 10*3/uL — ABNORMAL LOW (ref 150–400)
RBC: 3.22 MIL/uL — ABNORMAL LOW (ref 4.22–5.81)
RDW: 13.7 % (ref 11.5–15.5)
WBC: 12.7 10*3/uL — ABNORMAL HIGH (ref 4.0–10.5)
nRBC: 0 % (ref 0.0–0.2)

## 2023-01-25 LAB — MAGNESIUM: Magnesium: 2 mg/dL (ref 1.7–2.4)

## 2023-01-25 LAB — GLUCOSE, CAPILLARY
Glucose-Capillary: 168 mg/dL — ABNORMAL HIGH (ref 70–99)
Glucose-Capillary: 149 mg/dL — ABNORMAL HIGH (ref 70–99)
Glucose-Capillary: 125 mg/dL — ABNORMAL HIGH (ref 70–99)
Glucose-Capillary: 245 mg/dL — ABNORMAL HIGH (ref 70–99)
Glucose-Capillary: 333 mg/dL — ABNORMAL HIGH (ref 70–99)
Glucose-Capillary: 213 mg/dL — ABNORMAL HIGH (ref 70–99)

## 2023-01-25 LAB — HEMOGLOBIN AND HEMATOCRIT, BLOOD
HCT: 25.8 % — ABNORMAL LOW (ref 39.0–52.0)
Hemoglobin: 8.8 g/dL — ABNORMAL LOW (ref 13.0–17.0)

## 2023-01-25 MED ORDER — EMPAGLIFLOZIN 25 MG PO TABS
25.0000 mg | ORAL_TABLET | Freq: Every day | ORAL | Status: DC
  Administered 2023-01-25 – 2023-01-28 (×4): 25 mg via ORAL
  Filled 2023-01-25 (×4): qty 1

## 2023-01-25 MED ORDER — CALCIUM CHLORIDE 10 % IV SOLN
INTRAVENOUS | Status: AC
  Administered 2023-01-25: 1 g via INTRAVENOUS
  Filled 2023-01-25: qty 10

## 2023-01-25 MED ORDER — FUROSEMIDE 40 MG PO TABS
40.0000 mg | ORAL_TABLET | Freq: Every day | ORAL | Status: DC
  Administered 2023-01-25 – 2023-01-26 (×2): 40 mg via ORAL
  Filled 2023-01-25 (×2): qty 1

## 2023-01-25 MED ORDER — ALBUMIN HUMAN 5 % IV SOLN: 25.0000 g | Freq: Once | INTRAVENOUS | Status: AC

## 2023-01-25 MED ORDER — ALBUMIN HUMAN 5 % IV SOLN
INTRAVENOUS | Status: AC
  Filled 2023-01-25: qty 250

## 2023-01-25 MED ORDER — QUETIAPINE FUMARATE 100 MG PO TABS
200.0000 mg | ORAL_TABLET | Freq: Every day | ORAL | Status: DC
  Administered 2023-01-25 – 2023-01-27 (×3): 200 mg via ORAL
  Filled 2023-01-25 (×3): qty 2

## 2023-01-25 MED ORDER — ALBUMIN HUMAN 5 % IV SOLN
INTRAVENOUS | Status: AC
  Administered 2023-01-25: 25 g via INTRAVENOUS
  Filled 2023-01-25: qty 250

## 2023-01-25 MED ORDER — INSULIN ASPART 100 UNIT/ML IJ SOLN
0.0000 [IU] | Freq: Three times a day (TID) | INTRAMUSCULAR | Status: DC
  Administered 2023-01-25: 11 [IU] via SUBCUTANEOUS
  Administered 2023-01-25 – 2023-01-26 (×2): 5 [IU] via SUBCUTANEOUS
  Administered 2023-01-26: 3 [IU] via SUBCUTANEOUS
  Administered 2023-01-26 – 2023-01-27 (×2): 5 [IU] via SUBCUTANEOUS
  Administered 2023-01-27: 2 [IU] via SUBCUTANEOUS
  Administered 2023-01-27: 3 [IU] via SUBCUTANEOUS
  Administered 2023-01-28: 2 [IU] via SUBCUTANEOUS

## 2023-01-25 MED ORDER — CALCIUM CHLORIDE 10 % IV SOLN: 1.0000 g | Freq: Once | INTRAVENOUS | Status: AC

## 2023-01-25 MED ORDER — INSULIN ASPART 100 UNIT/ML IJ SOLN
0.0000 [IU] | Freq: Every day | INTRAMUSCULAR | Status: DC
  Administered 2023-01-25: 2 [IU] via SUBCUTANEOUS
  Administered 2023-01-26: 3 [IU] via SUBCUTANEOUS

## 2023-01-25 MED ORDER — PREDNISONE 20 MG PO TABS
10.0000 mg | ORAL_TABLET | Freq: Two times a day (BID) | ORAL | Status: DC
  Administered 2023-01-25: 10 mg via ORAL
  Filled 2023-01-25: qty 1

## 2023-01-25 MED ORDER — METOPROLOL TARTRATE 12.5 MG HALF TABLET
12.5000 mg | ORAL_TABLET | Freq: Two times a day (BID) | ORAL | Status: DC
  Administered 2023-01-25 – 2023-01-26 (×4): 12.5 mg via ORAL
  Filled 2023-01-25 (×4): qty 1

## 2023-01-25 MED ORDER — NOREPINEPHRINE 4 MG/250ML-% IV SOLN
INTRAVENOUS | Status: AC
  Filled 2023-01-25: qty 250

## 2023-01-25 MED ORDER — ALBUMIN HUMAN 25 % IV SOLN
INTRAVENOUS | Status: AC
  Filled 2023-01-25: qty 50

## 2023-01-25 MED ORDER — POTASSIUM CHLORIDE CRYS ER 20 MEQ PO TBCR
20.0000 meq | EXTENDED_RELEASE_TABLET | Freq: Every day | ORAL | Status: DC
  Administered 2023-01-25 – 2023-01-26 (×2): 20 meq via ORAL
  Filled 2023-01-25 (×2): qty 2
  Filled 2023-01-25: qty 1
  Filled 2023-01-25: qty 2

## 2023-01-25 MED ORDER — METHYLPREDNISOLONE SODIUM SUCC 125 MG IJ SOLR
60.0000 mg | Freq: Once | INTRAMUSCULAR | Status: AC
  Administered 2023-01-25: 60 mg via INTRAVENOUS
  Filled 2023-01-25: qty 2

## 2023-01-25 MED ORDER — GLIPIZIDE 10 MG PO TABS
10.0000 mg | ORAL_TABLET | Freq: Two times a day (BID) | ORAL | Status: DC
  Administered 2023-01-25 – 2023-01-28 (×7): 10 mg via ORAL
  Filled 2023-01-25 (×9): qty 1

## 2023-01-25 NOTE — Progress Notes (Signed)
      [address]       [place] [address]             [phone]      CTSP for hypotension refractory to neo   BP relatively low in 90s from midnight.  ~ 3 AM BP dropped into 60s and neo drip started.  No change in HR. RN reported difficult to hear heart sounds.  On arrival HR 85 SR, BP by cuff 85/60 on 200 mcg/min neo  Had received 1 bottle of albumin.  Additional albumin given, CVP initially 3-4, after volume now up to 8.  A line placed  ABG on 100% NRB 7.38/40/112/-2  Lethargic but responsive and follows commands  Dr. Lajoyce Lauber did bedside TTE which showed good LV systolic function, RV more difficult to visualize but also appeared reasonable. No pericardial effusion  CXR - LLL atelectasis  Currently down to 100 of neo with 112/80  Matthew Lipps C. Roxan Hockey, MD [facility] [phone]

## 2023-01-25 NOTE — Procedures (Signed)
Arterial Catheter Insertion Procedure Note  BENTO GANTERT  SA:[id number]  17-Dec-1945  Date:01/25/23  Time:4:01 AM    Provider Performing: Lacretia Nicks    Procedure: Insertion of Arterial Line [phone]) without US guidance  Indication(s) Blood pressure monitoring and/or need for frequent ABGs  Consent Unable to obtain consent due to emergent nature of procedure.  Anesthesia None   Time Out Verified patient identification, verified procedure, site/side was marked, verified correct patient position, special equipment/implants available, medications/allergies/relevant history reviewed, required imaging and test results available.   Sterile Technique Maximal sterile technique including full sterile barrier drape, hand hygiene, sterile gown, sterile gloves, mask, hair covering, sterile ultrasound probe cover (if used).   Procedure Description Area of catheter insertion was cleaned with chlorhexidine and draped in sterile fashion. Without real-time ultrasound guidance an arterial catheter was placed into the left radial artery.  Appropriate arterial tracings confirmed on monitor.     Complications/Tolerance None; patient tolerated the procedure well.   EBL Minimal  Normal saline flush used. Site secured per RT protocol. Leveled and zeroed. Good waveform and blood return.   Specimen(s) None

## 2023-01-25 NOTE — Progress Notes (Signed)
2 Days Post-Op Procedure(s) (LRB): CORONARY ARTERY BYPASS GRAFTING TIMES FOUR USING LEFT INTERNAL MAMMARY ARTERY AND RIGHT GREATER SAPHENOUS VEIN (N/A) TRANSESOPHAGEAL ECHOCARDIOGRAM (N/A) ENDOVEIN HARVEST OF RIGHT GREATER SAPHENOUS VEIN (Right) Subjective: No complaints this AM Awake and alert  Objective: Vital signs in last 24 hours: Temp:  [97.8 F (36.6 C)-99.7 F (37.6 C)] 97.8 F (36.6 C) (02/11 0800) Pulse Rate:  [81-114] 98 (02/11 0900) Cardiac Rhythm: Normal sinus rhythm (02/11 0800) Resp:  [9-28] 24 (02/11 0900) BP: (59-172)/(47-106) 107/70 (02/11 0900) SpO2:  [87 %-100 %] 92 % (02/11 0900) Arterial Line BP: (89-171)/(56-90) 133/69 (02/11 0900)  Hemodynamic parameters for last 24 hours: PAP: (15-25)/(6-11) 18/6 CVP:  [4 mmHg-12 mmHg] 5 mmHg  Intake/Output from previous day: 02/10 0701 - 02/11 0700 In: 2141.6 [I.V.:841.7; IV Piggyback:1299.9] Out: 2385 [Urine:2295; Chest Tube:90] Intake/Output this shift: Total I/O In: 38.9 [I.V.:38.9] Out: 165 [Urine:125; Chest Tube:40]  General appearance: alert, cooperative, and no distress Neurologic: intact Heart: regular rate and rhythm Lungs: diminished breath sounds bibasilar Abdomen: normal findings: soft, non-tender  Lab Results: Recent Labs    01/24/23 1621 01/25/23 0133 01/25/23 0345 01/25/23 0850  WBC 13.5* 12.7*  --   --   HGB 10.8* 10.5* 8.2* 8.8*  HCT 32.2* 31.0* 24.0* 25.8*  PLT 145* 139*  --   --    BMET:  Recent Labs    01/24/23 1621 01/25/23 0133 01/25/23 0345  NA 131* 130* 132*  K 4.4 4.7 4.2  CL 98 98  --   CO2 25 25  --   GLUCOSE 203* 152*  --   BUN 12 12  --   CREATININE 0.69 0.72  --   CALCIUM 8.0* 8.0*  --     PT/INR:  Recent Labs    01/23/23 1336  LABPROT 16.6*  INR 1.4*   ABG    Component Value Date/Time   PHART 7.384 01/25/2023 0345   HCO3 22.5 01/25/2023 0345   TCO2 24 01/25/2023 0345   ACIDBASEDEF 2.0 01/25/2023 0345   O2SAT 99 01/25/2023 0345   CBG (last 3)   Recent Labs    01/25/23 0307 01/25/23 0347 01/25/23 0840  GLUCAP 168* 149* 125*    Assessment/Plan: S/P Procedure(s) (LRB): CORONARY ARTERY BYPASS GRAFTING TIMES FOUR USING LEFT INTERNAL MAMMARY ARTERY AND RIGHT GREATER SAPHENOUS VEIN (N/A) TRANSESOPHAGEAL ECHOCARDIOGRAM (N/A) ENDOVEIN HARVEST OF RIGHT GREATER SAPHENOUS VEIN (Right) Looks great this AM after events of last night NEURO- intact CV- in Sr @ 100,  128/54 by A line, 107/70 by cuff  Decrease Lopressor to 12.5 mg BID RESP- on 6L [place], CXR with left lower lobe atelectasis  Continue IS, add flutter valve  Air leak resolved- dc chest tube RENAL- creatinine normal, lytes OK  Diurese as BP allows ENDO- CBG better after levemir started  Will resume PO meds gradually GI- diet as tolerated SCD + enoxaparin Anemia secondary to ABL- Hgb 25 after volume- monitor Mobilize as tolerated    LOS: 6 days    Matthew [name] 01/25/2023

## 2023-01-25 NOTE — Progress Notes (Signed)
      [address]       [place] [address]             [phone]      Up in chair eating supper  No complaints, feels well  BP 102/67   Pulse (!) 102   Temp 97.9 F (36.6 C) (Oral)   Resp (!) 25   Ht '5\' 9"'$  (1.753 m)   Wt 85.1 kg   SpO2 90%   BMI 27.71 kg/m  In sinus tachy   Intake/Output Summary (Last 24 hours) at 01/25/2023 1707 Last data filed at 01/25/2023 1500 Gross per 24 hour  Intake 2653.45 ml  Output 2900 ml  Net -246.55 ml   Looks great  Quanah C. Roxan Hockey, MD [facility] [phone]

## 2023-01-26 ENCOUNTER — Encounter (HOSPITAL_COMMUNITY): Payer: Self-pay | Admitting: Surgery

## 2023-01-26 ENCOUNTER — Inpatient Hospital Stay (HOSPITAL_COMMUNITY): Payer: Medicare Other

## 2023-01-26 DIAGNOSIS — I200 Unstable angina: Secondary | ICD-10-CM | POA: Diagnosis not present

## 2023-01-26 LAB — ECHO INTRAOPERATIVE TEE
AV Mean grad: 2 mmHg
AV Peak grad: 3.9 mmHg
Ao pk vel: 0.99 m/s
Height: 69 in
S' Lateral: 2.4 cm
Single Plane A2C EF: 54.1 %
Weight: 2723.12 oz

## 2023-01-26 LAB — BASIC METABOLIC PANEL
Anion gap: 9 (ref 5–15)
BUN: 26 mg/dL — ABNORMAL HIGH (ref 8–23)
CO2: 24 mmol/L (ref 22–32)
Calcium: 8.5 mg/dL — ABNORMAL LOW (ref 8.9–10.3)
Chloride: 102 mmol/L (ref 98–111)
Creatinine, Ser: 0.9 mg/dL (ref 0.61–1.24)
GFR, Estimated: >60 mL/min (ref >60)
Glucose, Bld: 194 mg/dL — ABNORMAL HIGH (ref 70–99)
Potassium: 4.4 mmol/L (ref 3.5–5.1)
Sodium: 135 mmol/L (ref 135–145)

## 2023-01-26 LAB — CBC
HCT: 27.3 % — ABNORMAL LOW (ref 39.0–52.0)
Hemoglobin: 9.2 g/dL — ABNORMAL LOW (ref 13.0–17.0)
MCH: 33 pg (ref 26.0–34.0)
MCHC: 33.7 g/dL (ref 30.0–36.0)
MCV: 97.8 fL (ref 80.0–100.0)
Platelets: 127 10*3/uL — ABNORMAL LOW (ref 150–400)
RBC: 2.79 MIL/uL — ABNORMAL LOW (ref 4.22–5.81)
RDW: 13.8 % (ref 11.5–15.5)
WBC: 9.5 10*3/uL (ref 4.0–10.5)
nRBC: 0 % (ref 0.0–0.2)

## 2023-01-26 LAB — GLUCOSE, CAPILLARY
Glucose-Capillary: 224 mg/dL — ABNORMAL HIGH (ref 70–99)
Glucose-Capillary: 247 mg/dL — ABNORMAL HIGH (ref 70–99)
Glucose-Capillary: 162 mg/dL — ABNORMAL HIGH (ref 70–99)
Glucose-Capillary: 276 mg/dL — ABNORMAL HIGH (ref 70–99)

## 2023-01-26 MED ORDER — SODIUM CHLORIDE 0.9 % IV SOLN: 250.0000 mL | INTRAVENOUS | Status: DC | PRN

## 2023-01-26 MED ORDER — CYCLOBENZAPRINE HCL 10 MG PO TABS
5.0000 mg | ORAL_TABLET | Freq: Three times a day (TID) | ORAL | Status: DC
  Administered 2023-01-26 – 2023-01-28 (×6): 5 mg via ORAL
  Filled 2023-01-26 (×6): qty 1

## 2023-01-26 MED ORDER — SODIUM CHLORIDE 0.9% FLUSH: 3.0000 mL | INTRAVENOUS | Status: DC | PRN

## 2023-01-26 MED ORDER — METFORMIN HCL 500 MG PO TABS
1000.0000 mg | ORAL_TABLET | Freq: Two times a day (BID) | ORAL | Status: DC
  Administered 2023-01-26 – 2023-01-28 (×5): 1000 mg via ORAL
  Filled 2023-01-26 (×5): qty 2

## 2023-01-26 MED ORDER — TAMSULOSIN HCL 0.4 MG PO CAPS
0.4000 mg | ORAL_CAPSULE | Freq: Every day | ORAL | Status: DC
  Administered 2023-01-27 – 2023-01-28 (×2): 0.4 mg via ORAL
  Filled 2023-01-26 (×2): qty 1

## 2023-01-26 MED ORDER — SODIUM CHLORIDE 0.9% FLUSH
3.0000 mL | Freq: Two times a day (BID) | INTRAVENOUS | Status: DC
  Administered 2023-01-26 – 2023-01-27 (×4): 3 mL via INTRAVENOUS

## 2023-01-26 MED ORDER — ASPIRIN 325 MG PO TBEC
325.0000 mg | DELAYED_RELEASE_TABLET | Freq: Every day | ORAL | Status: DC
  Administered 2023-01-27 – 2023-01-28 (×2): 325 mg via ORAL
  Filled 2023-01-26 (×2): qty 1

## 2023-01-26 MED ORDER — OXYCODONE HCL 5 MG PO TABS
5.0000 mg | ORAL_TABLET | ORAL | Status: DC | PRN
  Administered 2023-01-26 – 2023-01-27 (×3): 10 mg via ORAL
  Filled 2023-01-26 (×3): qty 2

## 2023-01-26 MED ORDER — ~~LOC~~ CARDIAC SURGERY, PATIENT & FAMILY EDUCATION: Freq: Once | Status: AC

## 2023-01-26 MED ORDER — TRAMADOL HCL 50 MG PO TABS: 50.0000 mg | ORAL_TABLET | ORAL | Status: DC | PRN

## 2023-01-26 NOTE — Discharge Summary (Signed)
[address]       [place] [address]             [phone]    Physician Discharge Summary  Patient ID: Matthew [name] MRN: [id number] DOB/AGE: 07/17/1946 77 y.o.  Admit date: 01/19/2023 Discharge date: 01/28/2023  Admission Diagnoses:  Patient Active Problem List   Diagnosis Date Noted   S/P CABG x 4 01/23/2023   Unstable angina (HCC) 01/20/2023   Acute respiratory failure with hypoxia ([place]) 01/19/2023   HTN (hypertension) 01/19/2023   Chest pain 01/19/2023   Drug-induced polyneuropathy ([place]) 11/08/2019   GAD (generalized anxiety disorder) 01/20/2019   Bipolar 1 disorder ([place]) 01/20/2019   PTSD (post-traumatic stress disorder) 01/20/2019   OA (osteoarthritis) 01/20/2019   COPD (chronic obstructive pulmonary disease) ([place]) 01/20/2019   GERD (gastroesophageal reflux disease) 01/20/2019   PUD (peptic ulcer disease) 01/20/2019   DM (diabetes mellitus), type 2 ([place]) 01/20/2019   BPH (benign prostatic hyperplasia) 01/20/2019   History of cold sores 01/20/2019   HLD (hyperlipidemia) 01/20/2019   Colon cancer ([place]) 06/06/2014     Discharge Diagnoses:  Patient Active Problem List   Diagnosis Date Noted   S/P CABG x 4 01/23/2023   Unstable angina ([place]) 01/20/2023   Acute respiratory failure with hypoxia ([place]) 01/19/2023   HTN (hypertension) 01/19/2023   Chest pain 01/19/2023   Drug-induced polyneuropathy ([place]) 11/08/2019   GAD (generalized anxiety disorder) 01/20/2019   Bipolar 1 disorder ([place]) 01/20/2019   PTSD (post-traumatic stress disorder) 01/20/2019   OA (osteoarthritis) 01/20/2019   COPD (chronic obstructive pulmonary disease) ([place]) 01/20/2019   GERD (gastroesophageal reflux disease) 01/20/2019   PUD (peptic ulcer disease) 01/20/2019   DM (diabetes mellitus), type 2 ([place]) 01/20/2019   BPH (benign prostatic hyperplasia) 01/20/2019   History of cold sores 01/20/2019   HLD (hyperlipidemia) 01/20/2019   Colon cancer ([place]) 06/06/2014      Discharged Condition: good   PCP is [facility] Referring Provider is Dr. Martin[place], MD    History of Presenting Illness: At time of cardiothoracic surgical consultation This is a 77 year old male with a past medical history of COPD, hypertension, hyperlipidemia, hypothyroidism, DM (type II), PTSD, GERD, peptic ulcer, diverticulitis, Bipolar 1 disorder, anxiety, arthritis, remote tobacco abuse, chronic back pain, and colon cancer who presented on 01/19/2023 with complaints of chest pressure and shortness of breath. EKG showed inferior Q waves but no acute ischemic changes. Initial Troponin I (high sensitivity) was 5 Echo showed LVEF 65-70% and no significant valvular disease. Cardiac catheterization  showed Ramus Intermediate with a 75 stenosis, OM1 with a 90% stenosis, OM2 with a 99% stenosis. Cardiothoracic consultation was obtained for consideration of coronary artery bypass grafting surgery.   The patient and all relevant studies were reviewed by Dr. Cyndia Bent who recommended proceeding with CABG as best long-term option due to the severity of disease and anatomical findings.  Hospital course:  The patient was medically stabilized and full diagnostic evaluation was done.  He was felt to be stable to proceed with surgery and on 01/23/2023 he was taken the operating room at which time he underwent coronary artery bypass grafting x 4.  He tolerated the procedure well was taken the surgical intensive care unit in stable condition.  Postoperative hospital course:  The patient was extubated using standard post cardiac surgical protocols without difficulty.  He has remained neurologically intact.  He initially maintained very stable hemodynamics but did have an episode of hypotension  which initially did not respond to Neo-Synephrine and fluids.  It was felt that it could have been related to the vasodilatory effects of multiple of his medications.  He was able to be resuscitated without  significant sequela and has continued to remain hemodynamically stable in sinus rhythm.  These medications have been titrated lower.  All routine lines, monitors and drainage devices have been discontinued in the standard fashion.  He has maintained stable renal function.  He does have an expected postoperative volume overload but is responding well to diuretics.  He is now below his preoperative weight and diuretics have been discontinued.  He does have a expected mild acute blood loss anemia which is being monitored clinically and has not required transfusion.  He has been started on aspirin, high-dose atorvastatin, beta-blocker as well as Jardiance.  He has had some elevated blood sugars but this is felt to be possibly related to prednisone which has subsequently been discontinued.  He is to be discharged on his home diabetic regimen with primary care follow-up.  His most recent hemoglobin A1c is 6.7.  His incisions are noted to be healing well without evidence of infection.  Oxygen has been weaned and he maintains good saturations on room air.  He is tolerating gradually increasing activity using cardiac rehab modalities.  Overall, at the time of discharge the patient is felt to be quite stable.      Consults: cardiology and hospitalist on  admission   Significant Diagnostic Studies:  DG Chest Port 1 View  Result Date: 01/26/2023 CLINICAL DATA:  Status post CABG. EXAM: PORTABLE CHEST 1 VIEW COMPARISON:  01/25/2023 FINDINGS: RIGHT IJ sheath remains in place. Status post median sternotomy. Heart is enlarged, stable in configuration. LEFT-sided chest tube has been removed. There is no pneumothorax. RIGHT lung is clear. There is minimal subsegmental atelectasis or consolidation with small pleural effusion at the LEFT lung base. IMPRESSION: Interval removal of LEFT-sided chest tube. No pneumothorax. Electronically Signed   By: Nolon Nations M.D.   On: 01/26/2023 08:12   ECHO INTRAOPERATIVE  TEE  Result Date: 01/26/2023  *INTRAOPERATIVE TRANSESOPHAGEAL REPORT *  Patient Name:   Matthew [name] Date of Exam: 01/23/2023 Medical Rec #:  [id number]       Height:       69.0 in Accession #:    [id number]      Weight:       170.2 lb Date of Birth:  1946-05-04        BSA:          1.93 m Patient Age:    29 years        BP:           178/112 mmHg Patient Gender: M               HR:           94 bpm. Exam Location:  Anesthesiology Transesophogeal exam was perform intraoperatively during surgical procedure. Patient was closely monitored under general anesthesia during the entirety of examination. Indications:     Coronary artery disease Sonographer:     Darlina Sicilian RDCS Performing Phys: [id number] Diagnosing Phys: Myrtie Soman MD Complications: No known complications during this procedure. POST-OP IMPRESSIONS _ Left Ventricle: The left ventricle is unchanged from pre-bypass. _ Right Ventricle: The right ventricle appears unchanged from pre-bypass. _ Aorta: The aorta appears unchanged from pre-bypass. _ Left Atrium: The left atrium appears unchanged from pre-bypass. _ Left Atrial Appendage:  The left atrial appendage appears unchanged from pre-bypass. _ Aortic Valve: The aortic valve appears unchanged from pre-bypass. _ Mitral Valve: The mitral valve appears unchanged from pre-bypass. _ Tricuspid Valve: The tricuspid valve appears unchanged from pre-bypass. _ Pulmonic Valve: The pulmonic valve appears unchanged from pre-bypass. _ Interatrial Septum: The interatrial septum appears unchanged from pre-bypass. _ Interventricular Septum: The interventricular septum appears unchanged from pre-bypass. _ Pericardium: The pericardium appears unchanged from pre-bypass. _ Comments: Good LV function post bypass EF>60%. PRE-OP FINDINGS  Left Ventricle: The left ventricle has normal systolic function, with an ejection fraction of 60-65%. The cavity size was normal. There is no increase in left ventricular wall thickness.  There is no left ventricular hypertrophy. Right Ventricle: The right ventricle has normal systolic function. The cavity was normal. There is no increase in right ventricular wall thickness. Left Atrium: Left atrial size was normal in size. No left atrial/left atrial appendage thrombus was detected. The left atrial appendage is well visualized and there is no evidence of thrombus present. Right Atrium: Right atrial size was normal in size. Interatrial Septum: No atrial level shunt detected by color flow Doppler. Agitated saline contrast was given intravenously to evaluate for intracardiac shunting. There is no evidence of a patent foramen ovale. Pericardium: Trivial pericardial effusion is present. Mitral Valve: The mitral valve is normal in structure. Mitral valve regurgitation is trivial by color flow Doppler. There is No evidence of mitral stenosis. Tricuspid Valve: The tricuspid valve was normal in structure. Tricuspid valve regurgitation is mild by color flow Doppler. No evidence of tricuspid stenosis is present. Aortic Valve: The aortic valve is normal in structure. Aortic valve regurgitation is trivial by color flow Doppler. There is no stenosis of the aortic valve. Pulmonic Valve: The pulmonic valve was normal in structure, with normal. Pulmonic valve regurgitation is not visualized by color flow Doppler. Aorta: The aortic root and ascending aorta are normal in size and structure. Shunts: There is no evidence of an atrial septal defect. +--------------+--------++ LEFT VENTRICLE         +--------------+--------++ PLAX 2D                +--------------+--------++ LVIDd:        3.90 cm  +--------------+--------++ LVIDs:        2.40 cm  +--------------+--------++ LV PW:        0.50 cm  +--------------+--------++ LVOT diam:    2.40 cm  +--------------+--------++ LV SV:        46 ml    +--------------+--------++ LV SV Index:  23.49    +--------------+--------++ LVOT Area:    4.52  cm +--------------+--------++                        +--------------+--------++  +------------------+---------++ LV Volumes (MOD)            +------------------+---------++ LV area d, A2C:   25.90 cm +------------------+---------++ LV area s, A2C:   16.40 cm +------------------+---------++ LV major d, A2C:  7.62 cm   +------------------+---------++ LV major s, A2C:  7.20 cm   +------------------+---------++ LV vol d, MOD A2C:71.9 ml   +------------------+---------++ LV vol s, MOD A2C:33.0 ml   +------------------+---------++ LV SV MOD A2C:    38.9 ml   +------------------+---------++ +-------------+-----------++ AORTIC VALVE             +-------------+-----------++ AV Vmax:     99.10 cm/s  +-------------+-----------++ AV Vmean:    68.700 cm/s +-------------+-----------++ AV VTI:  0.164 m     +-------------+-----------++ AV Peak Grad:3.9 mmHg    +-------------+-----------++ AV Mean Grad:2.0 mmHg    +-------------+-----------++  +-------------+-------++ AORTA                +-------------+-------++ Ao Root diam:3.70 cm +-------------+-------++  +--------------+-------+ SHUNTS                +--------------+-------+ Systemic Diam:2.40 cm +--------------+-------+  Myrtie Soman MD Electronically signed by Myrtie Soman MD Signature Date/Time: 01/26/2023/7:29:08 AM    Final    DG CHEST PORT 1 VIEW  Result Date: 01/25/2023 CLINICAL DATA:  Hypotension EXAM: PORTABLE CHEST 1 VIEW COMPARISON:  01/24/2023 FINDINGS: Swan-Ganz catheter and mediastinal drain have been removed. Right internal jugular Cordis introducer is unchanged. Left chest tube in place. No pneumothorax or pleural effusion. Pulmonary insufflation is stable. Retrocardiac opacity has progressed in keeping with atelectasis or infiltrate within this region. Coronary artery bypass grafting has been performed. Cardiac size is mildly enlarged, unchanged. Pulmonary vascularity is normal.  IMPRESSION: 1. Left chest tube in place. No pneumothorax. 2. Progressive retrocardiac opacity in keeping with atelectasis or infiltrate. Electronically Signed   By: Fidela Salisbury M.D.   On: 01/25/2023 03:42   DG Chest Port 1 View  Result Date: 01/24/2023 CLINICAL DATA:  Postop from CABG. EXAM: PORTABLE CHEST 1 VIEW COMPARISON:  01/23/2023 FINDINGS: Support lines and tubes are unchanged in position. No pneumothorax visualized. Stable cardiomegaly. Mild left basilar atelectasis is also stable. No new or worsening areas of pulmonary opacity are seen. IMPRESSION: Stable mild left basilar atelectasis. No pneumothorax visualized. Electronically Signed   By: Marlaine Hind M.D.   On: 01/24/2023 09:04   DG Chest Port 1 View  Result Date: 01/23/2023 CLINICAL DATA:  History of ETT, confirm Swan catheter EXAM: PORTABLE CHEST 1 VIEW COMPARISON:  01/23/2023, 01/19/2023 chest x-ray and CT FINDINGS: Hardware in the cervical spine. Post sternotomy changes. Mediastinal and chest drainage catheters as before. Endotracheal tube has been removed. Enteric tube has been removed. Right IJ Swan-Ganz catheter with coiling at the right atrium, tip positioned over pulmonary outflow tract. Cardiomegaly and aortic atherosclerosis. Streaky basilar atelectasis. No pleural effusion or significant pneumothorax. Aortic atherosclerosis. IMPRESSION: 1. Right IJ Swan-Ganz catheter with coiled/looped appearance at the right atrium, tip positioned over the pulmonary outflow tract. 2. Cardiomegaly with streaky basilar atelectasis. 3. Removal of endotracheal and esophageal tubes Electronically Signed   By: Donavan Foil M.D.   On: 01/23/2023 20:04   DG Chest Port 1 View  Result Date: 01/23/2023 CLINICAL DATA:  Post CABG EXAM: PORTABLE CHEST 1 VIEW COMPARISON:  01/19/2023 FINDINGS: Interval postsurgical changes to the chest following median sternotomy and CABG. Endotracheal tube terminates within the midthoracic trachea. Enteric tube extends into  the stomach. Right IJ approach pulmonary arterial catheter is coiled within the expected location of the right atrium but distal tip extending to the central pulmonary outflow. Mediastinal drain and left chest tube are in place. Mild cardiomegaly. Aortic atherosclerosis. Mild bibasilar atelectasis. No pleural effusion or pneumothorax. IMPRESSION: 1. Interval postsurgical changes to the chest following median sternotomy and CABG. No acute findings. 2. Right IJ approach pulmonary arterial catheter is coiled within the right atrium but distal tip is positioned within the central pulmonary outflow. 3. Remaining support apparatus in satisfactory positioning, as above. Electronically Signed   By: Davina Poke D.O.   On: 01/23/2023 14:22   EP STUDY  Result Date: 01/23/2023 See surgical note for result.  CT ANGIO HEAD NECK W WO  CM  Result Date: 01/22/2023 CLINICAL DATA:  Neuro deficit, acute, stroke suspected. EXAM: CT ANGIOGRAPHY HEAD AND NECK TECHNIQUE: Multidetector CT imaging of the head and neck was performed using the standard protocol during bolus administration of intravenous contrast. Multiplanar CT image reconstructions and MIPs were obtained to evaluate the vascular anatomy. Carotid stenosis measurements (when applicable) are obtained utilizing NASCET criteria, using the distal internal carotid diameter as the denominator. RADIATION DOSE REDUCTION: This exam was performed according to the departmental dose-optimization program which includes automated exposure control, adjustment of the mA and/or kV according to patient size and/or use of iterative reconstruction technique. CONTRAST:  66m OMNIPAQUE IOHEXOL 350 MG/ML SOLN COMPARISON:  Head CT January 21, 2023. MRI of the brain January 21, 2019. FINDINGS: CTA NECK FINDINGS Aortic arch: Standard branching. Imaged portion shows no evidence of aneurysm or dissection. Soft and calcified plaques are seen along the aortic arch. No significant stenosis of the  major arch vessel origins. Right carotid system: Calcified atherosclerotic plaques are seen in the right carotid bifurcation without hemodynamically significant stenosis. In the superior aspect of the right carotid bulb there is a small dissection flap (series 7, image 198) with associated shallow pseudoaneurysm measuring 3 x 1 mm (series 7, image 201). There is no flow limitation or intraluminal thrombus. Left carotid system: Calcified plaques are seen in the left carotid bifurcation without hemodynamically significant stenosis. Vertebral arteries: Codominant. No evidence of dissection, stenosis (50% or greater), or occlusion. Skeleton: Degenerative changes of the cervical spine and temporomandibular joints. Other neck: Negative. Upper chest: Prominent centrilobular emphysema. Review of the MIP images confirms the above findings CTA HEAD FINDINGS Anterior circulation: Calcified plaques are seen along the intracranial segments of the bilateral internal carotid arteries without hemodynamically significant stenosis. Normal caliber and enhancement of the bilateral MCA vascular trees. Short segment of mild stenosis in the A3-A4 segment of the left ACA suggesting mild atherosclerosis. Otherwise, normal caliber of the bilateral ACA vascular trees. No evidence of significant stenosis, occlusion, aneurysm or vascular malformation. Posterior circulation: No significant stenosis, proximal occlusion, aneurysm, or vascular malformation. Venous sinuses: Patent. Anatomic variants: None significant. Review of the MIP images confirms the above findings IMPRESSION: 1. Small dissection flap in the superior aspect of the right carotid bulb with associated shallow pseudoaneurysm measuring 3 x 1 mm. No flow limitation or intraluminal thrombus. 2. Calcified plaques are seen along the intracranial segments of the bilateral internal carotid arteries without hemodynamically significant stenosis. 3. Short segment of mild stenosis in the  A3-A4 segment of the left ACA suggesting mild atherosclerosis. 4. Emphysema. Aortic Atherosclerosis (ICD10-I70.0) and Emphysema (ICD10-J43.9). Electronically Signed   By: KPedro EarlsM.D.   On: 01/22/2023 10:08   MR BRAIN WO CONTRAST  Result Date: 01/22/2023 CLINICAL DATA:  Initial evaluation for headache, neuro deficit, double vision. EXAM: MRI HEAD WITHOUT CONTRAST TECHNIQUE: Multiplanar, multiecho pulse sequences of the brain and surrounding structures were obtained without intravenous contrast. COMPARISON:  Prior CT from earlier the same day. FINDINGS: Brain: Cerebral volume within normal limits. Patchy T2/FLAIR hyperintensity involving the periventricular and deep white matter both cerebral hemispheres, most characteristic of chronic microvascular ischemic disease, mild to moderate in nature. Superimposed subcentimeter remote white matter lacunar infarct noted at the anterior right frontal lobe (series 11, image 18). No evidence for acute or subacute ischemia. Gray-white matter differentiation maintained. No areas of chronic cortical infarction. No acute or chronic intracranial blood products. No mass lesion, midline shift or mass effect. No hydrocephalus or extra-axial  fluid collection. Pituitary gland and suprasellar region within normal limits. Vascular: Major intracranial vascular flow voids are maintained. Skull and upper cervical spine: Cranial junctional limits. Bone marrow signal intensity normal. Small osteoma noted at the left frontal scalp. Scalp soft tissues otherwise unremarkable. Sinuses/Orbits: Globes and orbital soft tissues within normal limits. Paranasal sinuses are largely clear. No significant mastoid effusion. Other: None. IMPRESSION: 1. No acute intracranial abnormality. 2. Mild-to-moderate chronic microvascular ischemic disease. Electronically Signed   By: Jeannine Boga M.D.   On: 01/22/2023 02:12   VAS US DOPPLER PRE CABG  Result Date:  01/21/2023 PREOPERATIVE VASCULAR EVALUATION Patient Name:  Matthew [name]  Date of Exam:   01/21/2023 Medical Rec #: [id number]        Accession #:    [id number] Date of Birth: January 02, 1946         Patient Gender: M Patient Age:   43 years Exam Location:  [facility] Procedure:      VAS US DOPPLER PRE CABG Referring Phys: Gilford Raid --------------------------------------------------------------------------------  Indications:      Pre-CABG. Risk Factors:     Hypertension, hyperlipidemia, Diabetes. Other Factors:    History of colon cancer. Comparison Study: No prior studies. Performing Technologist: Darlin Coco RDMS RVT  Examination Guidelines: A complete evaluation includes B-mode imaging, spectral Doppler, color Doppler, and power Doppler as needed of all accessible portions of each vessel. Bilateral testing is considered an integral part of a complete examination. Limited examinations for reoccurring indications may be performed as noted.  Right Carotid Findings: +----------+--------+--------+--------+------------+--------+           PSV cm/sEDV cm/sStenosisDescribe    Comments +----------+--------+--------+--------+------------+--------+ CCA Prox  73      16                                   +----------+--------+--------+--------+------------+--------+ CCA Distal80      17                                   +----------+--------+--------+--------+------------+--------+ ICA Prox  94      28                                   +----------+--------+--------+--------+------------+--------+ ICA Mid   59      18      1-39%   heterogenous         +----------+--------+--------+--------+------------+--------+ ICA Distal49      14                                   +----------+--------+--------+--------+------------+--------+ ECA       105     12                                   +----------+--------+--------+--------+------------+--------+  +----------+--------+-------+----------------+------------+           PSV cm/sEDV cmsDescribe        Arm Pressure +----------+--------+-------+----------------+------------+ Subclavian88             Multiphasic, WNL             +----------+--------+-------+----------------+------------+ +---------+--------+--+--------+--+---------+ VertebralPSV cm/s35EDV cm/s11Antegrade +---------+--------+--+--------+--+---------+ Left Carotid Findings: +----------+--------+--------+--------+------------+--------+  PSV cm/sEDV cm/sStenosisDescribe    Comments +----------+--------+--------+--------+------------+--------+ CCA Prox  73      13                                   +----------+--------+--------+--------+------------+--------+ CCA Distal72      19                                   +----------+--------+--------+--------+------------+--------+ ICA Prox  63      19      1-39%   heterogenous         +----------+--------+--------+--------+------------+--------+ ICA Mid   72      20                                   +----------+--------+--------+--------+------------+--------+ ICA Distal51      16                                   +----------+--------+--------+--------+------------+--------+ ECA       225     20                                   +----------+--------+--------+--------+------------+--------+  +----------+--------+--------+----------------+------------+ SubclavianPSV cm/sEDV cm/sDescribe        Arm Pressure +----------+--------+--------+----------------+------------+           176             Multiphasic, WNL             +----------+--------+--------+----------------+------------+ +---------+--------+--+--------+--+---------+ VertebralPSV cm/s37EDV cm/s10Antegrade +---------+--------+--+--------+--+---------+  ABI Findings: +---------+------------------+-----+---------+--------+ Right    Rt Pressure  (mmHg)IndexWaveform Comment  +---------+------------------+-----+---------+--------+ Brachial 125                    triphasic         +---------+------------------+-----+---------+--------+ PTA      158               1.22 triphasic         +---------+------------------+-----+---------+--------+ DP       171               1.32 triphasic         +---------+------------------+-----+---------+--------+ Great Toe144               1.11 Normal            +---------+------------------+-----+---------+--------+ +---------+------------------+-----+---------+-------+ Left     Lt Pressure (mmHg)IndexWaveform Comment +---------+------------------+-----+---------+-------+ Brachial 130                    triphasic        +---------+------------------+-----+---------+-------+ PTA      163               1.25 triphasic        +---------+------------------+-----+---------+-------+ DP       151               1.16 triphasic        +---------+------------------+-----+---------+-------+ Galvin Proffer               0.95 Normal           +---------+------------------+-----+---------+-------+ Arterial wall calcification precludes accurate ankle pressures  and ABIs.  Right Doppler Findings: +--------+--------+-----+---------+--------+ Site    PressureIndexDoppler  Comments +--------+--------+-----+---------+--------+ [id number]          triphasic         +--------+--------+-----+---------+--------+ Radial               triphasic         +--------+--------+-----+---------+--------+ Ulnar                triphasic         +--------+--------+-----+---------+--------+  Left Doppler Findings: +--------+--------+-----+---------+--------+ Site    PressureIndexDoppler  Comments +--------+--------+-----+---------+--------+ [id number]          triphasic         +--------+--------+-----+---------+--------+ Radial               triphasic          +--------+--------+-----+---------+--------+ Ulnar                triphasic         +--------+--------+-----+---------+--------+   Summary: Right Carotid: Velocities in the right ICA are consistent with a 1-39% stenosis. Left Carotid: Velocities in the left ICA are consistent with a 1-39% stenosis. Vertebrals:  Bilateral vertebral arteries demonstrate antegrade flow. Subclavians: Normal flow hemodynamics were seen in bilateral subclavian              arteries. Right ABI: Resting right ankle-brachial index indicates noncompressible right lower extremity arteries. Left ABI: Resting left ankle-brachial index is within normal range. However, arterial calcification may preclude accurate pressures. Right Upper Extremity: Doppler waveforms remain within normal limits with right radial compression. Doppler waveforms remain within normal limits with right ulnar compression. Left Upper Extremity: Doppler waveforms remain within normal limits with left radial compression. Doppler waveforms remain within normal limits with left ulnar compression.  Electronically signed by Harold Barban MD on 01/21/2023 at 10:03:57 PM.    Final    CT HEAD WO CONTRAST (5MM)  Result Date: 01/21/2023 CLINICAL DATA:  Initial evaluation for headache, left-sided neuro deficits. EXAM: CT HEAD WITHOUT CONTRAST TECHNIQUE: Contiguous axial images were obtained from the base of the skull through the vertex without intravenous contrast. RADIATION DOSE REDUCTION: This exam was performed according to the departmental dose-optimization program which includes automated exposure control, adjustment of the mA and/or kV according to patient size and/or use of iterative reconstruction technique. COMPARISON:  None Available. FINDINGS: Brain: Age-related cerebral atrophy with chronic small vessel ischemic disease. No acute intracranial hemorrhage. No acute large vessel territory infarct. No mass lesion, midline shift or mass effect. No hydrocephalus or  extra-axial fluid collection. Vascular: No abnormal hyperdense vessel. Scattered vascular calcifications noted within the carotid siphons. Skull: Scalp soft tissues demonstrate no acute finding. Calvarium intact. Small osteoma noted at the left frontal calvarium. Sinuses/Orbits: Globes orbital soft tissues demonstrate no acute finding. Paranasal sinuses are clear. No mastoid effusion. Other: None. IMPRESSION: 1. No acute intracranial abnormality. 2. Age-related cerebral atrophy with chronic small vessel ischemic disease. Electronically Signed   By: Jeannine Boga M.D.   On: 01/21/2023 21:05   CARDIAC CATHETERIZATION  Result Date: 01/20/2023   Prox RCA lesion is 45% stenosed.   Prox LAD lesion is 35% stenosed.   2nd Diag lesion is 50% stenosed.   Mid LAD lesion is 85% stenosed.   Ramus lesion is 75% stenosed.   1st Mrg lesion is 90% stenosed.   2nd Mrg lesion is 99% stenosed.   LV end diastolic pressure is mildly elevated. Complex 2 vessel obstructive CAD with heavily calcified  vessels. Mildly elevated LVEDP 16 mm Hg Plan; review with heart team approach. Will consult CT surgery. If not felt to be a candidate for surgery could consider PCI with atherectomy of LAD/OM. Need to initiate guideline directed medical therapy.   ECHOCARDIOGRAM COMPLETE  Result Date: 01/19/2023    ECHOCARDIOGRAM REPORT   Patient Name:   Matthew [name] Date of Exam: 01/19/2023 Medical Rec #:  [id number]       Height:       69.0 in Accession #:    [id number]      Weight:       182.9 lb Date of Birth:  Nov 07, 1946        BSA:          1.989 m Patient Age:    77 years        BP:           108/75 mmHg Patient Gender: M               HR:           97 bpm. Exam Location:  [facility] Procedure: 2D Echo, Cardiac Doppler and Color Doppler Indications:    Colon cancer ([place]) (From Hx)  History:        Patient has no prior history of Echocardiogram examinations.                 COPD; Risk Factors:Diabetes and Dyslipidemia. Acute respiratory                  failure with hypoxia ([place]), Colon cancer ([place]) (From Hx).  Sonographer:    Alvino Chapel RCS Referring Phys: [place]  1. Left ventricular ejection fraction, by estimation, is 65 to 70%. The left ventricle has normal function. The left ventricle has no regional wall motion abnormalities. There is mild left ventricular hypertrophy. Left ventricular diastolic parameters are indeterminate. Elevated left atrial pressure.  2. Right ventricular systolic function is normal. The right ventricular size is normal.  3. There is no evidence of cardiac tamponade.  4. The mitral valve is normal in structure. No evidence of mitral valve regurgitation. No evidence of mitral stenosis.  5. The aortic valve is tricuspid. Aortic valve regurgitation is not visualized. No aortic stenosis is present.  6. The inferior vena cava is normal in size with greater than 50% respiratory variability, suggesting right atrial pressure of 3 mmHg. FINDINGS  Left Ventricle: Left ventricular ejection fraction, by estimation, is 65 to 70%. The left ventricle has normal function. The left ventricle has no regional wall motion abnormalities. Definity contrast agent was given IV to delineate the left ventricular  endocardial borders. The left ventricular internal cavity size was normal in size. There is mild left ventricular hypertrophy. Left ventricular diastolic parameters are indeterminate. Elevated left atrial pressure. Right Ventricle: The right ventricular size is normal. Right vetricular wall thickness was not well visualized. Right ventricular systolic function is normal. Left Atrium: Left atrial size was normal in size. Right Atrium: Right atrial size was normal in size. Pericardium: Trivial pericardial effusion is present. The pericardial effusion is circumferential. There is no evidence of cardiac tamponade. Mitral Valve: The mitral valve is normal in structure. No evidence of mitral valve regurgitation. No evidence  of mitral valve stenosis. Tricuspid Valve: The tricuspid valve is normal in structure. Tricuspid valve regurgitation is not demonstrated. No evidence of tricuspid stenosis. Aortic Valve: The aortic valve is tricuspid. Aortic valve regurgitation is not visualized. No aortic  stenosis is present. Aortic valve mean gradient measures 3.7 mmHg. Aortic valve peak gradient measures 7.3 mmHg. Aortic valve area, by VTI measures 2.47 cm. Pulmonic Valve: The pulmonic valve was not well visualized. Pulmonic valve regurgitation is not visualized. No evidence of pulmonic stenosis. Aorta: The aortic root is normal in size and structure. Venous: The inferior vena cava is normal in size with greater than 50% respiratory variability, suggesting right atrial pressure of 3 mmHg. IAS/Shunts: No atrial level shunt detected by color flow Doppler.  LEFT VENTRICLE PLAX 2D LVIDd:         4.70 cm   Diastology LVIDs:         2.60 cm   LV e' medial:    5.66 cm/s LV PW:         1.10 cm   LV E/e' medial:  16.7 LV IVS:        1.10 cm   LV e' lateral:   6.09 cm/s LVOT diam:     2.10 cm   LV E/e' lateral: 15.5 LV SV:         52 LV SV Index:   26 LVOT Area:     3.46 cm  RIGHT VENTRICLE RV S prime:     14.10 cm/s TAPSE (M-mode): 1.6 cm LEFT ATRIUM           Index        RIGHT ATRIUM           Index LA diam:      3.80 cm 1.91 cm/m   RA Area:     13.60 cm LA Vol (A2C): 53.8 ml 27.05 ml/m  RA Volume:   35.90 ml  18.05 ml/m LA Vol (A4C): 54.0 ml 27.15 ml/m  AORTIC VALVE AV Area (Vmax):    2.09 cm AV Area (Vmean):   2.30 cm AV Area (VTI):     2.47 cm AV Vmax:           135.12 cm/s AV Vmean:          90.643 cm/s AV VTI:            0.211 m AV Peak Grad:      7.3 mmHg AV Mean Grad:      3.7 mmHg LVOT Vmax:         81.60 cm/s LVOT Vmean:        60.100 cm/s LVOT VTI:          0.150 m LVOT/AV VTI ratio: 0.71  AORTA Ao Root diam: 3.80 cm MITRAL VALVE MV Area (PHT): 3.27 cm    SHUNTS MV Decel Time: 232 msec    Systemic VTI:  0.15 m MV E velocity: 94.30  cm/s  Systemic Diam: 2.10 cm MV A velocity: 94.70 cm/s MV E/A ratio:  1.00 Carlyle Dolly MD Electronically signed by Carlyle Dolly MD Signature Date/Time: 01/19/2023/1:15:17 PM    Final    US Abdomen Limited RUQ (LIVER/GB)  Result Date: 01/19/2023 CLINICAL DATA:  Abdominal pain EXAM: ULTRASOUND ABDOMEN LIMITED RIGHT UPPER QUADRANT COMPARISON:  CT chest abdomen pelvis 01/19/2023 FINDINGS: Gallbladder: No gallstones or wall thickening visualized. No sonographic Murphy sign noted by sonographer. Common bile duct: Diameter: 4.1 mm Liver: No focal hepatic mass. Heterogeneous hepatic echogenicity with overall increased echogenicity concerning for hepatocellular disease. Portal vein is patent on color Doppler imaging with normal direction of blood flow towards the liver. Other: None. IMPRESSION: 1. No cholelithiasis or sonographic evidence of acute cholecystitis. 2. Heterogeneous hepatic echogenicity with overall  increased echogenicity concerning for hepatocellular disease. Electronically Signed   By: Kathreen Devoid M.D.   On: 01/19/2023 08:22   CT Angio Chest/Abd/Pel for Dissection W and/or Wo Contrast  Result Date: 01/19/2023 CLINICAL DATA:  Chest pain and pressure EXAM: CT ANGIOGRAPHY CHEST, ABDOMEN AND PELVIS TECHNIQUE: Non-contrast CT of the chest was initially obtained. Multidetector CT imaging through the chest, abdomen and pelvis was performed using the standard protocol during bolus administration of intravenous contrast. Multiplanar reconstructed images and MIPs were obtained and reviewed to evaluate the vascular anatomy. RADIATION DOSE REDUCTION: This exam was performed according to the departmental dose-optimization program which includes automated exposure control, adjustment of the mA and/or kV according to patient size and/or use of iterative reconstruction technique. CONTRAST:  186m OMNIPAQUE IOHEXOL 350 MG/ML SOLN COMPARISON:  08/26/2016 abdominal CT FINDINGS: CTA CHEST FINDINGS Cardiovascular:  Preferential opacification of the thoracic aorta. No evidence of thoracic aortic aneurysm or dissection. Normal heart size. Trace pericardial effusion or thickening. Extensive atheromatous calcification of the aorta and coronaries. Mediastinum/Nodes: No hematoma or adenopathy Lungs/Pleura: Centrilobular emphysema. There is also subpleural lower lobe fibrotic changes with honeycombing. Musculoskeletal: Diffuse bridging thoracic osteophytes. Review of the MIP images confirms the above findings. CTA ABDOMEN AND PELVIS FINDINGS VASCULAR Aorta: Extensive atheromatous calcification of the aorta. Infrarenal fusiform aneurysm measuring 2.9 cm, 1.5 times the proximal normal segment. Celiac: Atheromatous plaque at the ostium. No branch vessel beading occlusion, or aneurysm. SMA: Replaced hepatic artery to the SMA. Atheromatous plaque proximally. No acute finding or beading. Renals: Atheromatous plaque at the proximal renal arteries without flow reducing stenosis. Negative for aneurysm or dissection. IMA: Patent Inflow: Scattered atheromatous plaque.  No dissection or aneurysm Veins: Unremarkable in the arterial phase Review of the MIP images confirms the above findings. NON-VASCULAR Lower chest:  No contributory findings. Hepatobiliary: No focal liver abnormality.Cholelithiasis. No evidence of biliary inflammation Pancreas: Fat infiltration.  No acute finding Spleen: Unremarkable. Adrenals/Urinary Tract: Negative adrenals. No hydronephrosis or stone. Bilateral renal cysts, larger at the left interpolar kidney measuring 6.3 cm. Both appear simple with no follow-up imaging recommended. Unremarkable bladder. Stomach/Bowel: Proximal colectomy. No visible bowel inflammation. Sigmoid diverticulosis. Large third portion duodenum diverticulum but uncomplicated appearing Lymphatic: No mass or adenopathy. Reproductive:No acute finding Other: No ascites or pneumoperitoneum. Musculoskeletal: Remote L1 and L3 endplate fractures.  Generalized degeneration and osteopenia. Review of the MIP images confirms the above findings. IMPRESSION: 1. No evidence of acute aortic syndrome. 2. Trace pericardial effusion or thickening since 2017. 3. 2.9 cm infrarenal aortic aneurysm. Recommend follow-up ultrasound every 5 years. This recommendation follows ACR consensus guidelines: White Paper of the ACR Incidental Findings Committee II on Vascular Findings. J Am Coll Radiol 2013;[id number] 4. Aortic Atherosclerosis (ICD10-I70.0) and Emphysema (ICD10-J43.9). Coronary atherosclerosis. 5. Colonic diverticulosis, cholelithiasis, and multilevel spinal ankylosis. Electronically Signed   By: JJorje GuildM.D.   On: 01/19/2023 04:14   DG Chest Portable 1 View  Result Date: 01/19/2023 CLINICAL DATA:  Evaluate for pneumothorax EXAM: PORTABLE CHEST 1 VIEW COMPARISON:  04/26/2021 FINDINGS: Heart and mediastinal contours are within normal limits. No focal opacities or effusions. No acute bony abnormality. Aortic atherosclerosis. No pneumothorax. IMPRESSION: No active cardiopulmonary disease. Electronically Signed   By: KRolm BaptiseM.D.   On: 01/19/2023 03:16       Results for orders placed or performed during the hospital encounter of 01/19/23 (from the past 48 hour(s))  Glucose, capillary     Status: Abnormal   Collection Time: 01/26/23  4:01 PM  Result Value Ref Range   Glucose-Capillary 162 (H) 70 - 99 mg/dL    Comment: Glucose reference range applies only to samples taken after fasting for at least 8 hours.  Glucose, capillary     Status: Abnormal   Collection Time: 01/26/23  9:30 PM  Result Value Ref Range   Glucose-Capillary 276 (H) 70 - 99 mg/dL    Comment: Glucose reference range applies only to samples taken after fasting for at least 8 hours.   Comment 1 Notify RN    Comment 2 Document in Chart   Glucose, capillary     Status: Abnormal   Collection Time: 01/27/23  6:04 AM  Result Value Ref Range   Glucose-Capillary 139 (H) 70 - 99  mg/dL    Comment: Glucose reference range applies only to samples taken after fasting for at least 8 hours.   Comment 1 Notify RN    Comment 2 Document in Chart   Glucose, capillary     Status: Abnormal   Collection Time: 01/27/23 11:34 AM  Result Value Ref Range   Glucose-Capillary 175 (H) 70 - 99 mg/dL    Comment: Glucose reference range applies only to samples taken after fasting for at least 8 hours.  Glucose, capillary     Status: Abnormal   Collection Time: 01/27/23  5:30 PM  Result Value Ref Range   Glucose-Capillary 239 (H) 70 - 99 mg/dL    Comment: Glucose reference range applies only to samples taken after fasting for at least 8 hours.  Glucose, capillary     Status: Abnormal   Collection Time: 01/27/23  9:13 PM  Result Value Ref Range   Glucose-Capillary 177 (H) 70 - 99 mg/dL    Comment: Glucose reference range applies only to samples taken after fasting for at least 8 hours.  Glucose, capillary     Status: Abnormal   Collection Time: 01/28/23  6:12 AM  Result Value Ref Range   Glucose-Capillary 149 (H) 70 - 99 mg/dL    Comment: Glucose reference range applies only to samples taken after fasting for at least 8 hours.    Treatments:  01/23/2023   Surgeon:  Gaye Pollack, MD   First Assistant: Jadene Pierini,  PA-C:    Preoperative Diagnosis:  Severe multi-vessel coronary artery disease     Postoperative Diagnosis:  Same     Procedure:   Median Sternotomy Extracorporeal circulation 3.   Coronary artery bypass grafting x 4   Left internal mammary artery graft to the LAD SVG to diagonal SVG to Ramus SVG to OM1 4.   Endoscopic vein harvest from the right leg     Anesthesia:  General Endotracheal  Discharge Exam: Blood pressure 121/66, pulse 100, temperature 97.8 F (36.6 C), temperature source Oral, resp. rate 16, height '5\' 9"'$  (1.753 m), weight 76.2 kg, SpO2 92 %.     General appearance: alert, cooperative, and no distress Heart: regular rate and  rhythm Lungs: min dim in left base Abdomen: benign Extremities: no edema Wound: incis healing well, right thigh echymosis stable without hematoma Discharge Medications:  The patient has been discharged on:   1.Beta Blocker:  Yes Blue.Reese   ]                              No   [   ]  If No, reason:  2.Ace Inhibitor/ARB: Yes [   ]                                     No  [  n  ]                                     If No, reason:labile BP  3.Statin:   Yes [ y  ]                  No  [   ]                  If No, reason:  4.Ecasa:  Yes  [  y ]                  No   [   ]                  If No, reason:  Patient had ACS upon admission:n  Plavix/P2Y12 inhibitor: Yes [   ]                                      No  [ n  ]     Discharge Instructions     Amb Referral to Cardiac Rehabilitation   Complete by: As directed    Diagnosis: CABG   CABG X ___: 4   After initial evaluation and assessments completed: Virtual Based Care may be provided alone or in conjunction with Phase 2 Cardiac Rehab based on patient barriers.: Yes   Intensive Cardiac Rehabilitation (ICR) [place] location only OR Traditional Cardiac Rehabilitation (TCR) *If criteria for ICR are not met will enroll in TCR [facility] only): Yes   Discharge patient   Complete by: As directed    Discharge disposition: 01-Home or Self Care   Discharge patient date: 01/28/2023      Allergies as of 01/28/2023       Reactions   Bee Venom Anaphylaxis   Ibuprofen Shortness Of Breath, Swelling   Spoke with patient 01/19/23 he assures me no allergy to aspirin, only other NSAIDs   Naproxen Anaphylaxis, Swelling   Prednisone         Medication List     STOP taking these medications    acyclovir 200 MG capsule Commonly known as: ZOVIRAX   albuterol 108 (90 Base) MCG/ACT inhaler Commonly known as: VENTOLIN HFA   DM-Phenylephrine-Acetaminophen 10-5-325 MG/15ML Liqd   lisinopril 5 MG tablet Commonly known  as: ZESTRIL   loperamide 2 MG tablet Commonly known as: IMODIUM A-D   sildenafil 100 MG tablet Commonly known as: VIAGRA       TAKE these medications    acetaminophen 500 MG tablet Commonly known as: TYLENOL Take 1,000 mg by mouth every 8 (eight) hours as needed for moderate pain.   aspirin EC 325 MG tablet Take 1 tablet (325 mg total) by mouth daily.   atorvastatin 80 MG tablet Commonly known as: LIPITOR Take 1 tablet (80 mg total) by mouth at bedtime. What changed:  medication strength how much to take when to take this   Cholecalciferol 50 MCG (2000 UT) Tabs Take 1 tablet by mouth daily.  citalopram 40 MG tablet Commonly known as: CELEXA Take 40 mg by mouth at bedtime.   colchicine 0.6 MG tablet Take 1 tablet (0.6 mg total) by mouth daily.   cyanocobalamin 1000 MCG tablet Commonly known as: VITAMIN B12 Take 1,000 mcg by mouth daily.   cyclobenzaprine 10 MG tablet Commonly known as: FLEXERIL Take 0.5 tablets (5 mg total) by mouth at bedtime. What changed: how much to take   EPINEPHrine 0.3 mg/0.3 mL Soaj injection Commonly known as: EPI-PEN Inject 0.3 mg into the muscle daily as needed (allergic reaction).   Fish Oil 1000 MG Caps Take by mouth.   gabapentin 300 MG capsule Commonly known as: NEURONTIN Take 1 capsule (300 mg total) by mouth 3 (three) times daily. What changed: how much to take   glipiZIDE 10 MG tablet Commonly known as: GLUCOTROL Take 1 tablet by mouth 2 (two) times daily.   Jardiance 25 MG Tabs tablet Generic drug: empagliflozin Take 25 mg by mouth daily.   levothyroxine 50 MCG tablet Commonly known as: SYNTHROID Take 50 mcg by mouth daily before breakfast.   metFORMIN 1000 MG tablet Commonly known as: GLUCOPHAGE Take 1 tablet by mouth 2 (two) times daily.   metoprolol tartrate 25 MG tablet Commonly known as: LOPRESSOR Take 1 tablet (25 mg total) by mouth 2 (two) times daily.   multivitamin tablet Take 1 tablet by  mouth daily.   naloxone 4 MG/0.1ML Liqd nasal spray kit Commonly known as: NARCAN Place into the nose.   omeprazole 20 MG capsule Commonly known as: PRILOSEC Take 20 mg by mouth daily with breakfast.   oxyCODONE 5 MG immediate release tablet Commonly known as: Oxy IR/ROXICODONE Take 1 tablet (5 mg total) by mouth every 6 (six) hours as needed for up to 7 days for severe pain. What changed:  when to take this reasons to take this additional instructions   Ozempic (0.25 or 0.5 MG/DOSE) 2 MG/1.5ML Sopn Generic drug: Semaglutide(0.25 or 0.'5MG'$ /DOS) 0.5 mg once a week.   QUEtiapine 200 MG tablet Commonly known as: SEROQUEL Take 1 tablet (200 mg total) by mouth at bedtime. What changed:  medication strength how much to take   tamsulosin 0.4 MG Caps capsule Commonly known as: FLOMAX Take 0.4 mg by mouth daily.        Follow-up Information     Gaye Pollack, MD Follow up.   Specialty: Cardiothoracic Surgery Why: Please see discharge paperwork for details of follow-up appointment at surgeons office.  Also obtain a chest x-ray 1 hour prior to appointment at [facility]. Contact information: [address] [place] [address] [phone]         [facility] Follow up.   Why: Obtain chest x-ray 1 hour prior to appointment with Dr. Cyndia Bent. Contact information: [place]        [place], Peter M, MD Follow up.   Specialty: Cardiology Why: Please see discharge paperwork for details of follow-up appointment with cardiology.  You  may be seeing an advanced practice provider or other physician.  He is obtain an [facility] Account to keep track of your follow-up appointments with all providers. Contact information: [place] [address] [id number]                 Signed:  John Giovanni, PA-C  01/28/2023, 1:01 PM

## 2023-01-26 NOTE — Progress Notes (Signed)
Mobility Specialist Progress Note:   01/26/23 1412  Mobility  Activity Ambulated with assistance in hallway  Level of Assistance Contact guard assist, steadying assist  Assistive Device Front wheel walker  Distance Ambulated (ft) 150 ft  Activity Response Tolerated well  Mobility Referral Yes  $Mobility charge 1 Mobility   Pt received in bed and agreeable. Pt unsteady but no LOB. C/o fatigue at end of session. Pt returned to chair with all needs met and call bell in reach.   Andrey Campanile Mobility Specialist Please contact via SecureChat or  Rehab office at [phone]

## 2023-01-26 NOTE — Progress Notes (Signed)
Seen pt for ambulation but pt just finished walking with PT. Pt in chair using incentive spirometer waiting for lunch. Pt encouraged to eat, use IS, and continue mobilizing as tolerated. Will see tomorrow.    Christen Bame 01/26/2023 2:18 PM   [id number]

## 2023-01-26 NOTE — Progress Notes (Signed)
Rounding Note    Patient Name: Matthew [name] Date of Encounter: 01/26/2023  [place] Cardiologist: None   Subjective   Sitting up in a chair at the bedside, wanting to get back in bed.  Overall feeling well.  Hungry for breakfast.  No chest pain or shortness of breath.  Inpatient Medications    Scheduled Meds:  acetaminophen  1,000 mg Oral Q6H   Or   acetaminophen (TYLENOL) oral liquid 160 mg/5 mL  1,000 mg Per Tube Q6H   arformoterol  15 mcg Nebulization BID   aspirin EC  325 mg Oral Daily   Or   aspirin  324 mg Per Tube Daily   atorvastatin  80 mg Oral QHS   bisacodyl  10 mg Oral Daily   Or   bisacodyl  10 mg Rectal Daily   budesonide (PULMICORT) nebulizer solution  0.5 mg Nebulization BID   Chlorhexidine Gluconate Cloth  6 each Topical Daily   citalopram  40 mg Oral QHS   colchicine  0.6 mg Oral Daily   cyclobenzaprine  10 mg Oral TID   docusate sodium  200 mg Oral Daily   empagliflozin  25 mg Oral Daily   enoxaparin (LOVENOX) injection  40 mg Subcutaneous QHS   furosemide  40 mg Oral Daily   gabapentin  300 mg Oral TID   glipiZIDE  10 mg Oral BID   insulin aspart  0-15 Units Subcutaneous TID WC   insulin aspart  0-5 Units Subcutaneous QHS   levothyroxine  50 mcg Oral Q0600   metFORMIN  1,000 mg Oral BID WC   metoprolol tartrate  12.5 mg Oral BID   pantoprazole  40 mg Oral Daily   potassium chloride SA  20 mEq Oral Daily   QUEtiapine  200 mg Oral QHS   sodium chloride flush  3 mL Intravenous Q12H   tamsulosin  0.4 mg Oral BID   Continuous Infusions:  sodium chloride Stopped (01/24/23 0955)   sodium chloride 1 mL/hr at 01/24/23 1003   sodium chloride     PRN Meds: sodium chloride, dextrose, metoprolol tartrate, ondansetron (ZOFRAN) IV, mouth rinse, oxyCODONE, sodium chloride flush, traMADol   Vital Signs    Vitals:   01/26/23 0600 01/26/23 0700 01/26/23 0758 01/26/23 0800  BP: 130/81 125/85  98/71  Pulse: 99 (!) 111  (!) 103  Resp:  (!) 24 (!) 23  (!) 22  Temp:      TempSrc:      SpO2: (!) 88% 94% 93% 94%  Weight:      Height:        Intake/Output Summary (Last 24 hours) at 01/26/2023 0812 Last data filed at 01/26/2023 0800 Gross per 24 hour  Intake 1435.28 ml  Output 2745 ml  Net -1309.72 ml      01/26/2023    5:00 AM 01/24/2023    5:00 AM 01/23/2023    5:17 AM  Last 3 Weights  Weight (lbs) 187 lb 9.8 oz 187 lb 9.8 oz 170 lb 3.1 oz  Weight (kg) 85.1 kg 85.1 kg 77.2 kg      Telemetry    Sinus rhythm- Personally Reviewed    Physical Exam  Alert, oriented, no distress GEN: No acute distress.   Neck: No JVD Cardiac: RRR, no murmurs, rubs, or gallops.  Respiratory: Clear to auscultation bilaterally. GI: Soft, nontender, non-distended  MS: No edema; No deformity. Neuro:  Nonfocal  Psych: Normal affect   Labs    High  Sensitivity Troponin:   Recent Labs  Lab 01/19/23 0240 01/19/23 0440 01/19/23 1508 01/19/23 1704  TROPONINIHS 5 5 7 8     $ Chemistry Recent Labs  Lab 01/22/23 0029 01/23/23 0036 01/23/23 0802 01/24/23 0355 01/24/23 1621 01/25/23 0133 01/25/23 0345 01/25/23 0850 01/26/23 0500  NA 132* 134*   < > 132* 131* 130* 132* 136 135  K 4.5 4.3   < > 4.6 4.4 4.7 4.2 4.4 4.4  CL 100 100   < > 102 98 98  --  103 102  CO2 23 21*   < > 22 25 25  $ --  26 24  GLUCOSE 204* 262*   < > 177* 203* 152*  --  129* 194*  BUN 31* 29*   < > 16 12 12  $ --  15 26*  CREATININE 0.89 1.06   < > 0.74 0.69 0.72  --  0.73 0.90  CALCIUM 8.9 8.9   < > 7.6* 8.0* 8.0*  --  8.8* 8.5*  MG 2.2 2.1   < > 2.2 2.0 2.0  --   --   --   PROT 6.4* 6.3*  --   --   --   --   --   --   --   ALBUMIN 3.0* 3.0*  --   --   --   --   --   --   --   AST 22 51*  --   --   --   --   --   --   --   ALT 42 71*  --   --   --   --   --   --   --   ALKPHOS 61 67  --   --   --   --   --   --   --   BILITOT 0.5 0.4  --   --   --   --   --   --   --   GFRNONAA >60 >60   < > >60 >60 >60  --  >60 >60  ANIONGAP 9 13   < > 8 8 7  $ --  7 9    < > = values in this interval not displayed.    Lipids  Recent Labs  Lab 01/20/23 0102  CHOL 123  TRIG 89  HDL 51  LDLCALC 54  CHOLHDL 2.4    Hematology Recent Labs  Lab 01/24/23 1621 01/25/23 0133 01/25/23 0345 01/25/23 0850 01/26/23 0500  WBC 13.5* 12.7*  --   --  9.5  RBC 3.28* 3.22*  --   --  2.79*  HGB 10.8* 10.5* 8.2* 8.8* 9.2*  HCT 32.2* 31.0* 24.0* 25.8* 27.3*  MCV 98.2 96.3  --   --  97.8  MCH 32.9 32.6  --   --  33.0  MCHC 33.5 33.9  --   --  33.7  RDW 13.7 13.7  --   --  13.8  PLT 145* 139*  --   --  127*   Thyroid  Recent Labs  Lab 01/19/23 1103  TSH 2.513    BNPNo results for input(s): "BNP", "PROBNP" in the last 168 hours.  DDimer No results for input(s): "DDIMER" in the last 168 hours.   Radiology    DG CHEST PORT 1 VIEW  Result Date: 01/25/2023 CLINICAL DATA:  Hypotension EXAM: PORTABLE CHEST 1 VIEW COMPARISON:  01/24/2023 FINDINGS: Swan-Ganz catheter and mediastinal drain have been removed. Right internal jugular  Cordis introducer is unchanged. Left chest tube in place. No pneumothorax or pleural effusion. Pulmonary insufflation is stable. Retrocardiac opacity has progressed in keeping with atelectasis or infiltrate within this region. Coronary artery bypass grafting has been performed. Cardiac size is mildly enlarged, unchanged. Pulmonary vascularity is normal. IMPRESSION: 1. Left chest tube in place. No pneumothorax. 2. Progressive retrocardiac opacity in keeping with atelectasis or infiltrate. Electronically Signed   By: Fidela Salisbury M.D.   On: 01/25/2023 03:42     Patient Profile     77 y.o. male with unstable angina, found to have severe MV CAD on cath 2/6, underwent MV CABG 2/9 with LIMA-LAD, SVG-diag, ramus, and OM1.  Assessment & Plan    Unstable angina, now POD#3 from multivessel CABG. progressing well.  Continue aspirin, high-dose atorvastatin, metoprolol, and Jardiance Type II DM: Management per surgical team Mixed hyperlipidemia:  LDL 54 on pravastatin as OP. Now on atorvastatin 80 mg daily.  Overall progressing well, rhythm stable, cardiac medications as outlined above.      For questions or updates, please contact [place] Please consult [email or web address] for contact info under        Signed, Sherren Mocha, MD  01/26/2023, 8:12 AM

## 2023-01-26 NOTE — Anesthesia Postprocedure Evaluation (Signed)
Anesthesia Post Note  Patient: Matthew [name]  Procedure([name]) Performed: CORONARY ARTERY BYPASS GRAFTING TIMES FOUR USING LEFT INTERNAL MAMMARY ARTERY AND RIGHT GREATER SAPHENOUS VEIN (Chest) TRANSESOPHAGEAL ECHOCARDIOGRAM ENDOVEIN HARVEST OF RIGHT GREATER SAPHENOUS VEIN (Right)     Patient location during evaluation: SICU Anesthesia Type: General Level of consciousness: sedated Pain management: pain level controlled Vital Signs Assessment: post-procedure vital signs reviewed and stable Respiratory status: patient remains intubated per anesthesia plan Cardiovascular status: stable Postop Assessment: no apparent nausea or vomiting Anesthetic complications: no  No notable events documented.  Last Vitals:  Vitals:   01/26/23 0600 01/26/23 0700  BP: 130/81 125/85  Pulse: 99 (!) 111  Resp: (!) 24 (!) 23  Temp:    SpO2: (!) 88% 94%    Last Pain:  Vitals:   01/26/23 0500  TempSrc: Oral  PainSc:                  Matthew [name]

## 2023-01-26 NOTE — Progress Notes (Signed)
3 Days Post-Op Procedure(s) (LRB): CORONARY ARTERY BYPASS GRAFTING TIMES FOUR USING LEFT INTERNAL MAMMARY ARTERY AND RIGHT GREATER SAPHENOUS VEIN (N/A) TRANSESOPHAGEAL ECHOCARDIOGRAM (N/A) ENDOVEIN HARVEST OF RIGHT GREATER SAPHENOUS VEIN (Right) Subjective: No complaints. Sitting up in chair. Had BM this am.  Objective: Vital signs in last 24 hours: Temp:  [97.5 F (36.4 C)-97.9 F (36.6 C)] 97.9 F (36.6 C) (02/12 0500) Pulse Rate:  [87-111] 103 (02/12 0800) Cardiac Rhythm: Sinus tachycardia;Normal sinus rhythm (02/12 0800) Resp:  [13-35] 22 (02/12 0800) BP: (98-139)/(60-92) 98/71 (02/12 0800) SpO2:  [85 %-98 %] 94 % (02/12 0800) Arterial Line BP: (93-180)/(46-113) 129/69 (02/12 0800) Weight:  [85.1 kg] 85.1 kg (02/12 0500)  Hemodynamic parameters for last 24 hours:    Intake/Output from previous day: 02/11 0701 - 02/12 0700 In: 1195.3 [P.O.:720; I.V.:475.3] Out: 2910 [Urine:2870; Chest Tube:40] Intake/Output this shift: Total I/O In: 240 [P.O.:240] Out: -   General appearance: alert and cooperative Neurologic: intact Heart: regular rate and rhythm Lungs: decreased BS left base.  Extremities: no edema Wound: incisions ok  Lab Results: Recent Labs    01/25/23 0133 01/25/23 0345 01/25/23 0850 01/26/23 0500  WBC 12.7*  --   --  9.5  HGB 10.5*   < > 8.8* 9.2*  HCT 31.0*   < > 25.8* 27.3*  PLT 139*  --   --  127*   < > = values in this interval not displayed.   BMET:  Recent Labs    01/25/23 0850 01/26/23 0500  NA 136 135  K 4.4 4.4  CL 103 102  CO2 26 24  GLUCOSE 129* 194*  BUN 15 26*  CREATININE 0.73 0.90  CALCIUM 8.8* 8.5*    PT/INR:  Recent Labs    01/23/23 1336  LABPROT 16.6*  INR 1.4*   ABG    Component Value Date/Time   PHART 7.384 01/25/2023 0345   HCO3 22.5 01/25/2023 0345   TCO2 24 01/25/2023 0345   ACIDBASEDEF 2.0 01/25/2023 0345   O2SAT 99 01/25/2023 0345   CBG (last 3)  Recent Labs    01/25/23 1558 01/25/23 2114  01/26/23 0608  GLUCAP 333* 213* 224*   CXR: left basilar atelectasis  Assessment/Plan: S/P Procedure(s) (LRB): CORONARY ARTERY BYPASS GRAFTING TIMES FOUR USING LEFT INTERNAL MAMMARY ARTERY AND RIGHT GREATER SAPHENOUS VEIN (N/A) TRANSESOPHAGEAL ECHOCARDIOGRAM (N/A) ENDOVEIN HARVEST OF RIGHT GREATER SAPHENOUS VEIN (Right)  POD 3 Hemodynamics stable after episode of hypotension this weekend that did not respond quickly to fluid and Neo. He has not had any further episodes. I wonder if it was related to multiple meds he has been on that have potential vasodilator effect including Seroquel, Celexa, Neurontin, Flexeril, Flomax. Will continue low dose Lopressor and observe.  DC pacing wires, then arterial line and sleeve.  DM: glucose up last night probably due to prednisone. Will stop it. Continue his home regimen minus Ozempic and continue SSI.   Wt is still above preop. Continue daily lasix and KCL.  Transfer to 4E and continue mobilization.     LOS: 7 days    Gaye Pollack 01/26/2023

## 2023-01-27 ENCOUNTER — Other Ambulatory Visit (HOSPITAL_COMMUNITY): Payer: Self-pay

## 2023-01-27 DIAGNOSIS — I200 Unstable angina: Secondary | ICD-10-CM | POA: Diagnosis not present

## 2023-01-27 LAB — GLUCOSE, CAPILLARY
Glucose-Capillary: 139 mg/dL — ABNORMAL HIGH (ref 70–99)
Glucose-Capillary: 175 mg/dL — ABNORMAL HIGH (ref 70–99)
Glucose-Capillary: 239 mg/dL — ABNORMAL HIGH (ref 70–99)
Glucose-Capillary: 177 mg/dL — ABNORMAL HIGH (ref 70–99)

## 2023-01-27 MED ORDER — METOPROLOL TARTRATE 25 MG PO TABS
25.0000 mg | ORAL_TABLET | Freq: Two times a day (BID) | ORAL | Status: DC
  Administered 2023-01-27 – 2023-01-28 (×3): 25 mg via ORAL
  Filled 2023-01-27 (×3): qty 1

## 2023-01-27 NOTE — Progress Notes (Signed)
CARDIAC REHAB PHASE I   PRE:  Rate/Rhythm: 95 NSR  BP:  Sitting: 116/77      SaO2: 92 RA  MODE:  Ambulation: 135  ft   AD:  RW  POST:  Rate/Rhythm: 112 ST  BP:  Sitting: 99/64      SaO2: 96 RA  Pt amb with handheld and standby assistance, pt denies CP and reports min-mod SOB during amb and was returned to room w/o complaint.   Pt needed x2 standing rest breaks while amb, pt was helped to BR after amb-BM successful and returned to bed. Pt fatigued after activity. Pt was encouraged to rest and use IS.    Matthew [name]  1:53 PM 01/27/2023    Service time is from 1330 to 1356.

## 2023-01-27 NOTE — Care Management Important Message (Signed)
Important Message  Patient Details  Name: JANSSEN PULS MRN: [id number] Date of Birth: December 12, 1946   Medicare Important Message Given:  Yes     Shelda Altes 01/27/2023, 10:39 AM

## 2023-01-27 NOTE — TOC Benefit Eligibility Note (Signed)
Patient Teacher, English as a foreign language completed.    The patient is currently admitted and upon discharge could be taking colchicine 0.6 mg tablets.  The current 30 day co-pay is $4.41.   The patient is insured through [place], [place] Patient Advocate Specialist [place] Patient Advocate Team Direct Number: [phone]  Fax: [phone]

## 2023-01-27 NOTE — Progress Notes (Addendum)
[address]       [place] [name] [id number]             [phone]     4 Days Post-Op Procedure(s) (LRB): CORONARY ARTERY BYPASS GRAFTING TIMES FOUR USING LEFT INTERNAL MAMMARY ARTERY AND RIGHT GREATER SAPHENOUS VEIN (N/A) TRANSESOPHAGEAL ECHOCARDIOGRAM (N/A) ENDOVEIN HARVEST OF RIGHT GREATER SAPHENOUS VEIN (Right) Subjective: Feels ok, no specific c/o  Objective: Vital signs in last 24 hours: Temp:  [97.5 F (36.4 C)-98 F (36.7 C)] 98 F (36.7 C) (02/12 2335) Pulse Rate:  [98-117] 98 (02/12 2335) Cardiac Rhythm: Sinus tachycardia (02/12 1900) Resp:  [18-26] 19 (02/12 2335) BP: (98-126)/(57-81) 112/57 (02/12 2335) SpO2:  [90 %-98 %] 91 % (02/12 2335) Arterial Line BP: (100-150)/(69-108) 150/79 (02/12 0900) Weight:  [80.3 kg] 80.3 kg (02/12 1120)  Hemodynamic parameters for last 24 hours:    Intake/Output from previous day: 02/12 0701 - 02/13 0700 In: 480 [P.O.:480] Out: 2100 [Urine:2100] Intake/Output this shift: No intake/output data recorded.  General appearance: alert, cooperative, and no distress Heart: regular rate and rhythm and tachy Lungs: min dim in bases Abdomen: benign Extremities: no edema Wound: mod echymosis right thigh, no hematoma, so signs of infection, sternal incis ok  Lab Results: Recent Labs    01/25/23 0133 01/25/23 0345 01/25/23 0850 01/26/23 0500  WBC 12.7*  --   --  9.5  HGB 10.5*   < > 8.8* 9.2*  HCT 31.0*   < > 25.8* 27.3*  PLT 139*  --   --  127*   < > = values in this interval not displayed.   BMET:  Recent Labs    01/25/23 0850 01/26/23 0500  NA 136 135  K 4.4 4.4  CL 103 102  CO2 26 24  GLUCOSE 129* 194*  BUN 15 26*  CREATININE 0.73 0.90  CALCIUM 8.8* 8.5*    PT/INR: No results for input(s): "LABPROT", "INR" in the last 72 hours. ABG    Component Value Date/Time   PHART 7.384 01/25/2023 0345   HCO3 22.5 01/25/2023 0345   TCO2 24 01/25/2023 0345   ACIDBASEDEF 2.0 01/25/2023 0345   O2SAT 99  01/25/2023 0345   CBG (last 3)  Recent Labs    01/26/23 1601 01/26/23 2130 01/27/23 0604  GLUCAP 162* 276* 139*    Meds Scheduled Meds:  acetaminophen  1,000 mg Oral Q6H   Or   acetaminophen (TYLENOL) oral liquid 160 mg/5 mL  1,000 mg Per Tube Q6H   arformoterol  15 mcg Nebulization BID   aspirin EC  325 mg Oral Daily   atorvastatin  80 mg Oral QHS   budesonide (PULMICORT) nebulizer solution  0.5 mg Nebulization BID   citalopram  40 mg Oral QHS   colchicine  0.6 mg Oral Daily   cyclobenzaprine  5 mg Oral TID   empagliflozin  25 mg Oral Daily   enoxaparin (LOVENOX) injection  40 mg Subcutaneous QHS   furosemide  40 mg Oral Daily   gabapentin  300 mg Oral TID   glipiZIDE  10 mg Oral BID   insulin aspart  0-15 Units Subcutaneous TID WC   insulin aspart  0-5 Units Subcutaneous QHS   levothyroxine  50 mcg Oral Q0600   metFORMIN  1,000 mg Oral BID WC   metoprolol tartrate  12.5 mg Oral BID   pantoprazole  40 mg Oral Daily   potassium chloride SA  20 mEq Oral Daily   QUEtiapine  200 mg Oral QHS   sodium chloride flush  3 mL Intravenous Q12H   tamsulosin  0.4 mg Oral Daily   Continuous Infusions:  sodium chloride     PRN Meds:.sodium chloride, mouth rinse, oxyCODONE, sodium chloride flush, traMADol  Xrays DG Chest Port 1 View  Result Date: 01/26/2023 CLINICAL DATA:  Status post CABG. EXAM: PORTABLE CHEST 1 VIEW COMPARISON:  01/25/2023 FINDINGS: RIGHT IJ sheath remains in place. Status post median sternotomy. Heart is enlarged, stable in configuration. LEFT-sided chest tube has been removed. There is no pneumothorax. RIGHT lung is clear. There is minimal subsegmental atelectasis or consolidation with small pleural effusion at the LEFT lung base. IMPRESSION: Interval removal of LEFT-sided chest tube. No pneumothorax. Electronically Signed   By: Nolon Nations M.D.   On: 01/26/2023 08:12    Assessment/Plan: S/P Procedure(s) (LRB): CORONARY ARTERY BYPASS GRAFTING TIMES FOUR  USING LEFT INTERNAL MAMMARY ARTERY AND RIGHT GREATER SAPHENOUS VEIN (N/A) TRANSESOPHAGEAL ECHOCARDIOGRAM (N/A) ENDOVEIN HARVEST OF RIGHT GREATER SAPHENOUS VEIN (Right) POD#4  1 afeb, s BP 98-126 range, sinus rhythm/tachy, will increase beta blocker a little 2 sats ok on 0-2 liters 3 good UOP, weight below preop now 4 BS elevated 139-276 range- on home meds and SSI, prednisone has been stopped- cont to observe  5 routine pulm hygiene and rehab      LOS: 8 days    Matthew Giovanni PA-C Pager [phone] 01/27/2023  Chart reviewed, patient examined, agree with above. He feels fairly well overall. Continue to ambulate and wean off oxygen. Wt is below preop and he has no edema so can stop lasix and KCL.

## 2023-01-27 NOTE — Progress Notes (Signed)
Mobility Specialist Progress Note   01/27/23 1000  Mobility  Activity Ambulated with assistance in hallway  Level of Assistance Contact guard assist, steadying assist  Assistive Device Front wheel walker  Distance Ambulated (ft) 120 ft  Range of Motion/Exercises Active;All extremities  Activity Response Tolerated well   Pre Mobility:  HR 94, SpO2 97% RA During Mobility: HR 110 +/-, SpO2 >90% RA Post Mobility: HR 107, SpO2 93% RA  Patient received in supine and agreeable to participate. Prior to ambulation, reviewed sternal precautions with receptive teach back. Was independent for bed mobility but required cues to adhere to precautions. Stood with minimal HHA + cues for hand placement. Ambulated min guard with slow steady gait. Oxygen saturation maintained >90% throughout. Returned to room without complaint or incident. Was left in supine with all needs met, call bell in reach.   Matthew [name], BS EXP Mobility Specialist Please contact via SecureChat or Rehab office at [phone]

## 2023-01-27 NOTE — Progress Notes (Addendum)
Rounding Note    Patient Name: Matthew [name] Date of Encounter: 01/27/2023  [place] Cardiologist: None   Subjective   Feeling quite well this morning. Eager to ambulate.   Inpatient Medications    Scheduled Meds:  acetaminophen  1,000 mg Oral Q6H   Or   acetaminophen (TYLENOL) oral liquid 160 mg/5 mL  1,000 mg Per Tube Q6H   arformoterol  15 mcg Nebulization BID   aspirin EC  325 mg Oral Daily   atorvastatin  80 mg Oral QHS   budesonide (PULMICORT) nebulizer solution  0.5 mg Nebulization BID   citalopram  40 mg Oral QHS   colchicine  0.6 mg Oral Daily   cyclobenzaprine  5 mg Oral TID   empagliflozin  25 mg Oral Daily   enoxaparin (LOVENOX) injection  40 mg Subcutaneous QHS   gabapentin  300 mg Oral TID   glipiZIDE  10 mg Oral BID   insulin aspart  0-15 Units Subcutaneous TID WC   insulin aspart  0-5 Units Subcutaneous QHS   levothyroxine  50 mcg Oral Q0600   metFORMIN  1,000 mg Oral BID WC   metoprolol tartrate  25 mg Oral BID   pantoprazole  40 mg Oral Daily   QUEtiapine  200 mg Oral QHS   sodium chloride flush  3 mL Intravenous Q12H   tamsulosin  0.4 mg Oral Daily   Continuous Infusions:  sodium chloride     PRN Meds: sodium chloride, mouth rinse, oxyCODONE, sodium chloride flush, traMADol   Vital Signs    Vitals:   01/26/23 2335 01/27/23 0805 01/27/23 0810 01/27/23 0841  BP: (!) 112/57   (!) 144/82  Pulse: 98   (!) 107  Resp: 19   14  Temp: 98 F (36.7 C)   98.2 F (36.8 C)  TempSrc: Oral   Oral  SpO2: 91% 100% 100% 94%  Weight:      Height:        Intake/Output Summary (Last 24 hours) at 01/27/2023 0948 Last data filed at 01/26/2023 2000 Gross per 24 hour  Intake 240 ml  Output 1800 ml  Net -1560 ml      01/26/2023   11:20 AM 01/26/2023    5:00 AM 01/24/2023    5:00 AM  Last 3 Weights  Weight (lbs) 177 lb 0.5 oz 187 lb 9.8 oz 187 lb 9.8 oz  Weight (kg) 80.3 kg 85.1 kg 85.1 kg      Telemetry    Sinus Rhythm,  intermittent sinus tachycardia - Personally Reviewed  ECG    No new tracing  Physical Exam   GEN: No acute distress.   Neck: No JVD Cardiac: RRR, no murmurs, rubs, or gallops. Well healing sternotomy  Respiratory: Clear to auscultation bilaterally. GI: Soft, nontender, non-distended  MS: No edema; No deformity. Neuro:  Nonfocal  Psych: Normal affect   Labs    High Sensitivity Troponin:   Recent Labs  Lab 01/19/23 0240 01/19/23 0440 01/19/23 1508 01/19/23 1704  TROPONINIHS 5 5 7 8     $ Chemistry Recent Labs  Lab 01/22/23 0029 01/23/23 0036 01/23/23 0802 01/24/23 0355 01/24/23 1621 01/25/23 0133 01/25/23 0345 01/25/23 0850 01/26/23 0500  NA 132* 134*   < > 132* 131* 130* 132* 136 135  K 4.5 4.3   < > 4.6 4.4 4.7 4.2 4.4 4.4  CL 100 100   < > 102 98 98  --  103 102  CO2 23 21*   < >  $22 25 25  z$ --  26 24  GLUCOSE 204* 262*   < > 177* 203* 152*  --  129* 194*  BUN 31* 29*   < > 16 12 12  $ --  15 26*  CREATININE 0.89 1.06   < > 0.74 0.69 0.72  --  0.73 0.90  CALCIUM 8.9 8.9   < > 7.6* 8.0* 8.0*  --  8.8* 8.5*  MG 2.2 2.1   < > 2.2 2.0 2.0  --   --   --   PROT 6.4* 6.3*  --   --   --   --   --   --   --   ALBUMIN 3.0* 3.0*  --   --   --   --   --   --   --   AST 22 51*  --   --   --   --   --   --   --   ALT 42 71*  --   --   --   --   --   --   --   ALKPHOS 61 67  --   --   --   --   --   --   --   BILITOT 0.5 0.4  --   --   --   --   --   --   --   GFRNONAA >60 >60   < > >60 >60 >60  --  >60 >60  ANIONGAP 9 13   < > 8 8 7  $ --  7 9   < > = values in this interval not displayed.    Lipids No results for input(s): "CHOL", "TRIG", "HDL", "LABVLDL", "LDLCALC", "CHOLHDL" in the last 168 hours.  Hematology Recent Labs  Lab 01/24/23 1621 01/25/23 0133 01/25/23 0345 01/25/23 0850 01/26/23 0500  WBC 13.5* 12.7*  --   --  9.5  RBC 3.28* 3.22*  --   --  2.79*  HGB 10.8* 10.5* 8.2* 8.8* 9.2*  HCT 32.2* 31.0* 24.0* 25.8* 27.3*  MCV 98.2 96.3  --   --  97.8  MCH 32.9  32.6  --   --  33.0  MCHC 33.5 33.9  --   --  33.7  RDW 13.7 13.7  --   --  13.8  PLT 145* 139*  --   --  127*   Thyroid No results for input(s): "TSH", "FREET4" in the last 168 hours.  BNPNo results for input(s): "BNP", "PROBNP" in the last 168 hours.  DDimer No results for input(s): "DDIMER" in the last 168 hours.   Radiology    DG Chest Port 1 View  Result Date: 01/26/2023 CLINICAL DATA:  Status post CABG. EXAM: PORTABLE CHEST 1 VIEW COMPARISON:  01/25/2023 FINDINGS: RIGHT IJ sheath remains in place. Status post median sternotomy. Heart is enlarged, stable in configuration. LEFT-sided chest tube has been removed. There is no pneumothorax. RIGHT lung is clear. There is minimal subsegmental atelectasis or consolidation with small pleural effusion at the LEFT lung base. IMPRESSION: Interval removal of LEFT-sided chest tube. No pneumothorax. Electronically Signed   By: Nolon Nations M.D.   On: 01/26/2023 08:12     Patient Profile     77 y.o. male  with unstable angina, found to have severe MV CAD on cath 2/6, underwent MV CABG 2/9 with LIMA-LAD, SVG-diag, ramus, and OM1.   Assessment & Plan    Unstable Angina CAD s/p 4v CABG 2/6 --  medical regimen includes ASA 327m, statin, BB -- working with CR  Sinus Tachycardia -- HR elevated last night into this morning -- metoprolol increased to 248mBID  HLD -- LDL 54, HDL 51 -- on high dose statin  DM -- Hgb A1c 6.7 -- on SSI and jardiance    For questions or updates, please contact [place] consult [email or web address] for contact info under   Signed, LiReino BellisNP  01/27/2023, 9:48 AM    Patient seen, examined. Available data reviewed. Agree with findings, assessment, and plan as outlined by LiReino BellisNP. Alert, oriented, NAD. Lungs CTA, heart RRR no murmur, abd: soft, NT, extremities without edema. Tele shows NSR/sinus tach at times. Overall progressing very well now. Meds as outlined above. HR  currently 77 bpm on increased dose of metoprolol.  MiSherren MochaM.D. 01/27/2023 1:01 PM

## 2023-01-28 ENCOUNTER — Encounter (HOSPITAL_COMMUNITY): Payer: Self-pay

## 2023-01-28 ENCOUNTER — Ambulatory Visit (HOSPITAL_COMMUNITY): Payer: No Typology Code available for payment source

## 2023-01-28 DIAGNOSIS — Z951 Presence of aortocoronary bypass graft: Secondary | ICD-10-CM

## 2023-01-28 LAB — BPAM RBC
Blood Product Expiration Date: 202403042359
Blood Product Expiration Date: 202403042359
Blood Product Expiration Date: 202403042359
Blood Product Expiration Date: 202403042359
ISSUE DATE / TIME: 202402121608
ISSUE DATE / TIME: 202402122024
Unit Type and Rh: 6200
Unit Type and Rh: 6200
Unit Type and Rh: 6200
Unit Type and Rh: 6200

## 2023-01-28 LAB — TYPE AND SCREEN
ABO/RH(D): A NEG
Antibody Screen: NEGATIVE
Unit division: 0
Unit division: 0
Unit division: 0
Unit division: 0

## 2023-01-28 LAB — GLUCOSE, CAPILLARY: Glucose-Capillary: 149 mg/dL — ABNORMAL HIGH (ref 70–99)

## 2023-01-28 MED ORDER — ATORVASTATIN CALCIUM 80 MG PO TABS: 80.0000 mg | ORAL_TABLET | Freq: Every day | ORAL | 1 refills | Status: AC

## 2023-01-28 MED ORDER — CYCLOBENZAPRINE HCL 10 MG PO TABS: 5.0000 mg | ORAL_TABLET | Freq: Every day | ORAL | 0 refills | Status: AC

## 2023-01-28 MED ORDER — QUETIAPINE FUMARATE 200 MG PO TABS: 200.0000 mg | ORAL_TABLET | Freq: Every day | ORAL | Status: AC

## 2023-01-28 MED ORDER — METOPROLOL TARTRATE 25 MG PO TABS: 25.0000 mg | ORAL_TABLET | Freq: Two times a day (BID) | ORAL | 1 refills | Status: DC

## 2023-01-28 MED ORDER — GABAPENTIN 300 MG PO CAPS: 300.0000 mg | ORAL_CAPSULE | Freq: Three times a day (TID) | ORAL | Status: AC

## 2023-01-28 MED ORDER — ASPIRIN 325 MG PO TBEC: 325.0000 mg | DELAYED_RELEASE_TABLET | Freq: Every day | ORAL | Status: DC

## 2023-01-28 MED ORDER — COLCHICINE 0.6 MG PO TABS: 0.6000 mg | ORAL_TABLET | Freq: Every day | ORAL | 0 refills | Status: DC

## 2023-01-28 MED ORDER — OXYCODONE HCL 5 MG PO TABS: 5.0000 mg | ORAL_TABLET | Freq: Four times a day (QID) | ORAL | 0 refills | Status: AC | PRN

## 2023-01-28 MED FILL — Magnesium Sulfate Inj 50%: INTRAMUSCULAR | Qty: 10 | Status: AC

## 2023-01-28 MED FILL — Heparin Sodium (Porcine) Inj 1000 Unit/ML: INTRAMUSCULAR | Qty: 20 | Status: AC

## 2023-01-28 MED FILL — Lidocaine HCl Local Soln Prefilled Syringe 100 MG/5ML (2%): INTRAMUSCULAR | Qty: 5 | Status: AC

## 2023-01-28 MED FILL — Electrolyte-R (PH 7.4) Solution: INTRAVENOUS | Qty: 3000 | Status: AC

## 2023-01-28 MED FILL — Potassium Chloride Inj 2 mEq/ML: INTRAVENOUS | Qty: 40 | Status: AC

## 2023-01-28 MED FILL — Heparin Sodium (Porcine) Inj 1000 Unit/ML: Qty: 1000 | Status: AC

## 2023-01-28 MED FILL — Sodium Chloride IV Soln 0.9%: INTRAVENOUS | Qty: 2000 | Status: AC

## 2023-01-28 MED FILL — Albumin, Human Inj 5%: INTRAVENOUS | Qty: 250 | Status: AC

## 2023-01-28 MED FILL — Mannitol IV Soln 20%: INTRAVENOUS | Qty: 500 | Status: AC

## 2023-01-28 MED FILL — Sodium Bicarbonate IV Soln 8.4%: INTRAVENOUS | Qty: 50 | Status: AC

## 2023-01-28 NOTE — Progress Notes (Addendum)
[place]       [employer] [address]             [phone]     5 Days Post-Op Procedure(s) (LRB): CORONARY ARTERY BYPASS GRAFTING TIMES FOUR USING LEFT INTERNAL MAMMARY ARTERY AND RIGHT GREATER SAPHENOUS VEIN (N/A) TRANSESOPHAGEAL ECHOCARDIOGRAM (N/A) ENDOVEIN HARVEST OF RIGHT GREATER SAPHENOUS VEIN (Right) Subjective: Feels ok, no specific c/o  Objective: Vital signs in last 24 hours: Temp:  [97.7 F (36.5 C)-98.3 F (36.8 C)] 98.1 F (36.7 C) (02/14 0520) Pulse Rate:  [87-107] 100 (02/14 0520) Cardiac Rhythm: Sinus tachycardia (02/13 2023) Resp:  [14-20] 20 (02/14 0617) BP: (106-144)/(59-85) 123/81 (02/14 0520) SpO2:  [93 %-100 %] 94 % (02/14 0520) Weight:  [76.2 kg] 76.2 kg (02/14 0617)  Hemodynamic parameters for last 24 hours:    Intake/Output from previous day: 02/13 0701 - 02/14 0700 In: -  Out: 900 [Urine:900] Intake/Output this shift: No intake/output data recorded.  General appearance: alert, cooperative, and no distress Heart: regular rate and rhythm Lungs: min dim in left base Abdomen: benign Extremities: no edema Wound: incis healing well, right thigh echymosis stable without hematoma  Lab Results: Recent Labs    01/25/23 0850 01/26/23 0500  WBC  --  9.5  HGB 8.8* 9.2*  HCT 25.8* 27.3*  PLT  --  127*   BMET:  Recent Labs    01/25/23 0850 01/26/23 0500  NA 136 135  K 4.4 4.4  CL 103 102  CO2 26 24  GLUCOSE 129* 194*  BUN 15 26*  CREATININE 0.73 0.90  CALCIUM 8.8* 8.5*    PT/INR: No results for input(s): "LABPROT", "INR" in the last 72 hours. ABG    Component Value Date/Time   PHART 7.384 01/25/2023 0345   HCO3 22.5 01/25/2023 0345   TCO2 24 01/25/2023 0345   ACIDBASEDEF 2.0 01/25/2023 0345   O2SAT 99 01/25/2023 0345   CBG (last 3)  Recent Labs    01/27/23 1730 01/27/23 2113 01/28/23 0612  GLUCAP 239* 177* 149*    Meds Scheduled Meds:  acetaminophen  1,000 mg Oral Q6H   Or   acetaminophen  (TYLENOL) oral liquid 160 mg/5 mL  1,000 mg Per Tube Q6H   arformoterol  15 mcg Nebulization BID   aspirin EC  325 mg Oral Daily   atorvastatin  80 mg Oral QHS   budesonide (PULMICORT) nebulizer solution  0.5 mg Nebulization BID   citalopram  40 mg Oral QHS   colchicine  0.6 mg Oral Daily   cyclobenzaprine  5 mg Oral TID   empagliflozin  25 mg Oral Daily   enoxaparin (LOVENOX) injection  40 mg Subcutaneous QHS   gabapentin  300 mg Oral TID   glipiZIDE  10 mg Oral BID   insulin aspart  0-15 Units Subcutaneous TID WC   insulin aspart  0-5 Units Subcutaneous QHS   levothyroxine  50 mcg Oral Q0600   metFORMIN  1,000 mg Oral BID WC   metoprolol tartrate  25 mg Oral BID   pantoprazole  40 mg Oral Daily   QUEtiapine  200 mg Oral QHS   sodium chloride flush  3 mL Intravenous Q12H   tamsulosin  0.4 mg Oral Daily   Continuous Infusions:  sodium chloride     PRN Meds:.sodium chloride, mouth rinse, oxyCODONE, sodium chloride flush, traMADol  Xrays No results found.  Assessment/Plan: S/P Procedure(s) (LRB): CORONARY ARTERY BYPASS GRAFTING TIMES FOUR USING LEFT INTERNAL MAMMARY  ARTERY AND RIGHT GREATER SAPHENOUS VEIN (N/A) TRANSESOPHAGEAL ECHOCARDIOGRAM (N/A) ENDOVEIN HARVEST OF RIGHT GREATER SAPHENOUS VEIN (Right) POD#5  1 afeb, s BP 100's-140's, SR/ST, low 100's 2 O2 sats are good on RA 3 doing well with ambulation, does fatigue fairly easily 4 good urine output, not all recorded 5 BS improving control off of prednisone, on home meds, will need close f/u w/primary long term, most recent Hg A1C 6.7 6 stable for D/C     LOS: 9 days    John Giovanni PA-C Pager [phone] 01/28/2023   Chart reviewed, patient examined, agree with above. He looks good. Sats ok off oxygen. Wt is below preop. I think he can go home today. He is in agreement.

## 2023-01-28 NOTE — Progress Notes (Signed)
Discharge instructions reviewed with pt and his wife.  Both verbalized understanding of instructions.  Copy of instructions given to pt. Informed scripts sent to his pharmacy for pick up. Pt getting dressed, daughter here at main entrance for pt and wife pick up.  Pt to be d/c'd via wheelchair with belongings, with wife.            To be escorted by hospital volunteer.

## 2023-01-28 NOTE — Progress Notes (Signed)
Discussed with pt and wife IS, sternal precautions, exercise at home, diet, and CRPII. Pt receptive, requests referral be sent to [facility]. Gave pt d/c video to view. Assisted pt to BR with RW. Pt needed reminders for sternal precautions.  [id number] Yves Dill BS, ACSM-CEP 01/28/2023 9:04 AM

## 2023-01-28 NOTE — Progress Notes (Signed)
CARDIOLOGY RECOMMENDATIONS:  Discharge is anticipated today. Recommendations for medications and follow up:  Discharge Medications: Continue medications as they are currently listed in the [facility].  Follow Up: The patient's Primary Cardiologist is Carlyle Dolly, MD  Follow up in the office in 2 weeks has been arranged  Signed,  Reino Bellis, NP  8:18 AM 01/28/2023  [place]

## 2023-02-03 ENCOUNTER — Ambulatory Visit: Payer: Self-pay | Admitting: *Deleted

## 2023-02-03 DIAGNOSIS — Z4802 Encounter for removal of sutures: Secondary | ICD-10-CM

## 2023-02-03 NOTE — Progress Notes (Signed)
Patient arrived for nurse visit to remove sutures post-CABG 2/9 by Dr. Cyndia Bent.  Three sutures removed with no signs or symptoms of infection noted.  Incisions well approximated.  Patient tolerated suture removal well.  Patient and family instructed to keep the incision site clean and dry. Patient and family acknowledged instructions given.  All questions answered.

## 2023-02-10 ENCOUNTER — Ambulatory Visit: Payer: No Typology Code available for payment source | Admitting: Medical

## 2023-02-19 ENCOUNTER — Other Ambulatory Visit: Payer: Self-pay | Admitting: Surgical

## 2023-02-24 ENCOUNTER — Other Ambulatory Visit: Payer: Self-pay | Admitting: Surgery

## 2023-02-24 DIAGNOSIS — Z951 Presence of aortocoronary bypass graft: Secondary | ICD-10-CM

## 2023-02-25 ENCOUNTER — Ambulatory Visit (INDEPENDENT_AMBULATORY_CARE_PROVIDER_SITE_OTHER): Payer: Self-pay | Admitting: Surgery

## 2023-02-25 ENCOUNTER — Ambulatory Visit
Admission: RE | Admit: 2023-02-25 | Discharge: 2023-02-25 | Disposition: A | Discharge status: Home / Self Care | Payer: Medicare Other | Source: Ambulatory Visit | Attending: Surgery | Admitting: Surgery

## 2023-02-25 ENCOUNTER — Encounter: Payer: Self-pay | Admitting: Surgery

## 2023-02-25 VITALS — BP 113/70 | HR 97 | Resp 18 | Ht 69.0 in | Wt 179.0 lb

## 2023-02-25 DIAGNOSIS — I700 Atherosclerosis of aorta: Secondary | ICD-10-CM | POA: Diagnosis not present

## 2023-02-25 DIAGNOSIS — Z951 Presence of aortocoronary bypass graft: Secondary | ICD-10-CM

## 2023-02-25 NOTE — Progress Notes (Signed)
HPI: Patient returns for routine postoperative follow-up having undergone CABG x 4 on 01/23/2023. The patient's early postoperative recovery while in the hospital was notable for an uncomplicated postoperative course. Since hospital discharge the patient reports that he has been feeling well.  He is walking daily without chest pain or shortness of breath.  He had to cancel his postop appointment with cardiology at [facility] and has not rescheduled that yet.   Current Outpatient Medications  Medication Sig Dispense Refill   acetaminophen (TYLENOL) 500 MG tablet Take 1,000 mg by mouth every 8 (eight) hours as needed for moderate pain.     aspirin EC 325 MG tablet Take 1 tablet (325 mg total) by mouth daily.     atorvastatin (LIPITOR) 80 MG tablet Take 1 tablet (80 mg total) by mouth at bedtime. 30 tablet 1   Cholecalciferol 50 MCG (2000 UT) TABS Take 1 tablet by mouth daily.     citalopram (CELEXA) 40 MG tablet Take 40 mg by mouth at bedtime.     colchicine 0.6 MG tablet Take 1 tablet (0.6 mg total) by mouth daily. 30 tablet 0   cyclobenzaprine (FLEXERIL) 10 MG tablet Take 0.5 tablets (5 mg total) by mouth at bedtime. 20 tablet 0   EPINEPHrine 0.3 mg/0.3 mL IJ SOAJ injection Inject 0.3 mg into the muscle daily as needed (allergic reaction).     gabapentin (NEURONTIN) 300 MG capsule Take 1 capsule (300 mg total) by mouth 3 (three) times daily.     glipiZIDE (GLUCOTROL) 10 MG tablet Take 1 tablet by mouth 2 (two) times daily.     JARDIANCE 25 MG TABS tablet Take 25 mg by mouth daily.     levothyroxine (SYNTHROID) 50 MCG tablet Take 50 mcg by mouth daily before breakfast.     metFORMIN (GLUCOPHAGE) 1000 MG tablet Take 1 tablet by mouth 2 (two) times daily.     metoprolol tartrate (LOPRESSOR) 25 MG tablet Take 1 tablet (25 mg total) by mouth 2 (two) times daily. 60 tablet 1   Multiple Vitamin (MULTIVITAMIN) tablet Take 1 tablet by mouth daily.     naloxone (NARCAN) nasal spray 4 mg/0.1 mL Place  into the nose.     Omega-3 Fatty Acids (FISH OIL) 1000 MG CAPS Take by mouth.     omeprazole (PRILOSEC) 20 MG capsule Take 20 mg by mouth daily with breakfast.      QUEtiapine (SEROQUEL) 200 MG tablet Take 1 tablet (200 mg total) by mouth at bedtime.     Semaglutide,0.25 or 0.'5MG'$ /DOS, (OZEMPIC, 0.25 OR 0.5 MG/DOSE,) 2 MG/1.5ML SOPN 0.5 mg once a week.     tamsulosin (FLOMAX) 0.4 MG CAPS capsule Take 0.4 mg by mouth daily.     vitamin B-12 (CYANOCOBALAMIN) 1000 MCG tablet Take 1,000 mcg by mouth daily.     No current facility-administered medications for this visit.    Physical Exam: BP 113/70 (BP Location: Left Arm, Patient Position: Sitting)   Pulse 97   Resp 18   Ht '5\' 9"'$  (1.753 m)   Wt 179 lb (81.2 kg)   SpO2 91% Comment: RA  BMI 26.43 kg/m  He looks well. Cardiac exam shows regular rate and rhythm with normal heart sounds. Lungs are clear. The chest incision is healing well and the sternum is stable. His leg incision is healing well and there is no peripheral edema.  Diagnostic Tests:  Narrative & Impression  CLINICAL DATA:  History of CABG 01/23/2023.   EXAM: CHEST -  2 VIEW   COMPARISON:  Chest radiographs 01/26/2023, 01/25/2023, 01/19/2023; CT chest 01/19/2023   FINDINGS: Status post median sternotomy and CABG. Interval removal of right internal jugular central venous catheter sheath. Cardiac silhouette again appears mildly enlarged. Moderate calcification within the aortic arch. Mild curvilinear likely scarring within the lateral right mid lung appears similar to 01/19/2023 CT. Mild curvilinear left costophrenic angle scarring is also similar to prior CT. No new focal airspace opacity. No pleural effusion or pneumothorax. Diffuse anterior multilevel thoracic bridging flowing ossifications along the anterior longitudinal ligament with relative preservation of the disc spaces compatible with diffuse idiopathic skeletal hyperostosis (dish). There is  mild-to-moderate anterior T12 height loss, unchanged from 01/19/2023 CT. Redemonstration of lower cervical spine apparent vertebral body prosthesis.   IMPRESSION: 1. No active cardiopulmonary disease. 2. Mild-to-moderate anterior T12 height loss, age indeterminate but new from 01/19/2023 CT. 3. Diffuse idiopathic skeletal hyperostosis (DISH).     Electronically Signed   By: Yvonne Kendall M.D.   On: 02/25/2023 11:04      Impression:  Overall he is doing very well 1 month following coronary bypass graft surgery.  I told him he could return to driving a car at this time but should refrain lifting anything heavier than 10 pounds for 3 months postoperatively.  I encouraged him to continue increasing his activity level.  He is not interested in cardiac rehab.  Plan:  He is going to reschedule an appointment with cardiology at [facility].  He will return to see me if he has any problems with his incisions.   Gaye Pollack, MD [facility] [phone]

## 2023-03-12 ENCOUNTER — Other Ambulatory Visit: Payer: Self-pay | Admitting: Surgical

## 2023-03-20 DIAGNOSIS — I10 Essential (primary) hypertension: Secondary | ICD-10-CM | POA: Diagnosis not present

## 2023-03-20 DIAGNOSIS — E785 Hyperlipidemia, unspecified: Secondary | ICD-10-CM | POA: Diagnosis not present

## 2023-03-20 DIAGNOSIS — E1165 Type 2 diabetes mellitus with hyperglycemia: Secondary | ICD-10-CM | POA: Diagnosis not present

## 2023-03-20 DIAGNOSIS — E039 Hypothyroidism, unspecified: Secondary | ICD-10-CM | POA: Diagnosis not present

## 2023-03-20 DIAGNOSIS — Z139 Encounter for screening, unspecified: Secondary | ICD-10-CM | POA: Diagnosis not present

## 2023-03-21 ENCOUNTER — Other Ambulatory Visit: Payer: Self-pay | Admitting: Surgical

## 2023-03-25 ENCOUNTER — Other Ambulatory Visit: Payer: Self-pay | Admitting: Surgical

## 2023-03-27 DIAGNOSIS — I10 Essential (primary) hypertension: Secondary | ICD-10-CM | POA: Diagnosis not present

## 2023-03-27 DIAGNOSIS — E785 Hyperlipidemia, unspecified: Secondary | ICD-10-CM | POA: Diagnosis not present

## 2023-03-27 DIAGNOSIS — Z87891 Personal history of nicotine dependence: Secondary | ICD-10-CM | POA: Diagnosis not present

## 2023-03-27 DIAGNOSIS — K219 Gastro-esophageal reflux disease without esophagitis: Secondary | ICD-10-CM | POA: Diagnosis not present

## 2023-03-27 DIAGNOSIS — R000 Tachycardia, unspecified: Secondary | ICD-10-CM | POA: Diagnosis not present

## 2023-03-27 DIAGNOSIS — Z79899 Other long term (current) drug therapy: Secondary | ICD-10-CM | POA: Diagnosis not present

## 2023-03-27 DIAGNOSIS — N400 Enlarged prostate without lower urinary tract symptoms: Secondary | ICD-10-CM | POA: Diagnosis not present

## 2023-03-27 DIAGNOSIS — Z85038 Personal history of other malignant neoplasm of large intestine: Secondary | ICD-10-CM | POA: Diagnosis not present

## 2023-03-27 DIAGNOSIS — E1165 Type 2 diabetes mellitus with hyperglycemia: Secondary | ICD-10-CM | POA: Diagnosis not present

## 2023-03-27 DIAGNOSIS — F331 Major depressive disorder, recurrent, moderate: Secondary | ICD-10-CM | POA: Diagnosis not present

## 2023-03-27 DIAGNOSIS — E1142 Type 2 diabetes mellitus with diabetic polyneuropathy: Secondary | ICD-10-CM | POA: Diagnosis not present

## 2023-03-27 DIAGNOSIS — E039 Hypothyroidism, unspecified: Secondary | ICD-10-CM | POA: Diagnosis not present

## 2023-04-09 ENCOUNTER — Encounter: Payer: Self-pay | Admitting: Physician Assistant

## 2023-04-09 NOTE — Progress Notes (Addendum)
Cardiology Office Note    Date:  04/13/2023   ID:  Matthew [name], DOB Dec 11, 1946, MRN [id number]  PCP:  [facility]  Cardiologist:  Dina Rich, MD  Electrophysiologist:  None   Chief Complaint: overdue post CABG f/u  History of Present Illness:   Matthew [name] is a 77 y.o. male with history of CAD s/p CABG 01/2023, small dissection flap in right carotid during that admission by CT, 1-39% BICA by duplex, 2.9cm infrarenal AAA by CT 01/2023, trivial pericardial effusion, aortic atherosclerosis, DM2, bipolar disorder, colon CA s/p prior surgery/chemo, COPD, arthritis, remote peptic ulcer, osteoporosis, HLD, PTSD, hypothyroidism who is seen for post-hospital evaluation. He cancelled his original post-hospital appointment and is here for overdue follow-up.   He was admitted with unstable angina with negative troponins. 2D echo 01/19/23 EF 65-70%, mild LVH, trivial pericardial effusion. Cath showed MVCAD. The day after cath he experienced some vision changes which he later reported had been present since 5 days prior. CT/MRI brain nonacute except for chronic ischemic changes, CTA chest without acute aortic pathology, CT angio head neck 2/8 reported "Small dissection flap in the superior aspect of the right carotid bulb with associated shallow pseudoaneurysm measuring 3 x 1 mm. No flow limitation or intraluminal thrombus" with otherwise nonobstructive cerebrovascular stenosis. Per IM notes, "CT angio head and neck showed calcified plaques but no hemodynamically significant stenosis." Do not see specific commentary regarding dissection. He underwent CABG on 01/23/23 with LIMA-LAD, SVG-diag, SVG-ramus, SVG-OM1.   He returns for follow-up doing great. He was on colchicine by CVTS for a period of time but when he went to refill he was informed he no longer needed to take this. He is not having any CP or SOB. He is about 60% back to normal but this is markedly improved compared to what he was  when he showed up to the hospital in 01/2023. He has not had any recent stroke symptoms.   Labwork independently reviewed: 03/20/23 [facility] DXA Hgb 13.6, Plt 175, K 4.6, Cr 0.96, LFTs wnl, LDL 48, trig 272, A1c 6.7, TSH wn 01/2023 K 4.4, Cr 0.90, Hgb 9.2, plt 127, Mg wnl, AST 51/ALT 71, albumin 3.0, LPa wnl, LDL 54, trig 89, TSH wnl, W0J 6.7, trops neg   Past History   Past Medical History:  Diagnosis Date   AAA (abdominal aortic aneurysm) (HCC)    Anxiety    Arthritis    Bipolar 1 disorder (HCC)    CAD (coronary artery disease)    Carotid artery disease (HCC)    Carotid artery dissection (HCC)    Chronic back pain    Colon cancer [facility]) oncologist-  dr Truett Perna   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--  s/p  right hemicolectomy 06-30-2014  and chemotherapy complete 01-10-2015   Complication of anesthesia    "I GET REAL COLD AND CAN'T URINATE"---  urinary retention   COPD with emphysema (HCC)    Cough    Diverticulosis of colon    GERD (gastroesophageal reflux disease)    History of adenomatous polyp of colon    History of bleeding peptic ulcer    2000   History of diverticulitis of colon    History of panic attacks    Hyperlipidemia    Osteoporosis    Pericardial effusion    PTSD (post-traumatic stress disorder)    [place]   Rectus diastasis    Wears dentures    upper    Past Surgical History:  Procedure Laterality Date   ANTERIOR CERVICAL DECOMP/DISCECTOMY FUSION  04-18-2003   C5 -- 6   APPENDECTOMY  yrs ago   BACK SURGERY  [phone]   lumbar lam and fusions   CIRCUMCISION  03-05-2005   COLONOSCOPY  [id number]   CORONARY ARTERY BYPASS GRAFT N/A 01/23/2023   Procedure: CORONARY ARTERY BYPASS GRAFTING TIMES FOUR USING LEFT INTERNAL MAMMARY ARTERY AND RIGHT GREATER SAPHENOUS VEIN;  Surgeon: Alleen Borne, MD;  Location: [facility] OR;  Service: Open Heart Surgery;  Laterality: N/A;   ENDOVEIN HARVEST OF GREATER SAPHENOUS VEIN Right 01/23/2023    Procedure: ENDOVEIN HARVEST OF RIGHT GREATER SAPHENOUS VEIN;  Surgeon: Alleen Borne, MD;  Location: [facility] OR;  Service: Open Heart Surgery;  Laterality: Right;   LAPAROSCOPIC PARTIAL COLECTOMY N/A 06/30/2014   Procedure: LAPAROSCOPIC right COLECTOMY;  Surgeon: Romie Levee, MD;  Location: [facility] ORS;  Service: General;  Laterality: N/A;   LEFT HEART CATH AND CORONARY ANGIOGRAPHY N/A 01/20/2023   Procedure: LEFT HEART CATH AND CORONARY ANGIOGRAPHY;  Surgeon: [place], Peter M, MD;  Location: [facility] INV[facility] CV LAB;  Service: Cardiovascular;  Laterality: N/A;   PORT-A-CATH REMOVAL Left 10/11/2015   Procedure: REMOVAL PORT-A-CATH;  Surgeon: Romie Levee, MD;  Location: [facility];  Service: General;  Laterality: Left;   PORTACATH PLACEMENT Left 07/31/2014   Procedure: INSERTION PORT-A-CATH;  Surgeon: Almond Lint, MD;  Location: [facility];  Service: General;  Laterality: Left;   TEE WITHOUT CARDIOVERSION N/A 01/23/2023   Procedure: TRANSESOPHAGEAL ECHOCARDIOGRAM;  Surgeon: Alleen Borne, MD;  Location: [facility] OR;  Service: Open Heart Surgery;  Laterality: N/A;   UPPER GI ENDOSCOPY  2015    Current Medications: Current Meds  Medication Sig   acetaminophen (TYLENOL) 500 MG tablet Take 1,000 mg by mouth every 8 (eight) hours as needed for moderate pain.   aspirin EC 325 MG tablet Take 1 tablet (325 mg total) by mouth daily.   atorvastatin (LIPITOR) 80 MG tablet Take 1 tablet (80 mg total) by mouth at bedtime.   Cholecalciferol 50 MCG (2000 UT) TABS Take 1 tablet by mouth daily.   citalopram (CELEXA) 40 MG tablet Take 40 mg by mouth at bedtime.   cyclobenzaprine (FLEXERIL) 10 MG tablet Take 0.5 tablets (5 mg total) by mouth at bedtime.   EPINEPHrine 0.3 mg/0.3 mL IJ SOAJ injection Inject 0.3 mg into the muscle daily as needed (allergic reaction).   gabapentin (NEURONTIN) 300 MG capsule Take 1 capsule (300 mg total) by mouth 3 (three) times daily.   glipiZIDE (GLUCOTROL) 10 MG tablet  Take 1 tablet by mouth 2 (two) times daily.   JARDIANCE 25 MG TABS tablet Take 12.5 mg by mouth daily.   levothyroxine (SYNTHROID) 50 MCG tablet Take 50 mcg by mouth daily before breakfast.   metFORMIN (GLUCOPHAGE) 1000 MG tablet Take 1 tablet by mouth 2 (two) times daily.   Multiple Vitamin (MULTIVITAMIN) tablet Take 1 tablet by mouth daily.   naloxone (NARCAN) nasal spray 4 mg/0.1 mL Place into the nose.   Omega-3 Fatty Acids (FISH OIL) 1000 MG CAPS Take by mouth.   omeprazole (PRILOSEC) 20 MG capsule Take 20 mg by mouth daily with breakfast.    QUEtiapine (SEROQUEL) 200 MG tablet Take 1 tablet (200 mg total) by mouth at bedtime.   Semaglutide,0.25 or 0.5MG /DOS, (OZEMPIC, 0.25 OR 0.5 MG/DOSE,) 2 MG/1.5ML SOPN 0.5 mg once a week.   tamsulosin (FLOMAX) 0.4 MG CAPS capsule Take 0.4 mg by mouth daily.  vitamin B-12 (CYANOCOBALAMIN) 1000 MCG tablet Take 1,000 mcg by mouth daily.      Allergies:   Bee venom, Ibuprofen, Naproxen, and Prednisone   Social History   Socioeconomic History   Marital status: Married    Spouse name: Aram Beecham   Number of children: 2   Years of education: Not on file   Highest education level: Not on file  Occupational History   Occupation: retired    Comment: trucking  Tobacco Use   Smoking status: Former    Packs/day: 1.00    Years: 30.00    Additional pack years: 0.00    Total pack years: 30.00    Types: Cigarettes    Quit date: 06/23/2004    Years since quitting: 18.8   Smokeless tobacco: Never  Vaping Use   Vaping Use: Never used  Substance and Sexual Activity   Alcohol use: No   Drug use: No   Sexual activity: Not on file  Other Topics Concern   Not on file  Social History Narrative   Married to wife, Aram Beecham for 45+ years   #2 grown children: Inetta Fermo and Hessie Diener   #3 grandchildren- youngest 10 years   Retired Retail buyer    Social Determinants of Corporate investment banker Strain: Not on file  Food Insecurity: No Food Insecurity  (01/19/2023)   Hunger Vital Sign    Worried About Running Out of Food in the Last Year: Never true    Ran Out of Food in the Last Year: Never true  Transportation Needs: No Transportation Needs (01/19/2023)   PRAPARE - Administrator, Civil Service (Medical): No    Lack of Transportation (Non-Medical): No  Physical Activity: Not on file  Stress: Not on file  Social Connections: Not on file     Family History:  The patient's family history includes Heart disease in his mother.  ROS:   Please see the history of present illness.  All other systems are reviewed and otherwise negative.    EKG(s)/Additional Testing   EKG:  EKG is ordered today, personally reviewed, demonstrating NSR 95bpm, possible prior inferior infarct, nonspecific STTW changes. No acute change from prior except QTC improved at .  CV Studies: Cardiac studies reviewed are outlined and summarized above. Otherwise please see EMR for full report.  Recent Labs: 01/19/2023: B Natriuretic Peptide 45.0; TSH 2.513 01/23/2023: ALT 71 01/25/2023: Magnesium 2.0 01/26/2023: BUN 26; Creatinine, Ser 0.90; Hemoglobin 9.2; Platelets 127; Potassium 4.4; Sodium 135  Recent Lipid Panel    Component Value Date/Time   CHOL 123 01/20/2023 0102   TRIG 89 01/20/2023 0102   HDL 51 01/20/2023 0102   CHOLHDL 2.4 01/20/2023 0102   VLDL 18 01/20/2023 0102   LDLCALC 54 01/20/2023 0102   LDLDIRECT 53.0 11/08/2019 1540    PHYSICAL EXAM:    VS:  BP 122/80   Pulse 95   Ht 5\' 9"  (1.753 m)   Wt 182 lb (82.6 kg)   SpO2 93%   BMI 26.88 kg/m   BMI: Body mass index is 26.88 kg/m.  GEN: Well nourished, well developed male in no acute distress HEENT: normocephalic, atraumatic Neck: no JVD, carotid bruits, or masses Cardiac: RRR; no murmurs, rubs, or gallops, no edema  Respiratory:  clear to auscultation bilaterally, normal work of breathing GI: soft, nontender, nondistended, + BS MS: no deformity or atrophy Skin: warm and dry,  no rash, right radial cath site without hematoma or ecchymosis; good pulse Neuro:  Alert and  Oriented x 3, Strength and sensation are intact, follows commands Psych: euthymic mood, full affect  Wt Readings from Last 3 Encounters:  04/13/23 182 lb (82.6 kg)  02/25/23 179 lb (81.2 kg)  01/28/23 168 lb 1.6 oz (76.2 kg)     ASSESSMENT & PLAN:   1. CAD, HLD - doing well post CABG. Does not plan to participate in CR because he has a lot of acreage and feels he accomplishes enough activity doing that. He clarifies he is off colchicine, removed from medicine list. I will reach out to Dr. Laneta Simmers to inquire how long he feels the patient needs to be on higher dose ASA instead of baby ASA - remains on 325mg  since discharge. Continue atorvastatin. Continue metoprolol. HR appears to be at baseline at upper limits of normal going back to 2015 but actually improved from recent values so will continue present dose. Triglycerides were up by recent PCP labs - patient reports was going to try lifestyle changes first before adding new meds. If still up on recheck would consider Vascepa.  2. Carotid artery dissection, carotid artery disease - discussed with Dr. Wyline Mood. Do not see specifically commented on in the hospital. Dr. Wyline Mood felt this may not be clinically significant since small and nonobstructive to flow but recommended reaching out to one of the vascular physicians to see if something like this requires monitoring over time. Message sent to Dr. Arbie Cookey. Will also place formal office referral to follow this finding and AAA.  3. AAA - 2.9cm by CT 01/2023, recommended for 5 year f/u. Discussed aneurysm guidelines with patient. We are referring to vascular surgery as above for #2. Continue to re-eval plan for f/u monitoring at each visit.  4. Trivial pericardial effusion - of no specific consequence at this time, no repeat testing indicated at this time.    Disposition: F/u with Dr. Wyline Mood in 6  months.   Medication Adjustments/Labs and Tests Ordered: Current medicines are reviewed at length with the patient today.  Concerns regarding medicines are outlined above. Medication changes, Labs and Tests ordered today are summarized above and listed in the Patient Instructions accessible in Encounters.    Signed, Laurann Montana, PA-C  04/13/2023 1:47 PM    [facility] [place] Location in [facility] [address] [place], [place] [address] Ph: [phone]; Fax [phone]

## 2023-04-13 ENCOUNTER — Ambulatory Visit: Payer: Medicare Other | Attending: Medical | Admitting: Physician Assistant

## 2023-04-13 ENCOUNTER — Encounter: Payer: Self-pay | Admitting: Physician Assistant

## 2023-04-13 VITALS — BP 122/80 | HR 95 | Ht 69.0 in | Wt 182.0 lb

## 2023-04-13 DIAGNOSIS — I6523 Occlusion and stenosis of bilateral carotid arteries: Secondary | ICD-10-CM | POA: Insufficient documentation

## 2023-04-13 DIAGNOSIS — I7771 Dissection of carotid artery: Secondary | ICD-10-CM

## 2023-04-13 DIAGNOSIS — E785 Hyperlipidemia, unspecified: Secondary | ICD-10-CM | POA: Diagnosis not present

## 2023-04-13 DIAGNOSIS — I3139 Other pericardial effusion (noninflammatory): Secondary | ICD-10-CM

## 2023-04-13 DIAGNOSIS — I2510 Atherosclerotic heart disease of native coronary artery without angina pectoris: Secondary | ICD-10-CM | POA: Insufficient documentation

## 2023-04-13 DIAGNOSIS — I7143 Infrarenal abdominal aortic aneurysm, without rupture: Secondary | ICD-10-CM | POA: Insufficient documentation

## 2023-04-13 NOTE — Patient Instructions (Addendum)
Information About Your Aneurysm  One of your tests has shown an aneurysm of your abdominal aorta. The word "aneurysm" refers to a bulge in an artery (blood vessel). Most people think of them in the context of an emergency, but yours was found incidentally. At this point there is nothing you need to do from a procedure standpoint, but there are some important things to keep in mind for day-to-day life.  Mainstays of therapy for aneurysms include very good blood pressure control, healthy lifestyle, and avoiding tobacco products and street drugs. Research has raised concern that antibiotics in the fluoroquinolone class could be associated with increased risk of having an aneurysm develop or tear. This includes medicines that end in "floxacin," like Cipro or Levaquin. Make sure to discuss this information with other healthcare providers if you require antibiotics.  Since aneurysms can run in families, you should discuss your diagnosis with first degree relatives as they may need to be screened for this. Regular mild-moderate physical exercise is important, but avoid heavy lifting/weight lifting over 30lbs, chopping wood, shoveling snow or digging heavy earth with a shovel. It is best to avoid activities that cause grunting or straining (medically referred to as a "Valsalva maneuver"). This happens when a person bears down against a closed throat to increase the strength of arm or abdominal muscles. There's often a tendency to do this when lifting heavy weights, doing sit-ups, push-ups or chin-ups, etc., but it may be harmful.  This is a finding I would expect to be monitored periodically by your cardiology team. Most unruptured thoracic aortic aneurysms cause no symptoms, so they are often found during exams for other conditions. Contact a health care provider if you develop any discomfort in your upper back, neck, abdomen, trouble swallowing, cough or hoarseness, or unexplained weight loss. Get help right away  if you develop severe pain in your upper back or abdomen that may move into your chest and arms, or any other concerning symptoms such as shortness of breath or fever.  Medication Instructions:  Your physician recommends that you continue on your current medications as directed. Please refer to the Current Medication list given to you today.  *If you need a refill on your cardiac medications before your next appointment, please call your pharmacy*   Lab Work: NONE   If you have labs (blood work) drawn today and your tests are completely normal, you will receive your results only by: MyChart Message (if you have MyChart) OR A paper copy in the mail If you have any lab test that is abnormal or we need to change your treatment, we will call you to review the results.   Testing/Procedures: NONE    Follow-Up: At [facility], you and your health needs are our priority.  As part of our continuing mission to provide you with exceptional heart care, we have created designated Provider Care Teams.  These Care Teams include your primary Cardiologist (physician) and Advanced Practice Providers (APPs -  Physician Assistants and Nurse Practitioners) who all work together to provide you with the care you need, when you need it.  We recommend signing up for the patient portal called "MyChart".  Sign up information is provided on this After Visit Summary.  MyChart is used to connect with patients for Virtual Visits (Telemedicine).  Patients are able to view lab/test results, encounter notes, upcoming appointments, etc.  Non-urgent messages can be sent to your provider as well.   To learn more about what you can  do with MyChart, go to [email or web address].    Your next appointment:   6 month(s)  Provider:   Dina Rich, MD    Other Instructions Thank you for choosing [facility]!

## 2023-04-15 ENCOUNTER — Encounter: Payer: Self-pay | Admitting: Vascular Surgery

## 2023-04-15 ENCOUNTER — Ambulatory Visit (INDEPENDENT_AMBULATORY_CARE_PROVIDER_SITE_OTHER): Payer: Medicare Other | Admitting: Vascular Surgery

## 2023-04-15 VITALS — BP 124/76 | HR 108 | Temp 97.2°F | Ht 69.0 in | Wt 183.0 lb

## 2023-04-15 DIAGNOSIS — I7143 Infrarenal abdominal aortic aneurysm, without rupture: Secondary | ICD-10-CM

## 2023-04-15 DIAGNOSIS — I7771 Dissection of carotid artery: Secondary | ICD-10-CM

## 2023-04-15 NOTE — Progress Notes (Signed)
[facility] [place]  Patient name: Matthew [name] MRN: [id number] DOB: [birth year] Sex: male  REASON FOR CONSULT: Discuss carotid dissection, right and infrarenal abdominal aortic aneurysm  HPI: Matthew [name] is a 77 y.o. male, who is here today with his wife.  He was hospitalized with acute coronary syndrome and eventually underwent coronary artery bypass grafting on 01/23/2023 at [facility].  Prior to surgery he had an episode of aphasia and during his workup also had CT angiogram of his chest abdomen and pelvis.  CT scan of his head and MRI of his head showed no evidence of acute stroke.  He did have irregularity in his right carotid bifurcation and the radiologist or potation's of the small dissection with pseudoaneurysm.  He also was found to have a 2.9 cm infrarenal abdominal aortic aneurysm.  Denies any focal neurologic deficits.  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--  s/p  right hemicolectomy 06-30-2014  and chemotherapy complete 01-10-2015   Complication of anesthesia    "I GET REAL COLD AND CAN'T URINATE"---  urinary retention   COPD with emphysema (HCC)    Cough    Diverticulosis of colon    GERD (gastroesophageal reflux disease)    History of adenomatous polyp of colon    History of bleeding peptic ulcer    2000   History of diverticulitis of colon    History of panic attacks    Hyperlipidemia    Osteoporosis    Pericardial effusion    PTSD (post-traumatic stress disorder)    [place]   Rectus diastasis    Wears dentures    upper    Family  History  Problem Relation Age of Onset   Heart disease Mother     SOCIAL HISTORY: Social History   Socioeconomic History   Marital status: Married    Spouse name: Matthew [name]   Number of children: 2   Years of education: Not on file   Highest education level: Not on file  Occupational History   Occupation: retired    Comment: trucking  Tobacco Use   Smoking status: Former    Packs/day: 1.00    Years: 30.00    Additional pack years: 0.00    Total pack years: 30.00    Types: Cigarettes    Quit date: 06/23/2004    Years since quitting: 18.8   Smokeless tobacco: Never  Vaping Use   Vaping Use: Never used  Substance and Sexual Activity   Alcohol use: No   Drug use: No   Sexual activity: Not on file  Other Topics Concern   Not on file  Social History Narrative   Married to wife, Matthew [name] for 45+ years   #2 grown children: Inetta Fermo and Hessie Diener   #3 grandchildren- youngest 10 years   Retired Retail buyer    Social Determinants of Corporate investment banker Strain: Not on file  Food Insecurity: No Food Insecurity (01/19/2023)   Hunger Vital Sign    Worried About Running Out of Food in the Last  Year: Never true    Ran Out of Food in the Last Year: Never true  Transportation Needs: No Transportation Needs (01/19/2023)   PRAPARE - Administrator, Civil Service (Medical): No    Lack of Transportation (Non-Medical): No  Physical Activity: Not on file  Stress: Not on file  Social Connections: Not on file  Intimate Partner Violence: Not At Risk (01/19/2023)   Humiliation, Afraid, Rape, and Kick questionnaire    Fear of Current or Ex-Partner: No    Emotionally Abused: No    Physically Abused: No    Sexually Abused: No    Allergies  Allergen Reactions   Bee Venom Anaphylaxis   Ibuprofen Shortness Of Breath and Swelling    Spoke with patient 01/19/23 he assures me no allergy to aspirin, only other NSAIDs   Naproxen Anaphylaxis and Swelling   Nsaids Anaphylaxis and Swelling    Prednisone     Current Outpatient Medications  Medication Sig Dispense Refill   acetaminophen (TYLENOL) 500 MG tablet Take 1,000 mg by mouth every 8 (eight) hours as needed for moderate pain.     aspirin EC 325 MG tablet Take 1 tablet (325 mg total) by mouth daily.     atorvastatin (LIPITOR) 80 MG tablet Take 1 tablet (80 mg total) by mouth at bedtime. 30 tablet 1   Cholecalciferol 50 MCG (2000 UT) TABS Take 1 tablet by mouth daily.     citalopram (CELEXA) 40 MG tablet Take 40 mg by mouth at bedtime.     cyclobenzaprine (FLEXERIL) 10 MG tablet Take 0.5 tablets (5 mg total) by mouth at bedtime. 20 tablet 0   EPINEPHrine 0.3 mg/0.3 mL IJ SOAJ injection Inject 0.3 mg into the muscle daily as needed (allergic reaction).     gabapentin (NEURONTIN) 300 MG capsule Take 1 capsule (300 mg total) by mouth 3 (three) times daily.     glipiZIDE (GLUCOTROL) 10 MG tablet Take 1 tablet by mouth 2 (two) times daily.     JARDIANCE 25 MG TABS tablet Take 12.5 mg by mouth daily.     levothyroxine (SYNTHROID) 50 MCG tablet Take 50 mcg by mouth daily before breakfast.     metFORMIN (GLUCOPHAGE) 1000 MG tablet Take 1 tablet by mouth 2 (two) times daily.     Multiple Vitamin (MULTIVITAMIN) tablet Take 1 tablet by mouth daily.     naloxone (NARCAN) nasal spray 4 mg/0.1 mL Place into the nose.     Omega-3 Fatty Acids (FISH OIL) 1000 MG CAPS Take by mouth.     omeprazole (PRILOSEC) 20 MG capsule Take 20 mg by mouth daily with breakfast.      oxyCODONE (OXY IR/ROXICODONE) 5 MG immediate release tablet Take 5 mg by mouth every 6 (six) hours as needed.     QUEtiapine (SEROQUEL) 200 MG tablet Take 1 tablet (200 mg total) by mouth at bedtime.     Semaglutide, 1 MG/DOSE, 4 MG/3ML SOPN INJECT 1MG  (0.75ML) UNDER THE SKIN ONCE A WEEK     sildenafil (VIAGRA) 100 MG tablet Take 100 mg by mouth as needed.     tamsulosin (FLOMAX) 0.4 MG CAPS capsule Take 0.4 mg by mouth daily.     vitamin B-12 (CYANOCOBALAMIN) 1000 MCG tablet  Take 1,000 mcg by mouth daily.     No current facility-administered medications for this visit.    REVIEW OF SYSTEMS:  [X]  denotes positive finding, [ ]  denotes negative finding Cardiac  Comments:  Chest pain or chest pressure:  Shortness of breath upon exertion:    Short of breath when lying flat:    Irregular heart rhythm:        Vascular    Pain in calf, thigh, or hip brought on by ambulation:    Pain in feet at night that wakes you up from your sleep:     Blood clot in your veins:    Leg swelling:         Pulmonary    Oxygen at home:    Productive cough:     Wheezing:         Neurologic    Sudden weakness in arms or legs:     Sudden numbness in arms or legs:     Sudden onset of difficulty speaking or slurred speech:    Temporary loss of vision in one eye:     Problems with dizziness:         Gastrointestinal    Blood in stool:     Vomited blood:         Genitourinary    Burning when urinating:     Blood in urine:        Psychiatric    Major depression:         Hematologic    Bleeding problems:    Problems with blood clotting too easily:        Skin    Rashes or ulcers:        Constitutional    Fever or chills:      PHYSICAL EXAM: Vitals:   04/15/23 1100 04/15/23 1102  BP: 114/76 124/76  Pulse: (!) 108   Temp: (!) 97.2 F (36.2 C)   SpO2: 96%   Weight: 183 lb (83 kg)   Height: 5\' 9"  (1.753 m)     GENERAL: The patient is a well-nourished male, in no acute distress. The vital signs are documented above. CARDIOVASCULAR: Carotid arteries without bruits bilaterally.  2+ radial pulses bilaterally.  Abdomen benign. PULMONARY: There is good air exchange  MUSCULOSKELETAL: There are no major deformities or cyanosis. NEUROLOGIC: No focal weakness or paresthesias are detected. SKIN: There are no ulcers or rashes noted. PSYCHIATRIC: The patient has a normal affect.  DATA:  Reviewed his CT angio of his neck.  This does show a very subtle irregularity  in his right carotid is very mild outpouching.  There is no evidence of any flow limitation  CT angio of his chest abdomen pelvis was also reviewed.  This does show a 2.9 cm dilatation of his infrarenal aorta.  In comparing this to a prior CT scan in 2017, maximal diameter at that time was 2.7 cm.  MEDICAL ISSUES: Had long discussion with the patient and his wife regarding these findings.  I feel that this puts him at minimal risk regarding his carotid disease.  Explained this is an incidental finding.  I would recommend that we see him in 1 year with duplex and have coordinated this.  Regarding his infrarenal aorta, I explained that he just barely has a 1.5% increase in size of his infrarenal aorta compared to above.  By definition this is a extremely small aneurysm.  I would recommend ultrasound in 2 to 3 years to rule out any progression.  He has documented that he is only increased 2 mm and 7 years since his 2017 study.  He was reassured with this discussion.  We will see him in 1 year with carotid duplex Larina Earthly, MD FACS  [facility] [place] Office Tel [phone] Pager [phone]  Note: Portions of this report may have been transcribed using voice recognition software.  Every effort has been made to ensure accuracy; however, inadvertent computerized transcription errors may still be present.

## 2023-04-25 ENCOUNTER — Telehealth: Payer: Self-pay | Admitting: Physician Assistant

## 2023-04-25 NOTE — Telephone Encounter (Signed)
Follow-up to recent OV: I reached out to Dr. Laneta Simmers to inquire how long he prefers his patients be on ASA 325mg  daily. Please let pt know that per our discussion he recommends OK to decrease to 81mg  daily post CABG - since surgery was on 01/23/23, should be fine to decrease now. Thank you!

## 2023-04-27 MED ORDER — ASPIRIN 81 MG PO TBEC: 81.0000 mg | DELAYED_RELEASE_TABLET | Freq: Every day | ORAL | 3 refills | Status: AC

## 2023-04-27 NOTE — Telephone Encounter (Signed)
I spoke with patient and relayed D.Dunn's message. He will decrease ASA to 81 mg qd.

## 2023-05-15 DIAGNOSIS — H40053 Ocular hypertension, bilateral: Secondary | ICD-10-CM | POA: Diagnosis not present

## 2023-05-20 ENCOUNTER — Telehealth: Payer: Self-pay | Admitting: Physician Assistant

## 2023-05-20 NOTE — Telephone Encounter (Signed)
  Pt c/o medication issue:  1. Name of Medication: metoprolol tartrate 25mg    JARDIANCE 25 MG TABS tablet    2. How are you currently taking this medication (dosage and times per day)?   3. Are you having a reaction (difficulty breathing--STAT)? No   4. What is your medication issue?  Pt's wife called, she said, she is trying to get metoprolol refills but its not on pt's medication list. Also, she would like to know what is the right dosage for the pt's Jardiance

## 2023-05-20 NOTE — Telephone Encounter (Signed)
I dont see a note where it was stopped. BP and HR look fine, confirm with patient no side effects and if not can restart lopressor 25mg  bid  Dominga Ferry MD

## 2023-05-20 NOTE — Telephone Encounter (Signed)
Left a message for patient/ pt's wife to call office back

## 2023-05-20 NOTE — Telephone Encounter (Signed)
Patients Metoprolol was d/c'd in 01/2023 by Gershon Crane, PA-C ( I believe w/ [facility]). Please advise if pt is supposed to continue this medication.

## 2023-05-21 MED ORDER — METOPROLOL TARTRATE 25 MG PO TABS: 25.0000 mg | ORAL_TABLET | Freq: Two times a day (BID) | ORAL | 3 refills | Status: DC

## 2023-05-21 NOTE — Telephone Encounter (Signed)
Spoke to pt's wife who verbalized understanding. Patient had no other questions or concerns at this time.

## 2023-08-05 NOTE — Progress Notes (Unsigned)
Cardiology Office Note    Date:  08/06/2023  ID:  Matthew [name], DOB [birth year], MRN [id number] PCP:  [facility]  Cardiologist:  Dina Rich, MD  Electrophysiologist:  None   Chief Complaint: f/u CAD  History of Present Illness: .    Matthew [name] is a 77 y.o. male with visit-pertinent history of CAD s/p CABG 01/2023, small dissection flap in right carotid during that admission by CT, 1-39% BICA by duplex, 2.9cm infrarenal AAA by CT 01/2023, trivial pericardial effusion, aortic atherosclerosis, DM2, bipolar disorder, colon CA s/p prior surgery/chemo, COPD, arthritis, remote peptic ulcer, osteoporosis, HLD, PTSD, hypothyroidism who is seen for f/u.   He was admitted 01/2023 with unstable angina with negative troponins. 2D echo 01/19/23 EF 65-70%, mild LVH, trivial pericardial effusion (the latter not felt clinically significant). Cath showed MVCAD. The day after cath he experienced some vision changes which he later reported had been present since 5 days prior. CT/MRI brain nonacute except for chronic ischemic changes, CTA chest without acute aortic pathology, CT angio head neck 2/8 reported "Small dissection flap in the superior aspect of the right carotid bulb with associated shallow pseudoaneurysm measuring 3 x 1 mm. No flow limitation or intraluminal thrombus" with otherwise nonobstructive cerebrovascular stenosis. Per IM notes, "CT angio head and neck showed calcified plaques but no hemodynamically significant stenosis." He underwent CABG on 01/23/23 with LIMA-LAD, SVG-diag, SVG-ramus, SVG-OM1. He was doing well in follow-up. He was seen by Dr. Arbie Cookey in follow-up regarding the carotid irregularity who felt this was incidental finding. He recommended carotid US + vascular f/u in 1 year (04/2024) and repeat abdominal duplex in 2-3 years to follow AAA.  He is seen for follow-up today doing well. His stamina is still not quite it was in prior years but improving over the last few  months. He reports he is not taking metoprolol as he remotely had issues with low BP on this. This has not been an issue in recent years. They report they took it after DC from the hospital but never finished out the bottle. He is also noted to be on lisinopril which he reports he's been on for a long time, not listed on previous med list. No recent angina or dyspnea.    Labwork independently reviewed: 03/20/23 [facility] DXA Hgb 13.6, Plt 175, K 4.6, Cr 0.96, LFTs wnl, LDL 48, trig 272, A1c 6.7, TSH wn 01/2023 K 4.4, Cr 0.90, Hgb 9.2, plt 127, Mg wnl, AST 51/ALT 71, albumin 3.0, LPa wnl, LDL 54, trig 89, TSH wnl, W0J 6.7, trops neg  ROS: .    Please see the history of present illness.  All other systems are reviewed and otherwise negative.  Studies Reviewed: Marland Kitchen    EKG:  EKG is not ordered todaym reviewed from prior OV  CV Studies: Cardiac studies reviewed are outlined and summarized above. Otherwise please see EMR for full report.   Current Reported Medications:.    Current Meds  Medication Sig   acetaminophen (TYLENOL) 500 MG tablet Take 1,000 mg by mouth every 8 (eight) hours as needed for moderate pain.   aspirin EC 81 MG tablet Take 1 tablet (81 mg total) by mouth daily. Swallow whole.   atorvastatin (LIPITOR) 80 MG tablet Take 1 tablet (80 mg total) by mouth at bedtime.   Cholecalciferol 50 MCG (2000 UT) TABS Take 1 tablet by mouth daily.   citalopram (CELEXA) 40 MG tablet Take 40 mg by mouth at bedtime.   cyclobenzaprine (  FLEXERIL) 10 MG tablet Take 0.5 tablets (5 mg total) by mouth at bedtime.   EPINEPHrine 0.3 mg/0.3 mL IJ SOAJ injection Inject 0.3 mg into the muscle daily as needed (allergic reaction).   gabapentin (NEURONTIN) 300 MG capsule Take 1 capsule (300 mg total) by mouth 3 (three) times daily.   glipiZIDE (GLUCOTROL) 10 MG tablet Take 1 tablet by mouth 2 (two) times daily.   JARDIANCE 25 MG TABS tablet Take 12.5 mg by mouth daily.   levothyroxine (SYNTHROID) 50 MCG tablet  Take 50 mcg by mouth daily before breakfast.   lisinopril (ZESTRIL) 5 MG tablet Take 5 mg by mouth daily.   metFORMIN (GLUCOPHAGE) 1000 MG tablet Take 1 tablet by mouth 2 (two) times daily.   Multiple Vitamin (MULTIVITAMIN) tablet Take 1 tablet by mouth daily.   naloxone (NARCAN) nasal spray 4 mg/0.1 mL Place into the nose.   Omega-3 Fatty Acids (FISH OIL) 1000 MG CAPS Take by mouth.   omeprazole (PRILOSEC) 20 MG capsule Take 20 mg by mouth daily with breakfast.    oxyCODONE (OXY IR/ROXICODONE) 5 MG immediate release tablet Take 5 mg by mouth every 6 (six) hours as needed.   QUEtiapine (SEROQUEL) 200 MG tablet Take 1 tablet (200 mg total) by mouth at bedtime.   Semaglutide, 1 MG/DOSE, 4 MG/3ML SOPN INJECT 1MG  (0.75ML) UNDER THE SKIN ONCE A WEEK   sildenafil (VIAGRA) 100 MG tablet Take 100 mg by mouth as needed.   tamsulosin (FLOMAX) 0.4 MG CAPS capsule Take 0.4 mg by mouth daily.   vitamin B-12 (CYANOCOBALAMIN) 1000 MCG tablet Take 1,000 mcg by mouth daily.    Physical Exam:    VS:  BP 124/70   Pulse 99   Ht 5\' 9"  (1.753 m)   Wt 182 lb (82.6 kg)   SpO2 94%   BMI 26.88 kg/m    Wt Readings from Last 3 Encounters:  08/06/23 182 lb (82.6 kg)  04/15/23 183 lb (83 kg)  04/13/23 182 lb (82.6 kg)    GEN: Well nourished, well developed in no acute distress NECK: No JVD; No carotid bruits CARDIAC: RRR, no murmurs, rubs, gallops RESPIRATORY:  Clear to auscultation without rales, wheezing or rhonchi  ABDOMEN: Soft, non-tender, non-distended EXTREMITIES:  No edema; No acute deformity   Asessement and Plan:.    1. CAD - doing well without recent angina. Continue ASA 81mg  (clarified dose with CVTS last OV). Given his chronic borderline sinus tach, we'll restart metoprolol in the lower consolidated dose form of succinate 25mg  daily. He will call with BP/HR readings in 1 week. See below regarding statin. Also provided rx for SL NTG PRN with guidance to avoid sildenafil within 48 hours of dosing  and vice versa. He was not previously interested in cardiac rehab.  2. HLD - trigs up by labs 03/2023. Will have him return for fasting CMET/lipid profile at his convenience. If triglycerides still suboptimal, consider changing OTC fish oil to Vascepa. Getting CMET instead of LFTs as he also is back on lisinopril though may have actually been on this at time of f/u OV as well.  3. Borderline sinus tachycardia - chronic issue going back many years. Previous HR 2015-2016 was even low 100s. He remains in the 90s today. Will follow with re-addition of metoprolol above. TSH this year was normal and last Hgb 03/2023 ([facility] DXA) had normalized.  4. Carotid artery disease, AAA - continue ASA, statin. F/u VVS as suggested.     Disposition: F/u with Dr.  Branch or myself in 6 months.  Signed, Laurann Montana, PA-C

## 2023-08-06 ENCOUNTER — Ambulatory Visit: Payer: Medicare Other | Attending: Physician Assistant | Admitting: Physician Assistant

## 2023-08-06 ENCOUNTER — Encounter: Payer: Self-pay | Admitting: Physician Assistant

## 2023-08-06 VITALS — BP 124/70 | HR 99 | Ht 69.0 in | Wt 182.0 lb

## 2023-08-06 DIAGNOSIS — E785 Hyperlipidemia, unspecified: Secondary | ICD-10-CM | POA: Insufficient documentation

## 2023-08-06 DIAGNOSIS — I2510 Atherosclerotic heart disease of native coronary artery without angina pectoris: Secondary | ICD-10-CM | POA: Insufficient documentation

## 2023-08-06 DIAGNOSIS — I779 Disorder of arteries and arterioles, unspecified: Secondary | ICD-10-CM | POA: Insufficient documentation

## 2023-08-06 DIAGNOSIS — I7143 Infrarenal abdominal aortic aneurysm, without rupture: Secondary | ICD-10-CM | POA: Diagnosis not present

## 2023-08-06 MED ORDER — METOPROLOL SUCCINATE ER 25 MG PO TB24: 25.0000 mg | ORAL_TABLET | Freq: Every day | ORAL | 3 refills | Status: AC

## 2023-08-06 MED ORDER — NITROGLYCERIN 0.4 MG SL SUBL: 0.4000 mg | SUBLINGUAL_TABLET | SUBLINGUAL | 3 refills | Status: AC | PRN

## 2023-08-06 NOTE — Patient Instructions (Addendum)
Do not take nitroglycerin if you have taken sildenafil in the last 48 hours. The opposite is true as well. Do not take sildenafil if you have taken nitroglycerin in the last 48 hours. If you take these two medicines, they can cause low blood pressure if taken close together.   Medication Instructions:   Take Nito 0.4mg  every 5 minutes times 3 for chest pain   Call in 1 week with Blood pressure and heart rate readings.   Stop taking Lopressor   Start Taking Toprol XL 25 mg Daily   *If you need a refill on your cardiac medications before your next appointment, please call your pharmacy*   Lab Work: Your physician recommends that you return for lab work. Fasting ( CMET, Lipids)   If you have labs (blood work) drawn today and your tests are completely normal, you will receive your results only by: MyChart Message (if you have MyChart) OR A paper copy in the mail If you have any lab test that is abnormal or we need to change your treatment, we will call you to review the results.   Testing/Procedures: NONE   Follow-Up: At [facility], you and your health needs are our priority.  As part of our continuing mission to provide you with exceptional heart care, we have created designated Provider Care Teams.  These Care Teams include your primary Cardiologist (physician) and Advanced Practice Providers (APPs -  Physician Assistants and Nurse Practitioners) who all work together to provide you with the care you need, when you need it.  We recommend signing up for the patient portal called "MyChart".  Sign up information is provided on this After Visit Summary.  MyChart is used to connect with patients for Virtual Visits (Telemedicine).  Patients are able to view lab/test results, encounter notes, upcoming appointments, etc.  Non-urgent messages can be sent to your provider as well.   To learn more about what you can do with MyChart, go to [email or web address].    Your next  appointment:   6 month(s)  Provider:   You may see Dina Rich, MD or one of the following Advanced Practice Providers on your designated Care Team:   Randall An, PA-C  Jacolyn Reedy, [place]     Other Instructions Thank you for choosing [facility]!

## 2023-08-07 ENCOUNTER — Other Ambulatory Visit (HOSPITAL_COMMUNITY)
Admission: RE | Admit: 2023-08-07 | Discharge: 2023-08-07 | Disposition: A | Discharge status: Home / Self Care | Payer: Medicare Other | Source: Ambulatory Visit | Attending: Physician Assistant | Admitting: Physician Assistant

## 2023-08-07 ENCOUNTER — Telehealth: Payer: Self-pay

## 2023-08-07 DIAGNOSIS — I2510 Atherosclerotic heart disease of native coronary artery without angina pectoris: Secondary | ICD-10-CM | POA: Insufficient documentation

## 2023-08-07 DIAGNOSIS — E119 Type 2 diabetes mellitus without complications: Secondary | ICD-10-CM

## 2023-08-07 DIAGNOSIS — E785 Hyperlipidemia, unspecified: Secondary | ICD-10-CM | POA: Diagnosis not present

## 2023-08-07 LAB — COMPREHENSIVE METABOLIC PANEL
ALT: 48 U/L — ABNORMAL HIGH (ref 0–44)
AST: 40 U/L (ref 15–41)
Albumin: 3.8 g/dL (ref 3.5–5.0)
Alkaline Phosphatase: 67 U/L (ref 38–126)
Anion gap: 14 (ref 5–15)
BUN: 26 mg/dL — ABNORMAL HIGH (ref 8–23)
CO2: 19 mmol/L — ABNORMAL LOW (ref 22–32)
Calcium: 8.7 mg/dL — ABNORMAL LOW (ref 8.9–10.3)
Chloride: 101 mmol/L (ref 98–111)
Creatinine, Ser: 1.16 mg/dL (ref 0.61–1.24)
GFR, Estimated: >60 mL/min (ref >60)
Glucose, Bld: 243 mg/dL — ABNORMAL HIGH (ref 70–99)
Potassium: 4.7 mmol/L (ref 3.5–5.1)
Sodium: 134 mmol/L — ABNORMAL LOW (ref 135–145)
Total Bilirubin: 1 mg/dL (ref 0.3–1.2)
Total Protein: 7.2 g/dL (ref 6.5–8.1)

## 2023-08-07 LAB — LIPID PANEL
Cholesterol: 113 mg/dL (ref 0–200)
HDL: 34 mg/dL — ABNORMAL LOW (ref >40)
LDL Cholesterol: 8 mg/dL (ref 0–99)
Total CHOL/HDL Ratio: 3.3 RATIO
Triglycerides: 357 mg/dL — ABNORMAL HIGH (ref <150)
VLDL: 71 mg/dL — ABNORMAL HIGH (ref 0–40)

## 2023-08-07 NOTE — Telephone Encounter (Signed)
Patient notified and verbalized understanding. Patient had no questions or concerns at this time. Pt stated that he was fasting at the time of lab draw. Pt will have repeat labs re-drawn next week per providers request.

## 2023-08-07 NOTE — Telephone Encounter (Signed)
-----   Message from Laurann Montana sent at 08/07/2023 11:03 AM EDT ----- Please let pt know that labs show increase in the type of cholesterol called triglycerides as well as higher blood sugar and slight decline in kidney function. His LDL is quite low at 8 but might be falsely low due to the high triglycerides. Kidney function has declined slightly from values earlier this year but at visit yesterday he reported he was back on chronic lisinopril and kidney function is actually similar to older labs so this may be his true baseline (Cr 1-1.3 between 2015 and 2022). However, BUN is up a little so might be on the dry side. One liver function number is abnormal but again stable compared to prior values.  Bottom line = minor abnormalities but cholesterol/kidney needs a little further follow-up to help guide stability and plan. He should make sure to be hydrating well with water, about 64oz per day. Can you verify whether or not he had eaten before these labs were drawn?  - If he had accidentally eaten, would have him return for fasting lipid panel PLUS direct LDL, BMET and A1c at his convenience next week.  - if he had fasted before labs were draw, would have him return for fasting direct LDL (full lipid panel not needed), BMET, and A1C at his convenience next week  Thank you!

## 2023-11-10 ENCOUNTER — Other Ambulatory Visit (HOSPITAL_COMMUNITY): Payer: Self-pay | Admitting: Family Medicine

## 2023-11-10 DIAGNOSIS — Z13820 Encounter for screening for osteoporosis: Secondary | ICD-10-CM

## 2023-11-19 ENCOUNTER — Other Ambulatory Visit (HOSPITAL_COMMUNITY): Payer: Medicare Other

## 2023-11-23 ENCOUNTER — Ambulatory Visit (HOSPITAL_COMMUNITY)
Admission: RE | Admit: 2023-11-23 | Discharge: 2023-11-23 | Disposition: A | Discharge status: Home / Self Care | Payer: No Typology Code available for payment source | Source: Ambulatory Visit | Attending: Family Medicine | Admitting: Family Medicine

## 2023-11-23 DIAGNOSIS — Z13820 Encounter for screening for osteoporosis: Secondary | ICD-10-CM | POA: Diagnosis present

## 2023-11-23 DIAGNOSIS — M8589 Other specified disorders of bone density and structure, multiple sites: Secondary | ICD-10-CM | POA: Diagnosis not present

## 2024-02-09 ENCOUNTER — Ambulatory Visit: Payer: Medicare Other | Admitting: Cardiology

## 2024-02-22 NOTE — Progress Notes (Signed)
 Cardiology Office Note:  .   Date:  03/01/2024  ID:  Matthew [name], DOB Jan 31, 1946, MRN [id number] PCP: [facility], [facility] Providers Cardiologist:  Dina Rich, MD    History of Present Illness: .   Matthew [name] is a 78 y.o. male  with history of CAD s/p CABG 01/2023, small dissection flap in right carotid during that admission by CT, 1-39% BICA by duplex, 2.9cm infrarenal AAA by CT 01/2023, trivial pericardial effusion, aortic atherosclerosis, DM2, bipolar disorder, colon CA s/p prior surgery/chemo, COPD, arthritis, remote peptic ulcer, osteoporosis, HLD, PTSD, hypothyroidism  He was admitted 01/2023 with unstable angina with negative troponins. 2D echo 01/19/23 EF 65-70%, mild LVH, trivial pericardial effusion (the latter not felt clinically significant). Cath showed MVCAD. The day after cath he experienced some vision changes which he later reported had been present since 5 days prior. CT/MRI brain nonacute except for chronic ischemic changes, CTA chest without acute aortic pathology, CT angio head neck 2/8 reported "Small dissection flap in the superior aspect of the right carotid bulb with associated shallow pseudoaneurysm measuring 3 x 1 mm. No flow limitation or intraluminal thrombus" with otherwise nonobstructive cerebrovascular stenosis. Per IM notes, "CT angio head and neck showed calcified plaques but no hemodynamically significant stenosis." He underwent CABG on 01/23/23 with LIMA-LAD, SVG-diag, SVG-ramus, SVG-OM1. He was doing well in follow-up. He was seen by Dr. Arbie Cookey in follow-up regarding the carotid irregularity who felt this was incidental finding. He recommended carotid US + vascular f/u in 1 year (04/2024) and repeat abdominal duplex in 2-3 years to follow AAA.  Patient returns with his wife.Denies chest pain, dyspnea, palpitations, edema. Works his farms and stays active outside.     ROS:    Studies Reviewed: Marland Kitchen    EKG  Interpretation Date/Time:  Tuesday March 01 2024 11:39:43 EDT Ventricular Rate:  102 PR Interval:  168 QRS Duration:  80 QT Interval:  344 QTC Calculation: 448 R Axis:   -38  Text Interpretation: Sinus tachycardia Left axis deviation Low voltage QRS Inferior infarct , age undetermined When compared with ECG of 24-Jan-2023 06:52, QRS axis Shifted left Inferior infarct is now Present Nonspecific T wave abnormality, improved in Lateral leads Confirmed by Jacolyn Reedy [phone]) on 03/01/2024 11:45:36 AM    Prior CV Studies:   Echo intra-op 01/23/23 POST-OP IMPRESSIONS  _ Left Ventricle: The left ventricle is unchanged from pre-bypass.  _ Right Ventricle: The right ventricle appears unchanged from pre-bypass.  _ Aorta: The aorta appears unchanged from pre-bypass.  _ Left Atrium: The left atrium appears unchanged from pre-bypass.  _ Left Atrial Appendage: The left atrial appendage appears unchanged from  pre-bypass.  _ Aortic Valve: The aortic valve appears unchanged from pre-bypass.  _ Mitral Valve: The mitral valve appears unchanged from pre-bypass.  _ Tricuspid Valve: The tricuspid valve appears unchanged from pre-bypass.  _ Pulmonic Valve: The pulmonic valve appears unchanged from pre-bypass.  _ Interatrial Septum: The interatrial septum appears unchanged from  pre-bypass.  _ Interventricular Septum: The interventricular septum appears unchanged  from  pre-bypass.  _ Pericardium: The pericardium appears unchanged from pre-bypass.  _ Comments: Good LV function post bypass  EF>60%.    Risk Assessment/Calculations:             Physical Exam:   VS:  BP 110/70 (Cuff Size: Normal)   Pulse 98   Ht 5\' 8"  (1.727 m)   Wt 82 kg   SpO2 91%   BMI  27.49 kg/m    Wt Readings from Last 3 Encounters:  03/01/24 82 kg  08/06/23 82.6 kg  04/15/23 83 kg    GEN: Well nourished, well developed in no acute distress NECK: No JVD; No carotid bruits CARDIAC:  RRR, no murmurs, rubs,  gallops RESPIRATORY:  Clear to auscultation without rales, wheezing or rhonchi  ABDOMEN: Soft, non-tender, non-distended EXTREMITIES:  No edema; No deformity   ASSESSMENT AND PLAN: .    CAD S/P CABG 01/2023-no angina-continue ASA, lipitor, lisinoptril, toprol. Labs done at the [place] recently.   HLD-lipids reviewed on [facility] 07/2023 and normal. Lab work at the [place] one month ago.  Carotid art diease/AAA on ASA/statin followed by [facility]-needs to make appt with Dr. Arbie Cookey.  [facility]        Dispo: f/u in 1 yr.  Signed, Jacolyn Reedy, PA-C

## 2024-03-01 ENCOUNTER — Encounter: Payer: Self-pay | Admitting: Physician Assistant

## 2024-03-01 ENCOUNTER — Ambulatory Visit: Payer: Medicare Other | Attending: Physician Assistant | Admitting: Physician Assistant

## 2024-03-01 VITALS — BP 110/70 | HR 98 | Ht 68.0 in | Wt 180.8 lb

## 2024-03-01 DIAGNOSIS — I779 Disorder of arteries and arterioles, unspecified: Secondary | ICD-10-CM | POA: Diagnosis not present

## 2024-03-01 DIAGNOSIS — E785 Hyperlipidemia, unspecified: Secondary | ICD-10-CM | POA: Diagnosis not present

## 2024-03-01 DIAGNOSIS — I2510 Atherosclerotic heart disease of native coronary artery without angina pectoris: Secondary | ICD-10-CM | POA: Diagnosis not present

## 2024-03-01 DIAGNOSIS — E119 Type 2 diabetes mellitus without complications: Secondary | ICD-10-CM | POA: Insufficient documentation

## 2024-03-01 NOTE — Patient Instructions (Signed)
 Medication Instructions:  Your physician recommends that you continue on your current medications as directed. Please refer to the Current Medication list given to you today.  Please call Dr. Arbie Cookey at [phone] for an appointment   *If you need a refill on your cardiac medications before your next appointment, please call your pharmacy*   Lab Work: NONE   If you have labs (blood work) drawn today and your tests are completely normal, you will receive your results only by: MyChart Message (if you have MyChart) OR A paper copy in the mail If you have any lab test that is abnormal or we need to change your treatment, we will call you to review the results.   Testing/Procedures: NONE     Follow-Up: At [facility], you and your health needs are our priority.  As part of our continuing mission to provide you with exceptional heart care, we have created designated Provider Care Teams.  These Care Teams include your primary Cardiologist (physician) and Advanced Practice Providers (APPs -  Physician Assistants and Nurse Practitioners) who all work together to provide you with the care you need, when you need it.  We recommend signing up for the patient portal called "MyChart".  Sign up information is provided on this After Visit Summary.  MyChart is used to connect with patients for Virtual Visits (Telemedicine).  Patients are able to view lab/test results, encounter notes, upcoming appointments, etc.  Non-urgent messages can be sent to your provider as well.   To learn more about what you can do with MyChart, go to [email or web address].    Your next appointment:   1 year(s)  Provider:   You may see Dina Rich, MD or one of the following Advanced Practice Providers on your designated Care Team:   Randall An, PA-C  Jacolyn Reedy, [place]     Other Instructions Thank you for choosing [facility]!

## 2024-03-02 ENCOUNTER — Telehealth: Payer: Self-pay | Admitting: Physician Assistant

## 2024-03-02 NOTE — Telephone Encounter (Signed)
 Per OV note from provider:  Carotid art diease/AAA on ASA/statin followed by [facility]-needs to make appt with Dr. Arbie Cookey.   Patient notified to call Dr. Bosie Helper office in regards to carotid studies. Pt had no further questions or concerns at this time.

## 2024-03-02 NOTE — Telephone Encounter (Signed)
 Pt requesting cb to discuss testing needed carotid US?? As discussed at ov yesterday per pt

## 2024-03-22 DIAGNOSIS — E1165 Type 2 diabetes mellitus with hyperglycemia: Secondary | ICD-10-CM | POA: Diagnosis not present

## 2024-03-22 DIAGNOSIS — I10 Essential (primary) hypertension: Secondary | ICD-10-CM | POA: Diagnosis not present

## 2024-03-22 DIAGNOSIS — E039 Hypothyroidism, unspecified: Secondary | ICD-10-CM | POA: Diagnosis not present

## 2024-04-14 DIAGNOSIS — K219 Gastro-esophageal reflux disease without esophagitis: Secondary | ICD-10-CM | POA: Diagnosis not present

## 2024-04-14 DIAGNOSIS — E785 Hyperlipidemia, unspecified: Secondary | ICD-10-CM | POA: Diagnosis not present

## 2024-04-14 DIAGNOSIS — Z85038 Personal history of other malignant neoplasm of large intestine: Secondary | ICD-10-CM | POA: Diagnosis not present

## 2024-04-14 DIAGNOSIS — E1142 Type 2 diabetes mellitus with diabetic polyneuropathy: Secondary | ICD-10-CM | POA: Diagnosis not present

## 2024-04-14 DIAGNOSIS — E1165 Type 2 diabetes mellitus with hyperglycemia: Secondary | ICD-10-CM | POA: Diagnosis not present

## 2024-04-14 DIAGNOSIS — N400 Enlarged prostate without lower urinary tract symptoms: Secondary | ICD-10-CM | POA: Diagnosis not present

## 2024-04-14 DIAGNOSIS — R000 Tachycardia, unspecified: Secondary | ICD-10-CM | POA: Diagnosis not present

## 2024-04-14 DIAGNOSIS — I10 Essential (primary) hypertension: Secondary | ICD-10-CM | POA: Diagnosis not present

## 2024-04-14 DIAGNOSIS — Z87891 Personal history of nicotine dependence: Secondary | ICD-10-CM | POA: Diagnosis not present

## 2024-04-14 DIAGNOSIS — E039 Hypothyroidism, unspecified: Secondary | ICD-10-CM | POA: Diagnosis not present

## 2024-04-14 DIAGNOSIS — F331 Major depressive disorder, recurrent, moderate: Secondary | ICD-10-CM | POA: Diagnosis not present

## 2024-06-21 ENCOUNTER — Other Ambulatory Visit (HOSPITAL_COMMUNITY): Payer: Self-pay | Admitting: Family Medicine

## 2024-06-21 DIAGNOSIS — M79672 Pain in left foot: Secondary | ICD-10-CM

## 2024-06-21 DIAGNOSIS — G894 Chronic pain syndrome: Secondary | ICD-10-CM | POA: Diagnosis not present

## 2024-07-20 DIAGNOSIS — Z7689 Persons encountering health services in other specified circumstances: Secondary | ICD-10-CM | POA: Diagnosis not present

## 2024-08-10 DIAGNOSIS — R972 Elevated prostate specific antigen [PSA]: Secondary | ICD-10-CM | POA: Diagnosis not present

## 2024-08-10 DIAGNOSIS — E785 Hyperlipidemia, unspecified: Secondary | ICD-10-CM | POA: Diagnosis not present

## 2024-08-10 DIAGNOSIS — E119 Type 2 diabetes mellitus without complications: Secondary | ICD-10-CM | POA: Diagnosis not present

## 2024-08-10 DIAGNOSIS — I10 Essential (primary) hypertension: Secondary | ICD-10-CM | POA: Diagnosis not present
